# Patient Record
Sex: Female | Born: 1945 | Race: White | Hispanic: No | Smoking: Never smoker
Health system: Southern US, Community
[De-identification: ages and names within clinical notes are randomized; demographics above are authoritative.]

## PROBLEM LIST (undated history)

## (undated) DIAGNOSIS — G309 Alzheimer's disease, unspecified: Secondary | ICD-10-CM

## (undated) DIAGNOSIS — M948X9 Other specified disorders of cartilage, unspecified sites: Secondary | ICD-10-CM

## (undated) DIAGNOSIS — G250 Essential tremor: Secondary | ICD-10-CM

## (undated) DIAGNOSIS — F0280 Dementia in other diseases classified elsewhere without behavioral disturbance: Secondary | ICD-10-CM

## (undated) DIAGNOSIS — T8859XA Other complications of anesthesia, initial encounter: Secondary | ICD-10-CM

## (undated) DIAGNOSIS — M6530 Trigger finger, unspecified finger: Secondary | ICD-10-CM

## (undated) DIAGNOSIS — G243 Spasmodic torticollis: Secondary | ICD-10-CM

## (undated) DIAGNOSIS — N809 Endometriosis, unspecified: Secondary | ICD-10-CM

## (undated) DIAGNOSIS — E876 Hypokalemia: Secondary | ICD-10-CM

## (undated) DIAGNOSIS — F32A Depression, unspecified: Secondary | ICD-10-CM

## (undated) DIAGNOSIS — R251 Tremor, unspecified: Secondary | ICD-10-CM

## (undated) DIAGNOSIS — G252 Other specified forms of tremor: Secondary | ICD-10-CM

## (undated) DIAGNOSIS — M159 Polyosteoarthritis, unspecified: Secondary | ICD-10-CM

## (undated) DIAGNOSIS — E785 Hyperlipidemia, unspecified: Secondary | ICD-10-CM

## (undated) DIAGNOSIS — I10 Essential (primary) hypertension: Secondary | ICD-10-CM

## (undated) DIAGNOSIS — F329 Major depressive disorder, single episode, unspecified: Secondary | ICD-10-CM

## (undated) DIAGNOSIS — K219 Gastro-esophageal reflux disease without esophagitis: Secondary | ICD-10-CM

## (undated) DIAGNOSIS — T4145XA Adverse effect of unspecified anesthetic, initial encounter: Secondary | ICD-10-CM

## (undated) HISTORY — PX: TONSILLECTOMY: SUR1361

## (undated) HISTORY — PX: CATARACT EXTRACTION: SUR2

## (undated) HISTORY — DX: Depression, unspecified: F32.A

## (undated) HISTORY — DX: Dementia in other diseases classified elsewhere, unspecified severity, without behavioral disturbance, psychotic disturbance, mood disturbance, and anxiety: F02.80

## (undated) HISTORY — PX: TOTAL ABDOMINAL HYSTERECTOMY: SHX209

## (undated) HISTORY — DX: Tremor, unspecified: R25.1

## (undated) HISTORY — DX: Hypokalemia: E87.6

## (undated) HISTORY — PX: TOTAL KNEE ARTHROPLASTY: SHX125

## (undated) HISTORY — PX: COLON SURGERY: SHX602

## (undated) HISTORY — DX: Alzheimer's disease, unspecified: G30.9

## (undated) HISTORY — PX: APPENDECTOMY: SHX54

## (undated) HISTORY — PX: WISDOM TOOTH EXTRACTION: SHX21

## (undated) HISTORY — PX: JOINT REPLACEMENT: SHX530

## (undated) HISTORY — DX: Essential tremor: G25.0

## (undated) HISTORY — DX: Major depressive disorder, single episode, unspecified: F32.9

## (undated) HISTORY — DX: Other specified disorders of cartilage, unspecified sites: M94.8X9

## (undated) HISTORY — DX: Polyosteoarthritis, unspecified: M15.9

## (undated) HISTORY — DX: Gastro-esophageal reflux disease without esophagitis: K21.9

## (undated) HISTORY — DX: Other specified forms of tremor: G25.2

## (undated) HISTORY — DX: Endometriosis, unspecified: N80.9

## (undated) HISTORY — PX: CHOLECYSTECTOMY: SHX55

## (undated) HISTORY — PX: FOOT SURGERY: SHX648

---

## 1998-12-02 ENCOUNTER — Encounter: Payer: Self-pay | Admitting: *Deleted

## 1998-12-02 ENCOUNTER — Encounter: Admission: RE | Admit: 1998-12-02 | Discharge: 1998-12-02 | Payer: Self-pay | Admitting: *Deleted

## 1999-03-13 ENCOUNTER — Ambulatory Visit (HOSPITAL_BASED_OUTPATIENT_CLINIC_OR_DEPARTMENT_OTHER): Admission: RE | Admit: 1999-03-13 | Discharge: 1999-03-13 | Payer: Self-pay | Admitting: Orthopedic Surgery

## 2000-01-28 ENCOUNTER — Encounter: Payer: Self-pay | Admitting: Orthopedic Surgery

## 2000-01-29 ENCOUNTER — Inpatient Hospital Stay (HOSPITAL_COMMUNITY): Admission: RE | Admit: 2000-01-29 | Discharge: 2000-02-03 | Payer: Self-pay | Admitting: Orthopedic Surgery

## 2002-02-02 LAB — HM COLONOSCOPY

## 2002-10-10 ENCOUNTER — Encounter: Payer: Self-pay | Admitting: Surgery

## 2002-10-16 ENCOUNTER — Observation Stay (HOSPITAL_COMMUNITY): Admission: RE | Admit: 2002-10-16 | Discharge: 2002-10-17 | Payer: Self-pay | Admitting: Surgery

## 2002-10-16 ENCOUNTER — Encounter (INDEPENDENT_AMBULATORY_CARE_PROVIDER_SITE_OTHER): Payer: Self-pay | Admitting: Specialist

## 2003-04-13 ENCOUNTER — Ambulatory Visit (HOSPITAL_COMMUNITY): Admission: RE | Admit: 2003-04-13 | Discharge: 2003-04-13 | Payer: Self-pay | Admitting: Gastroenterology

## 2008-01-31 ENCOUNTER — Inpatient Hospital Stay (HOSPITAL_COMMUNITY): Admission: RE | Admit: 2008-01-31 | Discharge: 2008-02-03 | Payer: Self-pay | Admitting: Orthopedic Surgery

## 2008-09-11 ENCOUNTER — Encounter
Admission: RE | Admit: 2008-09-11 | Discharge: 2008-09-11 | Payer: Self-pay | Admitting: Physical Medicine and Rehabilitation

## 2009-11-22 LAB — HM DEXA SCAN

## 2010-05-19 LAB — CBC
HCT: 26.1 % — ABNORMAL LOW (ref 36.0–46.0)
Hemoglobin: 9.1 g/dL — ABNORMAL LOW (ref 12.0–15.0)
MCHC: 34.9 g/dL (ref 30.0–36.0)
MCV: 92.4 fL (ref 78.0–100.0)
Platelets: 204 10*3/uL (ref 150–400)
RBC: 2.82 MIL/uL — ABNORMAL LOW (ref 3.87–5.11)
RDW: 14.5 % (ref 11.5–15.5)
WBC: 4.6 10*3/uL (ref 4.0–10.5)

## 2010-06-17 NOTE — Op Note (Signed)
Alyssa Macdonald, Alyssa Macdonald               ACCOUNT NO.:  [id number]   MEDICAL RECORD NO.:  [id number]          PATIENT TYPE:  INP   LOCATION:  0005                         FACILITY:  [facility]   PHYSICIAN:  Deidre Ala, M.D.    DATE OF BIRTH:  06-06-1945   DATE OF PROCEDURE:  01/31/2008  DATE OF DISCHARGE:                               OPERATIVE REPORT   PREOPERATIVE DIAGNOSIS:  End-stage degenerative joint disease, right  knee.   POSTOPERATIVE DIAGNOSIS:  End-stage degenerative joint disease, right  knee.   PROCEDURE:  Right total knee arthroplasty using cemented DePuy  components, LCS type with rotating platform and MVT revision stem.   SURGEON:  1. Charlesetta Shanks, M.D.   ASSISTANT:  Phineas Semen, PA-C.   ANESTHESIA:  Spinal.   CULTURES:  None.   DRAINS:  Two medium Hemovacs to self suction.   ESTIMATED BLOOD LOSS:  Less than 100 mL, replaced without.   TOURNIQUET TIME:  64 minutes.   PATHOLOGIC FINDINGS AND HISTORY:  Alyssa Macdonald is a 64 year old female with  end-stage DJD right knee, painful to the point of incapacitation.  The  left knee is status post about 5 years a total knee and has been  reasonably successful for her.  At surgery the right knee had end-stage  tricompartmental disease.  We did fit her with a standard plus right  femur, a standard plus rotating platform, 10 mm oval dome three peg all-  poly patella, 32 mm and MVT revision cemented tray size 4 and a  universal stem fluted 75 x 14.  We got full extension with flexion to  about 120 degrees with excellent ligament balance.   DESCRIPTION OF PROCEDURE:  With adequate anesthesia obtained using  spinal technique and 2 grams Ancef given IV prophylaxis, the patient was  placed in the supine position.  The right lower extremity was prepped  from the toes to the tourniquet in standard fashion.  After standard  prepping and draping, Esmarch examination was used and the tourniquet  was let up to 350 mmHg.  A right knee  median parapatellar skin incision  was followed by a median parapatellar retinacular incision.  The  incision was deepened sharply with a knife and hemostasis obtained using  the Bovie electrocoagulator.  Dissection to the retinaculum which was  incised.  The patella was then everted.  The fat pad excised, both  menisci and the cruciates.  We then amputated the tibial spines and  proceeded to ream up to a 16 for the intramedullary guide, 0 degree on  the tibia.  The tibial cutting guide was put in place with the  appropriate alignment and then cut made.  I cut 2.5 more mm because I  felt the flexion gap would be tight.  I then sized the femur to a  standard plus and placed the intramedullary guide.  I set it with a 10.5  C clamp and pinned it.  I then made the anterior-posterior cuts and fit  the 10 mm in flexion.  I then placed a 4 degree distal valgus femoral  cutting jig in place  and made that cut on the first mark, allowing 10 mm  in flexion and extension with the flexion/extension gap.  The finishing  guide was then placed in the femur with anterior-posterior chamfer cuts,  as well as a notch cut made.  I then exposed the tibia and reamed down  with a conical reamer.  I felt the 14 stem fit best.  I placed a trial  and impacted it with the keel punch.  I then trialed a 10-mm insert  after articulating the distal femur with the femoral trial.  The knee  had excellent fit and fill with full range of motion.  I then sized the  patella to a 25.  I used the round reamer to cut it down 8-mm to fit for  a 32 button, placed the three peg drill guide, made those holes and then  placed the patellar component on for trialing.  All trial components  were then removed.  I thoroughly jet lavaged the knee and checked the  components for sizing as they came on the field.  The tibial stem was  then assembled on the tibia.  We then mixed cement in a cement gun with  vancomycin.  We then cemented on the  tibial component, impacted it and  removed excess cement.  We then placed a rotating platform 10 mm.  We  then cemented on the femoral component, impacted it and removed excess  cement and then held the knee in full extension and removed excess  cement.  We then cemented on the patellar component, impacted it,  removed excess cement and held it until the cement cured.  When the  cement had cured, again jet lavage was carried out on the remainder of  the irrigant.  We then let the tourniquet down.  Bleeding points were  cauterized.  FloSeal was placed in the wound.  Hemovac drains were  placed in the medial and lateral gutter and brought out the  superolateral portal.  The wound was then closed in layers with #1  Vicryl on the retinaculum, 0 and 2-0 Vicryl on the subcu and skin  staples.  Hemovac was hooked up to self suction.  A bulky sterile  compressive dressing was applied with Ace and knee immobilizer.  The  patient having tolerated the procedure well was awakened and taken to  recovery room in satisfactory condition to be admitted for routine  postoperative care, CPM and analgesia.      Deidre Ala, M.D.  Electronically Signed     VEP/MEDQ  D:  01/31/2008  T:  01/31/2008  Job:  [id number]   cc:   Birdena Jubilee, MD  Fax: [phone]

## 2010-06-17 NOTE — Discharge Summary (Signed)
Alyssa Macdonald, Alyssa Macdonald               ACCOUNT NO.:  [id number]   MEDICAL RECORD NO.:  [id number]          PATIENT TYPE:  INP   LOCATION:  1619                         FACILITY[facility]   PHYSICIAN:  Deidre Ala, M.D.    DATE OF BIRTH:  [birth year]   DATE OF ADMISSION:  01/31/2008  DATE OF DISCHARGE:  02/03/2008                               DISCHARGE SUMMARY   FINAL DIAGNOSES:  1. Degenerative joint disease right knee with chronic pain.  2. Postoperative blood loss anemia, stable.  3. Hypertension.  4. Hyperlipidemia.  5. History of pancreatitis.   PROCEDURE:  On January 31, 2008, right total knee arthroplasty.   SURGEON:  1. Charlesetta Shanks, M.D.   HISTORY:  This is a 65 year old Caucasian female, patient of Dr. Ollen Gross  who is followed by him for degenerative joint disease of the right knee  with chronic pain.  She had failed conservative management and was felt  to be ready and stable for surgery.  On January 31, 2008, the patient  was taken to the operating room where  she underwent a total knee  arthroplasty on the right knee with Dupuy LCS with Smith&Nephew P-  stemand rotating platform.  The patient tolerated the procedure well.  No intraoperative complications occurred.  Postoperatively, the patient  did very well.  No untoward events occurred during her stay.  She did  suffer from postop blood loss anemia but required no blood products.  She was started on medication as she was taking at home.  Her pain was  controlled well with PCA pump with morphine and oxycodone.  She was  working with physical therapy.  She was up and out of bed.  By the third  postoperative day, she was able to get out of bed without assistance and  at this time she will be prepared for discharge.   DISCHARGE MEDICATIONS:  1. Lovenox 30 mg 1 subcu q.24 h.times 10 days.  2. Percocet 5/325 one-to-two p.o. q.4-6 h., p.r.n. for pain, #60 with      no refills.  3. Robaxin 750 one p.o. q.i.d. p.r.n. for  muscle spasm, #40 with no      refills.  4. She was to continue on her other medications as she was taking at      home which was hydrochlorothiazide 25 mg 1/2 tablet p.o. daily.  5. Propranolol 10 mg 1 p.o. daily.  6. Lipitor 10 mg 1 p.o. daily.  7. Potassium chloride 20 mEq 1/2 tablet one time a day.  8. Fosamax 70 mg one time per week.  9. Lovaza 1 gram two times a day.  10.Fish oil.  11.She was to continue to take aspirin 81 mg daily.  12.Omeprazole 20 mg two times a day.  13.Amitriptyline 25 mg p.o. h.s. for leg pain.   ALLERGIES:  NO KNOWN DRUG ALLERGIES.   The patient is subsequently scheduled for discharge on February 03, 2008.  At this time, her lab work looked good.  Her last hemoglobin and  hematocrit on the day before discharge was 10.3 and 30.2.  WBC was 9.9.  Platelets 295,000.  She had mild hyponatremia which was 133.  Potassium  3.7, chloride 100, CO2 of 27, BUN 8, creatinine 0.67, glucose 136,  calcium 8.7.   The patient is doing well and will be discharged home on February 03, 2008, in satisfactory and stable condition.  She is to follow up with  Dr. Renae Fickle in 10 days.      Phineas Semen, Wynonia Hazard, M.D.  Electronically Signed    CL/MEDQ  D:  02/02/2008  T:  02/02/2008  Job:  [id number]

## 2010-06-20 NOTE — Op Note (Signed)
[facility]  Patient:    Alyssa Macdonald, Alyssa Macdonald                      MRN: [id number] Proc. Date: 03/13/99 Adm. Date:  [id number] Attending:  Drema Pry CC:         Jearld Adjutant, M.D.             Jamesetta Geralds, M.D.                           Operative Report  PREOPERATIVE DIAGNOSIS:  Left knee medial meniscus tear.  POSTOPERATIVE DIAGNOSIS: 1. Degenerative posterior horn medial meniscus tear with displaceable flap. 2. Degenerative joint disease grade 3 to 4 medial femoral condyle, medial tibial    plateau, lateral femoral condyle and posterior patella. 3. Large synovitic and fibrous plicas medial and lateral and _________    synovitis. 4. Tight lateral retinaculum. 5. Stretched anterior cruciate ligament with end point. 6. Degenerative fraying, lateral meniscus.  OPERATION PERFORMED: 1. Left knee operative arthroscopy with posterior medial meniscectomy. 2. Shaving, lateral meniscus. 3. Abrasion chondroplasty and ablation medial femoral condyle, medial tibial    plateau, posterior patella, lateral femoral condyle. 4. Synovectomy, medial and lateral plicas and synovium. 5. Shrinking anterior cruciate ligament with ablation probe. 6. Arthroscopic lateral retinacular release.  SURGEON:  Jearld Adjutant, M.D.  ASSISTANT:  Currie Paris. Thedore Mins.  ANESTHESIA:  General with LMA.  CULTURES:  None.  DRAINS:  None.  ESTIMATED BLOOD LOSS:  Minimal.  TOURNIQUET TIME:  Without.  PATHOLOGIC FINDINGS AND HISTORY:  Alyssa Macdonald is a 65 year old female who was seen February 08, 1999 with a consultation at the request of Dr. Tery Sanfilippo at [facility] for evaluation of acute knee pain in her left knee present for ix weeks prior but worsening to the point where she can not ambulate on it.  She had had a knee immobilizer.  The pain is constant, hurt with certain movements. 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  She had tenderness in the  medial and lateral joint lines, Lachmans was negative, not a significant effusion.  X-ray showed degenerative changes media compartment narrowing which is mild but certainly a ong way from bone-on-bone, spurring off the tibial spine.  At that point, we felt she had DJD with a possible medial meniscus tear and Bakers cyst and we elected to inject the knee with cortisone and Marcaine.  We put in Neoprene knee sleeves and Mepergan Fortis for the severe pain.  She came back February 17, 1999.  She was getting a little popping and grinding but no locking, catching or giving way and overall felt much better.  We felt she had improvement of her pain after cortisone injection but did feel she could have a degenerative medial meniscus tear with  Bakers cyst present even though better clinically.  We put her on Celebrex, brought her back March 03, 1999.  The knee was still hurting with any type of  lateral motion or twisting motion medially.  No catching or locking but does have a sharp pain medially when she squats, which she really can not do.  She has been  working as a Diplomatic Services operational officer at [employer] but states that even doing light duty work she was having difficulty.  Exam was consistent with worsening of symptoms of probable degenerative medial meniscus tear and Bakers cyst.  At this point we discussed  options and she desired to proceed with arthroscopy.  At surgery, we found a posterior medial meniscus tear with cleavage between the far posterior horn and the rest of the medial meniscus that was displaceable by probing into the joint. This was her most symptomatic site.  She had grade 3 to 4 changes on the posterior medial femoral condyle, grade 2 to 3 changes on the medial tibial plateau.  Grade 3 changes on a dime-sized area on the lateral femoral condyle. The medial femoral condyle was about half-dollar size.  There was some spurring on he medial femoral condylar  runner.  The trochlea actually looked fairly good with nly grade 1 changes.  She had pouch synovitis and parapatellar synovitis that was fairly extensive in the medial and lateral gutters with underlying plicas.  She had a tight lateral retinaculum and her posterior patella was grade 3 to 4 degenerative joint disease over the most of the lateral 75% of the posterior patella.  The ACL was intact but stretched forward about 5 mm, then had an end point.  She did have degenerative fraying of the lateral meniscus.  Therefore, we did the procedures as above with posterior medial meniscectomy back to a far stable rim and tapering leaving the anterior medial meniscus.  We also shaved off some synovium from the anterior medial meniscus.  The lateral meniscus was shaved.  We then did abrasion chondroplasties and ablation smoothing of the medial femoral condyle, medial tibial plateau, lateral femoral condyle and posterior patella.  We did a lateral retinacular retinacular release, shaved out the synovitis, medial and lateral gutters back to the side wall including the underlying fibrous band and did the  ablator on shrink to tighten up the ACL.  DESCRIPTION OF PROCEDURE:  After general anesthesia was obtained using LMA technique, the patient was placed in supine position.  The left leg was fixed in a legholder.  After standard prepping and draping of the left knee from the malleoli to the leg holder, a superolateral inflow portal was made.  The knee was insufflated with normal saline with the arthroscopic pump.  Medial and lateral scope portals were then made.  The joint was thoroughly inspected and probed. e isolated the posterior medial meniscus and used basket and shaver and ablator to saucerize it completely back to a stable rim with no unstable flaps including the far posterior horn down around the posterior one quarter, then tapering it around from there to the front.  Synovitis  was shaved off the anterior medial meniscus.  I then shaved and lightly used the ablator on low on the medial femoral condyle,  medial tibial plateau to smooth it.  I then shaved out the medial gutter synovitis back to the side wall and lysed the medial band and cauterized bleeding points.  The lateral meniscus was then shaved.  I shaved on the lateral femoral condyle defect and used the ablator there to smooth it.  I reversed portals, shaved out the lateral gutter synovitis and shaved and used the ablator on the posterior patella and did an arthroscopic lateral retinacular release from the vastus lateralis to the joint line nicely decompressive with the patellofemoral joint on the lateral side improving tracking.  I then used the ablator on the ACL, shrinking that down. When I was satisfied with resection and the procedure, the knee was irrigated through the scope and 0.5% Marcaine was injected in and about the portals.  The  portals were left open.  A bulky sterile compressive  dressing was applied with lateral foam pad for tamponade.  An E-Z wrap was placed over Ace compressive dressing. The patient then having tolerated the procedure well was awakened and  taken to the recovery room in satisfactory condition for routine postoperative are to be discharged per outpatient routine and told to call the office for appointment for recheck on Saturday, given Tylox for pain. DD:  03/13/99 TD:  03/13/99 Job: [id number]

## 2010-06-20 NOTE — Op Note (Signed)
NAME:  Alyssa Macdonald, Alyssa Macdonald                         ACCOUNT NO.:  [id number]   MEDICAL RECORD NO.:  [id number]                   PATIENT TYPE:  AMB   LOCATION:  DAY                                  FACILITY:  [facility]   PHYSICIAN:  Sandria Bales. Ezzard Standing, M.D.               DATE OF BIRTH:  [birth year]   DATE OF PROCEDURE:  10/16/2002  DATE OF DISCHARGE:                                 OPERATIVE REPORT   PREOPERATIVE DIAGNOSIS:  Chronic cholecystitis with cholelithiasis.   POSTOPERATIVE DIAGNOSIS:  Chronic cholecystitis with cholelithiasis, with  extensive adhesions to the midline from her prior abdominal surgery.   PROCEDURE:  Laparoscopic cholecystectomy with intraoperative cholangiogram.   SURGEON:  Sandria Bales. Ezzard Standing, M.D.   FIRST ASSISTANT:  Sharlet Salina T. Hoxworth, M.D.   ANESTHESIA:  General endotracheal.   ESTIMATED BLOOD LOSS:  Minimal.   INDICATION FOR PROCEDURE:  Ms. Seki is a 65 year old white female who has  symptomatic cholelithiasis.  She has had a significant prior history of a  prior hysterectomy in 1987, and then she had a small bowel resection in  1988.  I think this has all been blamed on endometriosis.  She now comes for  attempt at a laparoscopic cholecystectomy.  The indications and potential  complications were explained to the patient.  Potential complications  include but are not limited to infection, bleeding, open bowel surgery, and  bowel and common duct injuries.   The patient was placed in a supine position, given 1 g of Ancef at the  initiation of the procedure, had the PAS stockings in place.  Her abdomen  was prepped with Betadine solution and sterilely draped.  I went  infraumbilical because she had a long midline incision.  I was able to get  into the abdominal cavity without a lot of difficulty.  I placed a 0 degree  scope through a 12 mm Hasson trocar.  The Hasson trocar was secured with a 0  Vicryl suture.  I got into the right upper quadrant, which  actually was  surprisingly free of adhesions though she had extensive adhesions to her  midline under her prior incision area.   The gallbladder was grasped, rotated cephalad, and three additional trocars  placed, a 10 mm Ethicon trocar in the subxiphoid location, a 5 mm Ethicon  trocar in the right midsubcostal, and a 5 mm Ethicon trocar in the right  lateral subcostal location.  The gallbladder was grasped, rotated cephalad.  There were some adhesions of the gallbladder to the duodenum.  There was no  other finding in the right upper quadrant that I could see.  Both the right  and left lobe of the liver were unremarkable.   The gallbladder was grasped and rotated cephalad.  The cystic duct and  cystic artery were identified.  The cystic artery was triply endoclipped and  divided.  The cystic duct was identified, a clip placed  on the gallbladder  side of the cystic duct.  An intraoperative cholangiogram was obtained.   The intraoperative cholangiogram was obtained using a cut-off Taut catheter  inserted through a 14-gauge Jelco catheter in line of the cut cystic duct.   The Taut catheter was then secured with a clip and using approximately 6 mL  of half-strength Hypaque solution, I shot an intraoperative cholangiogram  that showed free flow of contrast down the cystic duct, into the common bile  duct, into the duodenum, and up the hepatic radicles.  There was no  obstruction, no mass, no leak, and this was felt to be a normal  intraoperative cholangiogram.   Unfortunately, the x-ray techs did not save films, save the study;  therefore, I had no way of making a hard copy, but I felt comfortable enough  that the films looked entire normal to me.  I saw no reason for repeating  the exam.   The Taut catheter was then removed.  The cystic duct was triply endoclipped  and divided.  The gallbladder was then sharply and bluntly dissected from  the gallbladder bed.  There were a couple of  more clips I had to put in to  take some accessory vessels, but there was no bleeding, no bile leak either  from the triangle of Calot or the gallbladder bed as the gallbladder was  dissected off the gallbladder bed.  Prior to complete vision of the  gallbladder from the gallbladder bed, we re-examined the gallbladder bed and  the triangle of Calot and saw no bleeding or bile leak.  The gallbladder was  then divided, placed in an EndoCatch bag, and delivered through the  umbilicus.   I then irrigated the abdomen out with about 500 mL of saline.  I closed the  umbilical port with a 0 Vicryl suture, the skin at each port was closed with  a 5-0 Monocryl suture, painted with tincture of Benzoin, and steri-stripped.  The gallbladder was sent to pathology.  The patient tolerated the procedure  well, was transported to the recovery room in good condition.  The sponge  and needle count were correct at the end of the case.                                               Sandria Bales. Ezzard Standing, M.D.    DHN/MEDQ  D:  10/16/2002  T:  10/16/2002  Job:  [id number]   cc:   Birdena Jubilee, MD  [address]  [place]  [place] [address]  Fax: [phone]

## 2010-06-20 NOTE — Op Note (Signed)
NAME:  Alyssa Macdonald, GREEN                         ACCOUNT NO.:  [id number]   MEDICAL RECORD NO.:  [id number]                   PATIENT TYPE:  AMB   LOCATION:  ENDO                                 FACILITY:  [facility]   PHYSICIAN:  Anselmo Rod, M.D.               DATE OF BIRTH:  [birth year]   DATE OF PROCEDURE:  04/13/2003  DATE OF DISCHARGE:                                 OPERATIVE REPORT   PROCEDURE:  Screening colonoscopy.   ENDOSCOPIST:  Anselmo Rod, M.D.   INSTRUMENT:  Pediatric adjustable Olympus colonoscope.   INDICATIONS FOR PROCEDURE:  A 65 year old white female with a history of  bowel resection  (no records are available at this time) done for  endometriosis. Presents with rectal bleeding and change in bowel habits. To  rule out any polyps, masses, etc.   PRE-PROCEDURE PREPARATION:  Informed consent was procured from the patient.  Patient fasted for 8 hours prior to the procedure and prepped with a bottle  of magnesium citrate and a gallon of GoLYTELY the night prior to the  procedure.  Pre-procedure physical:  Patient had stable vital signs, neck  supple, chest clear to auscultation, S1/S2 regular, respirations regular,  abdomen soft with normal bowel sounds.   DESCRIPTION OF PROCEDURE:  The patient was placed in the left lateral  decubitus position, sedated with 70 mg of Demerol and 7 mg of Versed  intravenously in slow, incremental doses.  Once the patient was adequately  sedated and maintained on low flow oxygen, continuous cardiac monitoring;  the Olympus video colonoscope was advanced from the rectum to the cecum.  The appendiceal orifice and the ileocecal valve were visualized and  photographed.  A couple of scattered diverticula were seen in the sigmoid  colon.  Small internal hemorrhoids were appreciated on retroflexion in the  rectum.  No masses or polyps were identified.  The patient tolerated the  procedure well without  immediate complications.   IMPRESSION:  1. Small, non-bleeding internal hemorrhoids.  2. Few scattered diverticula in the sigmoid colon.  3. Normal descending, transverse and right colon without diverticulum.   RECOMMENDATIONS:  1. Continue on a high fiber diet.  2. Repeat colorectal cancer screening in the next 10 years unless the     patient develops abnormal symptoms in the interim.  3. Outpatient follow up in the next 2 weeks for further recommendations.                                              Anselmo Rod, M.D.   JNM/MEDQ  D:  04/13/2003  T:  04/14/2003  Job:  [id number]   cc:   Ames Coupe, M.D.

## 2010-06-20 NOTE — Discharge Summary (Signed)
[place]. [facility]  Patient:    Alyssa Macdonald, Alyssa Macdonald                      MRN: [id number] Adm. Date:  [id number] Disc. Date: [id number] Attending:  Drema Pry Dictator:   Karie Schwalbe, P.A.C. CC:         Dr. Janey Greaser, [facility]. Pract.   Discharge Summary  DIAGNOSIS:  Left knee end-stage degenerative joint disease.  PROCEDURE:  Left total knee arthroplasty using cemented DePuy components with rotating platform by Jearld Adjutant, M.D., on January 29, 2000.  MEDICATIONS AT DISCHARGE: 1. Percocet for pain. 2. Coumadin per pharmacy dosing. 3. All medications at home as before.  DISPOSITION:  The patient is discharged to home with home health nursing and physical therapy with instructions for weightbearing as tolerated.  She will be maintained on Coumadin for a total of four weeks.  She was to return to the office for follow-up one and a half weeks after discharge for staple removal.  HOSPITAL COURSE:  This is a 65 year old female with a history of left knee degenerative joint disease with recurrent pain after arthroscopy and Hyalgan injections.  Prior to surgery she had pain at night, pain with every step and was unable to walk a city block.  X-ray showed bone on bone in the medial compartment of her left knee.  She was taken to the operating room on January 29, 2000, with total knee arthroplasty performed as above. Postoperative course was uneventful.  She was anticoagulated with Coumadin. She advanced well with physical therapy and occupational therapy.  She was able to be discharged home on postoperative day #5 with medications and instructions as above. DD:  03/06/00 TD:  03/08/00 Job: [id number]

## 2010-06-20 NOTE — Op Note (Signed)
[place]. [facility]  Patient:    Alyssa Macdonald, Alyssa Macdonald                      MRN: [id number] Adm. Date:  [id number] Attending:  Drema Pry                           Operative Report  NO DICTATION DD:  01/29/00 TD:  01/29/00 Job: [id number]

## 2010-06-20 NOTE — Op Note (Signed)
Alyssa Macdonald. [facility], [facility]  Patient:    Alyssa Macdonald, Alyssa Macdonald                      MRN: [id number] Proc. Date: 01/29/00 Adm. Date:  [id number] Attending:  Drema Pry CC:         Dr. Rosine Abe, [facility]  Alyssa Macdonald, M.D.   Operative Report  PREOPERATIVE DIAGNOSIS:  End-stage degenerative joint disease, left knee.  POSTOPERATIVE DIAGNOSIS:  End-stage degenerative joint disease, left knee.  PROCEDURE:  Left total knee arthroplasty using cemented DePuy components with rotating platform.  SURGEON:  Jearld Adjutant, M.D.  ASSISTANT:  Will _____, P.A.-C.  ANESTHESIA:  General endotracheal.  CULTURES:  None.  DRAINS:  Two medium Hemovacs to Autovac.  ESTIMATED BLOOD LOSS:  100 cc.  REPLACEMENT:  Without.  TOURNIQUET TIME:  1 hour 7 minutes.  PATHOLOGIC FINDINGS AND HISTORY:  Alyssa Macdonald first presented to me at the first of last year with knee pain.  Conservative management failed, and she was taken to the operating room March 13, 1999, where she underwent left knee operative arthroscopy with posterior medial meniscus tear, degenerative, debrided, and debridement of grade 3-4 DJD of the posterior medial femoral condyle.  She also had a somewhat stretched ACL but did have an end point. She was treated conservatively postoperatively and initially did well but continued to have pain later on.  Ultimately went with a series of Hyalgan injections, which ultimately failed with pain with every step, night pain, cannot walk more than a city block, and decided to go ahead with surgery. X-ray showed bone on bone in the medial compartment with an osteophyte, although very smooth and congruous.  At this point we had a discussion, and she decided November 24, 1999, to proceed with total knee arthroplasty over the Christmas holidays.  At surgery, the knee was much worse than x-rays or her arthroscopy showed, with bone on bone,  marked osteophytes in the medial compartment and actually throughout the joint.  Patellofemoral joint was totally degenerated.  We sized her to a standard plus femur, a standard plus patella, a large tibial tray with a 12.5 standard plus to large rotating platform.  Excellent ligament balance was obtained with full extension with flexion to 110 degrees.  We used two batches of cement with 750 mg of Zinacef per batch.  Will _____ assisted.  DESCRIPTION OF PROCEDURE:  With adequate anesthesia obtained using endotracheal technique, 1 g Ancef given for IV prophylaxis and another one at tourniquet let-down, the patient was placed in the supine position.  The left lower extremity was prepped from the toes to the tourniquet in the standard fashion.  After standard prepping and draping, Esmarch exsanguination was used, and the tourniquet was let to 350 mm and later 375 mmHg.  Median parapatellar incision was made and incision deepened sharply with a knife, and median parapatellar retinacular incision was made.  We then everted the patella, removed soft tissues from the proximal tibia, did a medial release, excised the menisci and ACL and the fat pad.  Synovitis of the pouch was also excised as well as the perimeter osteophytes on tibial, patellar, and femoral surface.  The knee was then further flexed, and the anterior tibial spines were sawed flat.  Gold-tip drill was then placed down the canal, and intramedullary guide put in place and the tibial cutting jig put in place just below the medial lip.  Tibial cut  was made.  We then sized to a standard plus femur with the appropriate rotation set with the 17.5 block.  It was pinned, anterior-posterior cuts made, and then it fit a 12.5 block, as sometimes happens with downsizing from the original lollipop flexion cutting block.  In any case, this is what we were aiming for, was a 12.5.  There was a little bit of give on varus-valgus and flexion, which  is what we like.  We then placed a 4 degree distal femoral cutting jig in place, set it back 1 from the standard, 1 less, and made our cut, and the 12.5 fit perfectly with slight, maybe 1 degree of extension.  We then placed the anterior-posterior chamfer cutting jig on the femur, made those cuts, as well as the notch cuts, and the far posterior cut.  We then sized the tibial tray to a large, placed our template for the central peg hole, pinned it, and made our peg hole cut.  We then placed the tibial tray in place, the 12.5 rotating platform, and the femoral component, and impacted it on and articulated the knee through a full range of motion.  We then calipered the patella to a 22.  We were replacing 10.  We set the guide on 14, cut to a 12-13, and then placed the three-peg hole guide in place and made our drill holes.  The patella was then placed on, and the knee tracked well without need for lateral retinacular release.  All trial components were then removed as the knee was thoroughly jet-lavaged.  We checked components for sizing as they came on the table and mixed cement, two batches with 750 mg of Zinacef per batch, in a cement gun.  We then exposed the proximal tibia and cemented in the tibial tray in the appropriate rotation, impacted it, removed excess cement.  The 12.5 rotating platform was then put in place.  We then articulated the rotating platform under the condyles of the femur and then cemented on the femoral component, impacted it, removed excess cement.  The knee was then hyperextended, squeezing out the last cement around the edges.  This was removed, and the knee was held in 30 degrees of flexion while the cement cured.  We then cemented on the patellar component, impacted it, removed excess cement.  The knee was then articulated through a range of motion, and we felt tracking to be good.  The tourniquet was let down, bleeding points were cauterized, and initial gram of  Ancef was given.  Hemovac drains were then placed in the medial and lateral gutters and  brought out the superior lateral portal.  This was hooked to Autovac.  The knee was then closed in layers with #1 Vicryl on the medial retinaculum, 0 and 2-0 Vicryl on the subcutaneous tissue, and skin staples.  Bulky sterile compressive dressing was applied with knee immobilizer, and the patient, having tolerated the procedure well, was taken to the recovery room in satisfactory condition with a normal needle, instrument, and sponge count. The patient was then taken to the recovery room in satisfactory condition for routine postoperative care and CPM, having gotten an epidural fentanyl catheter by anesthesia at discharge from the operating room. DD:  01/29/00 TD:  01/29/00 Job: [id number]

## 2010-11-07 LAB — URINALYSIS, ROUTINE W REFLEX MICROSCOPIC
Glucose, UA: NEGATIVE mg/dL
Hgb urine dipstick: NEGATIVE
Ketones, ur: NEGATIVE mg/dL
Protein, ur: NEGATIVE mg/dL
Urobilinogen, UA: 1 mg/dL (ref 0.0–1.0)

## 2010-11-07 LAB — CBC
HCT: 30.2 % — ABNORMAL LOW (ref 36.0–46.0)
HCT: 30.8 % — ABNORMAL LOW (ref 36.0–46.0)
Hemoglobin: 10.3 g/dL — ABNORMAL LOW (ref 12.0–15.0)
MCHC: 33.9 g/dL (ref 30.0–36.0)
MCHC: 34 g/dL (ref 30.0–36.0)
MCV: 93.2 fL (ref 78.0–100.0)
MCV: 94.1 fL (ref 78.0–100.0)
MCV: 94.4 fL (ref 78.0–100.0)
Platelets: 257 10*3/uL (ref 150–400)
Platelets: 317 10*3/uL (ref 150–400)
RBC: 3.21 MIL/uL — ABNORMAL LOW (ref 3.87–5.11)
RDW: 14.7 % (ref 11.5–15.5)
WBC: 7.2 10*3/uL (ref 4.0–10.5)
WBC: 9.9 10*3/uL (ref 4.0–10.5)

## 2010-11-07 LAB — BASIC METABOLIC PANEL
BUN: 11 mg/dL (ref 6–23)
BUN: 12 mg/dL (ref 6–23)
BUN: 8 mg/dL (ref 6–23)
CO2: 31 mEq/L (ref 19–32)
Calcium: 9.3 mg/dL (ref 8.4–10.5)
Chloride: 100 mEq/L (ref 96–112)
Chloride: 98 mEq/L (ref 96–112)
Creatinine, Ser: 0.75 mg/dL (ref 0.4–1.2)
Creatinine, Ser: 0.77 mg/dL (ref 0.4–1.2)
GFR calc non Af Amer: >60 mL/min (ref >60)
GFR calc non Af Amer: >60 mL/min (ref >60)
Glucose, Bld: 113 mg/dL — ABNORMAL HIGH (ref 70–99)
Potassium: 3.6 mEq/L (ref 3.5–5.1)
Potassium: 3.7 mEq/L (ref 3.5–5.1)
Potassium: 3.7 mEq/L (ref 3.5–5.1)
Sodium: 133 mEq/L — ABNORMAL LOW (ref 135–145)

## 2010-11-07 LAB — COMPREHENSIVE METABOLIC PANEL
AST: 26 U/L (ref 0–37)
Albumin: 3.5 g/dL (ref 3.5–5.2)
BUN: 22 mg/dL (ref 6–23)
Calcium: 9.6 mg/dL (ref 8.4–10.5)
Creatinine, Ser: 0.88 mg/dL (ref 0.4–1.2)
GFR calc Af Amer: >60 mL/min (ref >60)

## 2010-11-07 LAB — APTT: aPTT: 24 seconds (ref 24–37)

## 2010-11-07 LAB — HEMOGLOBIN AND HEMATOCRIT, BLOOD: Hemoglobin: 13.7 g/dL (ref 12.0–15.0)

## 2010-11-07 LAB — URINE CULTURE

## 2010-11-07 LAB — DIFFERENTIAL
Eosinophils Relative: 2 % (ref 0–5)
Lymphocytes Relative: 23 % (ref 12–46)
Lymphs Abs: 1.7 10*3/uL (ref 0.7–4.0)
Monocytes Absolute: 0.7 10*3/uL (ref 0.1–1.0)
Neutro Abs: 4.6 10*3/uL (ref 1.7–7.7)

## 2010-11-07 LAB — TYPE AND SCREEN
ABO/RH(D): A POS
Antibody Screen: NEGATIVE

## 2010-11-07 LAB — PROTIME-INR: Prothrombin Time: 13.1 seconds (ref 11.6–15.2)

## 2012-01-14 ENCOUNTER — Other Ambulatory Visit: Payer: Self-pay | Admitting: Orthopedic Surgery

## 2012-01-21 ENCOUNTER — Encounter (HOSPITAL_BASED_OUTPATIENT_CLINIC_OR_DEPARTMENT_OTHER): Payer: Self-pay | Admitting: *Deleted

## 2012-01-21 NOTE — Progress Notes (Signed)
Pt instructed npo p mn 12/29 x meds w sip of water.  To [facility] 12/30 @ -0735.  Needs istat, ekg on arrival 

## 2012-01-31 NOTE — H&P (Signed)
Alyssa Macdonald is an 65 y.o. female.   CC / Reason for Visit: Right long finger painful triggering HPI: This patient is a 66 year old female who presents for evaluation of a problem involving her right long finger.  She reports that as long as 5 years ago her finger was triggering.  She had an injection and ultimately advice to proceed surgically.  She has not had surgery.  She received another injection 3 weeks ago, and this one did not help much.  She does wish to proceed surgically.  Past Medical History  Diagnosis Date  . Hypertension   . Hyperlipidemia   . GERD (gastroesophageal reflux disease)   . Trigger finger   . Complication of anesthesia     slow to awaken  . Cervical dystonia     Past Surgical History  Procedure Date  . Joint replacement     bilateral  . Cholecystectomy   . Abdominal hysterectomy   . Appendectomy   . Colon surgery     bowel resection  . Tonsillectomy   . Foot surgery     History reviewed. No pertinent family history. Social History:  reports that she has never smoked. She does not have any smokeless tobacco history on file. She reports that she drinks alcohol. Her drug history not on file.  Allergies: No Known Allergies  No prescriptions prior to admission    No results found for this or any previous visit (from the past 48 hour(s)). No results found.  Review of Systems  All other systems reviewed and are negative.    Height 5\' 6"  (1.676 m), weight 88.451 kg (195 lb). Physical Exam  Exam:  Vitals: Refer to EMR. Constitutional:  WD, WN, NAD HEENT:  NCAT, EOMI Neuro/Psych:  Alert & oriented to person, place, and time; appropriate mood & affect Lymphatic: No generalized UE edema or lymphadenopathy Extremities / MSK:  Both UE are normal with respect to appearance, ranges of motion, joint stability, muscle strength/tone, sensation, & perfusion except as otherwise noted:  Right long finger tender over the A1 pulley with overt locking with  each flexion cycle.  PIP flexion contracture of 15  Assessment:  Right long finger typical trigger finger  Plan: I discussed these findings with the patient as well as the details of her trigger finger release.  She would like to proceed ideally after Christmas and before the first of the year.  We will attempt to accommodate her wishes.  The details of the operative procedure were discussed with the patient.  Questions were invited and answered.  In addition to the goal of the procedure, the risks of the procedure to include but not limited to bleeding; infection; damage to the nerves or blood vessels that could result in bleeding, numbness, weakness, chronic pain, and the need for additional procedures; stiffness; the need for revision surgery; and anesthetic risks, the worst of which is death, were reviewed.  No specific outcome was guaranteed or implied.  Informed consent was obtained.  Postop pain medicines were prescribed, but a followup appointment was not made.  Alyssa Macdonald A. 01/31/2012, 5:39 PM

## 2012-02-01 ENCOUNTER — Ambulatory Visit (HOSPITAL_BASED_OUTPATIENT_CLINIC_OR_DEPARTMENT_OTHER): Payer: Medicare Other | Admitting: Anesthesiology

## 2012-02-01 ENCOUNTER — Encounter (HOSPITAL_BASED_OUTPATIENT_CLINIC_OR_DEPARTMENT_OTHER): Payer: Self-pay | Admitting: Anesthesiology

## 2012-02-01 ENCOUNTER — Encounter (HOSPITAL_BASED_OUTPATIENT_CLINIC_OR_DEPARTMENT_OTHER): Payer: Self-pay | Admitting: *Deleted

## 2012-02-01 ENCOUNTER — Ambulatory Visit (HOSPITAL_BASED_OUTPATIENT_CLINIC_OR_DEPARTMENT_OTHER)
Admission: RE | Admit: 2012-02-01 | Discharge: 2012-02-01 | Disposition: A | Discharge status: Home / Self Care | Payer: Medicare Other | Source: Ambulatory Visit | Attending: Orthopedic Surgery | Admitting: Orthopedic Surgery

## 2012-02-01 ENCOUNTER — Other Ambulatory Visit: Payer: Self-pay

## 2012-02-01 ENCOUNTER — Encounter (HOSPITAL_BASED_OUTPATIENT_CLINIC_OR_DEPARTMENT_OTHER)
Admission: RE | Disposition: A | Discharge status: Home / Self Care | Payer: Self-pay | Source: Ambulatory Visit | Attending: Orthopedic Surgery

## 2012-02-01 DIAGNOSIS — M6530 Trigger finger, unspecified finger: Secondary | ICD-10-CM | POA: Insufficient documentation

## 2012-02-01 DIAGNOSIS — I10 Essential (primary) hypertension: Secondary | ICD-10-CM | POA: Insufficient documentation

## 2012-02-01 DIAGNOSIS — Z01810 Encounter for preprocedural cardiovascular examination: Secondary | ICD-10-CM | POA: Insufficient documentation

## 2012-02-01 HISTORY — DX: Trigger finger, unspecified finger: M65.30

## 2012-02-01 HISTORY — DX: Other complications of anesthesia, initial encounter: T88.59XA

## 2012-02-01 HISTORY — DX: Spasmodic torticollis: G24.3

## 2012-02-01 HISTORY — DX: Hyperlipidemia, unspecified: E78.5

## 2012-02-01 HISTORY — DX: Essential (primary) hypertension: I10

## 2012-02-01 HISTORY — DX: Adverse effect of unspecified anesthetic, initial encounter: T41.45XA

## 2012-02-01 HISTORY — PX: TRIGGER FINGER RELEASE: SHX641

## 2012-02-01 HISTORY — DX: Gastro-esophageal reflux disease without esophagitis: K21.9

## 2012-02-01 LAB — POCT I-STAT, CHEM 8
BUN: 17 mg/dL (ref 6–23)
Calcium, Ion: 1.25 mmol/L (ref 1.13–1.30)
Hemoglobin: 14.6 g/dL (ref 12.0–15.0)
Sodium: 142 mEq/L (ref 135–145)
TCO2: 24 mmol/L (ref 0–100)

## 2012-02-01 SURGERY — RELEASE, A1 PULLEY, FOR TRIGGER FINGER
Anesthesia: Monitor Anesthesia Care | Site: Finger | Laterality: Right | Wound class: Clean

## 2012-02-01 MED ORDER — HYDROMORPHONE HCL PF 1 MG/ML IJ SOLN
0.5000 mg | INTRAMUSCULAR | Status: DC | PRN
  Filled 2012-02-01: qty 1

## 2012-02-01 MED ORDER — PROPOFOL 10 MG/ML IV BOLUS
INTRAVENOUS | Status: DC | PRN
  Administered 2012-02-01: 20 mg via INTRAVENOUS

## 2012-02-01 MED ORDER — CEFAZOLIN SODIUM-DEXTROSE 2-3 GM-% IV SOLR
2.0000 g | INTRAVENOUS | Status: AC
  Administered 2012-02-01: 2 g via INTRAVENOUS
  Filled 2012-02-01: qty 50

## 2012-02-01 MED ORDER — ONDANSETRON HCL 4 MG/2ML IJ SOLN
INTRAMUSCULAR | Status: DC | PRN
  Administered 2012-02-01: 4 mg via INTRAVENOUS

## 2012-02-01 MED ORDER — LACTATED RINGERS IV SOLN
INTRAVENOUS | Status: DC
  Filled 2012-02-01: qty 1000

## 2012-02-01 MED ORDER — MIDAZOLAM HCL 5 MG/5ML IJ SOLN
INTRAMUSCULAR | Status: DC | PRN
  Administered 2012-02-01: 1 mg via INTRAVENOUS

## 2012-02-01 MED ORDER — PROMETHAZINE HCL 25 MG/ML IJ SOLN
6.2500 mg | INTRAMUSCULAR | Status: DC | PRN
  Filled 2012-02-01: qty 1

## 2012-02-01 MED ORDER — LIDOCAINE HCL 1 % IJ SOLN
INTRAMUSCULAR | Status: DC | PRN
  Administered 2012-02-01: 09:00:00 via INTRAMUSCULAR

## 2012-02-01 MED ORDER — OXYCODONE-ACETAMINOPHEN 5-325 MG PO TABS
1.0000 | ORAL_TABLET | ORAL | Status: DC | PRN
  Filled 2012-02-01: qty 2

## 2012-02-01 MED ORDER — LACTATED RINGERS IV SOLN
INTRAVENOUS | Status: DC
  Administered 2012-02-01 (×2): via INTRAVENOUS
  Filled 2012-02-01: qty 1000

## 2012-02-01 MED ORDER — HYDROCODONE-ACETAMINOPHEN 5-325 MG PO TABS
1.0000 | ORAL_TABLET | ORAL | Status: DC | PRN
  Filled 2012-02-01: qty 2

## 2012-02-01 MED ORDER — MEPERIDINE HCL 25 MG/ML IJ SOLN
6.2500 mg | INTRAMUSCULAR | Status: DC | PRN
  Filled 2012-02-01: qty 1

## 2012-02-01 MED ORDER — FENTANYL CITRATE 0.05 MG/ML IJ SOLN
INTRAMUSCULAR | Status: DC | PRN
  Administered 2012-02-01: 50 ug via INTRAVENOUS

## 2012-02-01 MED ORDER — FENTANYL CITRATE 0.05 MG/ML IJ SOLN
25.0000 ug | INTRAMUSCULAR | Status: DC | PRN
  Filled 2012-02-01: qty 1

## 2012-02-01 SURGICAL SUPPLY — 39 items
BANDAGE CO FLEX L/F 1IN X 5YD (GAUZE/BANDAGES/DRESSINGS) ×2 IMPLANT
BANDAGE COBAN STERILE 2 (GAUZE/BANDAGES/DRESSINGS) ×2 IMPLANT
BANDAGE CONFORM 2  STR LF (GAUZE/BANDAGES/DRESSINGS) ×2 IMPLANT
BLADE MINI RND TIP GREEN BEAV (BLADE) ×2 IMPLANT
BLADE SURG 15 STRL LF DISP TIS (BLADE) IMPLANT
BLADE SURG 15 STRL SS (BLADE)
BNDG ESMARK 4X9 LF (GAUZE/BANDAGES/DRESSINGS) IMPLANT
CHLORAPREP W/TINT 26ML (MISCELLANEOUS) ×2 IMPLANT
CORDS BIPOLAR (ELECTRODE) IMPLANT
COVER MAYO STAND STRL (DRAPES) ×2 IMPLANT
COVER TABLE BACK 60X90 (DRAPES) ×2 IMPLANT
DRAPE EXTREMITY T 121X128X90 (DRAPE) ×2 IMPLANT
DRAPE LG THREE QUARTER DISP (DRAPES) ×4 IMPLANT
DRAPE SURG 17X23 STRL (DRAPES) ×2 IMPLANT
DRSG EMULSION OIL 3X3 NADH (GAUZE/BANDAGES/DRESSINGS) ×2 IMPLANT
ELECT NEEDLE TIP 2.8 STRL (NEEDLE) IMPLANT
ELECT REM PT RETURN 9FT ADLT (ELECTROSURGICAL)
ELECTRODE REM PT RTRN 9FT ADLT (ELECTROSURGICAL) IMPLANT
GLOVE BIO SURGEON STRL SZ7 (GLOVE) ×4 IMPLANT
GLOVE BIO SURGEON STRL SZ7.5 (GLOVE) ×2 IMPLANT
GLOVE BIOGEL PI IND STRL 6.5 (GLOVE) ×1 IMPLANT
GLOVE BIOGEL PI IND STRL 7.0 (GLOVE) ×1 IMPLANT
GLOVE BIOGEL PI INDICATOR 6.5 (GLOVE) ×1
GLOVE BIOGEL PI INDICATOR 7.0 (GLOVE) ×1
GLOVE INDICATOR 7.5 STRL GRN (GLOVE) ×2 IMPLANT
GOWN PREVENTION PLUS LG XLONG (DISPOSABLE) ×4 IMPLANT
GOWN STRL REIN XL XLG (GOWN DISPOSABLE) ×2 IMPLANT
NEEDLE HYPO 25X1 1.5 SAFETY (NEEDLE) ×2 IMPLANT
NS IRRIG 500ML POUR BTL (IV SOLUTION) ×2 IMPLANT
PACK BASIN DAY SURGERY FS (CUSTOM PROCEDURE TRAY) ×2 IMPLANT
PENCIL BUTTON HOLSTER BLD 10FT (ELECTRODE) IMPLANT
SPONGE GAUZE 4X4 12PLY (GAUZE/BANDAGES/DRESSINGS) ×2 IMPLANT
STOCKINETTE 4X48 STRL (DRAPES) IMPLANT
SUT VICRYL RAPIDE 4/0 PS 2 (SUTURE) ×2 IMPLANT
SYR BULB 3OZ (MISCELLANEOUS) ×2 IMPLANT
SYR CONTROL 10ML LL (SYRINGE) ×2 IMPLANT
TOWEL OR 17X24 6PK STRL BLUE (TOWEL DISPOSABLE) ×2 IMPLANT
UNDERPAD 30X30 INCONTINENT (UNDERPADS AND DIAPERS) ×2 IMPLANT
WATER STERILE IRR 500ML POUR (IV SOLUTION) ×2 IMPLANT

## 2012-02-01 NOTE — Interval H&P Note (Signed)
History and Physical Interval Note:  02/01/2012 7:27 AM  Alyssa Macdonald  has presented today for surgery, with the diagnosis of RIGHT LONG FINGER TRIGGER FINGER  The various methods of treatment have been discussed with the patient and family. After consideration of risks, benefits and other options for treatment, the patient has consented to  Procedure(s) (LRB) with comments: RELEASE TRIGGER FINGER/A-1 PULLEY (Right) - RIGHT LONG FINGER  TRIGGER FINGER RELEASE as a surgical intervention .  The patient's history has been reviewed, patient examined, no change in status, stable for surgery.  I have reviewed the patient's chart and labs.  Questions were answered to the patient's satisfaction.     [name]

## 2012-02-01 NOTE — Op Note (Signed)
02/01/2012  9:10 AM  PATIENT:  Francee Nodal  66 y.o. female  PRE-OPERATIVE DIAGNOSIS:  Right long finger stenosing tenosynovitis  POST-OPERATIVE DIAGNOSIS:  Same  PROCEDURE:  Right long finger trigger release and tendon debridement  SURGEON: Cliffton Asters. Janee Morn, MD  PHYSICIAN ASSISTANT: None  ANESTHESIA:  local and MAC  SPECIMENS:  None  DRAINS:   None  PREOPERATIVE INDICATIONS:  MALISSIA RABBANI is a  66 y.o. female with a diagnosis of right long finger stenosing tenosynovitis who failed conservative measures and elected for surgical management.    The risks benefits and alternatives were discussed with the patient preoperatively including but not limited to the risks of infection, bleeding, nerve injury, cardiopulmonary complications, the need for revision surgery, among others, and the patient verbalized understanding and consented to proceed.  OPERATIVE IMPLANTS: None  OPERATIVE FINDINGS: Moderately frayed superficial flexor tendon  OPERATIVE PROCEDURE:  After receiving prophylactic antibiotics, the patient was escorted to the operative theatre and placed in a supine position.  A surgical "time-out" was performed during which the planned procedure, proposed operative site, and the correct patient identity were compared to the operative consent and agreement confirmed by the circulating nurse according to current facility policy. A mixture of lidocaine and Marcaine bearing epinephrine was instilled into the planned operative site.  Following application of a tourniquet to the operative extremity, the exposed skin was prepped with Chloraprep and draped in the usual sterile fashion.  The limb was exsanguinated with an Esmarch bandage and the tourniquet inflated to approximately higher than systolic BP.  An oblique incision was made over the A1 pulley of the right long finger. The skin was incised sharply with a scalpel. Spreading dissection was carried more deeply with a  hemostat. The distal flap was elevated and the A1 pulley incised with scissors. There was some crossing pins across the tendon more proximally is a were also incised. The tendons were then pulled into the field where there was moderate fraying of the superficial tendon particularly on the radial slip. This was debrided with scissors. The tendons were then returned to the bed. The patient was asked to flex and extend the digit no triggering was observed. This was confirmed by the patient. The wound is copiously irrigated, the tourniquet deflated, no additional hemostasis wasn't necessary. The skin was closed with 4-0 Vicryl Rapide interrupted sutures. She was taken to recovery room.  DISPOSITION: She'll be discharged home today with typical postop instructions returning in 10-15 days for reassessment.

## 2012-02-01 NOTE — Anesthesia Postprocedure Evaluation (Signed)
  Anesthesia Post-op Note  Patient: Alyssa Macdonald  Procedure(s) Performed: Procedure(s) (LRB): RELEASE TRIGGER FINGER/A-1 PULLEY (Right)  Patient Location: PACU  Anesthesia Type: MAC  Level of Consciousness: awake and alert   Airway and Oxygen Therapy: Patient Spontanous Breathing  Post-op Pain: mild  Post-op Assessment: Post-op Vital signs reviewed, Patient's Cardiovascular Status Stable, Respiratory Function Stable, Patent Airway and No signs of Nausea or vomiting  Last Vitals:  Filed Vitals:   02/01/12 0945  BP: 134/60  Pulse: 62  Temp:   Resp: 16    Post-op Vital Signs: stable   Complications: No apparent anesthesia complications

## 2012-02-01 NOTE — Anesthesia Preprocedure Evaluation (Addendum)
Anesthesia Evaluation    History of Anesthesia Complications (+) PROLONGED EMERGENCE  Airway Mallampati: II TM Distance: >3 FB Neck ROM: Full    Dental No notable dental hx.    Pulmonary  breath sounds clear to auscultation  Pulmonary exam normal       Cardiovascular hypertension, Pt. on medications Rhythm:Regular Rate:Normal     Neuro/Psych    GI/Hepatic GERD-  Medicated and Controlled,  Endo/Other    Renal/GU      Musculoskeletal   Abdominal   Peds  Hematology   Anesthesia Other Findings   Reproductive/Obstetrics                         Anesthesia Physical Anesthesia Plan  ASA: II  Anesthesia Plan: MAC   Post-op Pain Management:    Induction:   Airway Management Planned: Simple Face Mask  Additional Equipment:   Intra-op Plan:   Post-operative Plan:   Informed Consent: I have reviewed the patients History and Physical, chart, labs and discussed the procedure including the risks, benefits and alternatives for the proposed anesthesia with the patient or authorized representative who has indicated his/her understanding and acceptance.   Dental advisory given  Plan Discussed with: CRNA  Anesthesia Plan Comments:         Anesthesia Quick Evaluation

## 2012-02-01 NOTE — Transfer of Care (Signed)
Immediate Anesthesia Transfer of Care Note  Patient: Alyssa Macdonald  Procedure(s) Performed: Procedure(s) (LRB): RELEASE TRIGGER FINGER/A-1 PULLEY (Right)  Patient Location: PACU  Anesthesia Type: MAC  Level of Consciousness: awake, alert  and oriented  Airway & Oxygen Therapy: Patient Spontanous Breathing and Patient connected to face mask oxygen  Post-op Assessment: Report given to PACU RN and Post -op Vital signs reviewed and stable  Post vital signs: Reviewed and stable  Complications: No apparent anesthesia complications

## 2012-02-01 NOTE — H&P (View-Only) (Signed)
Pt instructed npo p mn 12/29 x meds w sip of water.  To [facility] 12/30 @ -0735.  Needs istat, ekg on arrival

## 2012-02-02 ENCOUNTER — Encounter (HOSPITAL_BASED_OUTPATIENT_CLINIC_OR_DEPARTMENT_OTHER): Payer: Self-pay | Admitting: Orthopedic Surgery

## 2012-07-26 ENCOUNTER — Telehealth: Payer: Self-pay

## 2012-07-26 DIAGNOSIS — F411 Generalized anxiety disorder: Secondary | ICD-10-CM

## 2012-07-26 DIAGNOSIS — R251 Tremor, unspecified: Secondary | ICD-10-CM

## 2012-07-26 MED ORDER — CLONAZEPAM 0.5 MG PO TABS: 0.5000 mg | ORAL_TABLET | Freq: Two times a day (BID) | ORAL | Status: DC | PRN

## 2012-07-27 MED ORDER — CLONAZEPAM 0.5 MG PO TABS: 0.5000 mg | ORAL_TABLET | Freq: Two times a day (BID) | ORAL | Status: DC | PRN

## 2012-07-27 NOTE — Telephone Encounter (Signed)
Per Dr. Alberteen Sam it is ok to refill Klonopin for 30 days. LB

## 2012-08-04 ENCOUNTER — Encounter: Payer: Self-pay | Admitting: Family Medicine

## 2012-08-04 ENCOUNTER — Ambulatory Visit (INDEPENDENT_AMBULATORY_CARE_PROVIDER_SITE_OTHER): Payer: Medicare Other | Admitting: Family Medicine

## 2012-08-04 VITALS — BP 109/73 | HR 72 | Wt 205.0 lb

## 2012-08-04 DIAGNOSIS — M949 Disorder of cartilage, unspecified: Secondary | ICD-10-CM

## 2012-08-04 DIAGNOSIS — M8580 Other specified disorders of bone density and structure, unspecified site: Secondary | ICD-10-CM

## 2012-08-04 DIAGNOSIS — R251 Tremor, unspecified: Secondary | ICD-10-CM

## 2012-08-04 DIAGNOSIS — M899 Disorder of bone, unspecified: Secondary | ICD-10-CM

## 2012-08-04 DIAGNOSIS — R252 Cramp and spasm: Secondary | ICD-10-CM

## 2012-08-04 DIAGNOSIS — R5383 Other fatigue: Secondary | ICD-10-CM

## 2012-08-04 DIAGNOSIS — R259 Unspecified abnormal involuntary movements: Secondary | ICD-10-CM

## 2012-08-04 DIAGNOSIS — R5381 Other malaise: Secondary | ICD-10-CM

## 2012-08-04 MED ORDER — BUPROPION HCL ER (XL) 300 MG PO TB24: 300.0000 mg | ORAL_TABLET | Freq: Every day | ORAL | Status: DC

## 2012-08-04 MED ORDER — CLONAZEPAM 0.5 MG PO TABS: 0.5000 mg | ORAL_TABLET | Freq: Two times a day (BID) | ORAL | Status: DC | PRN

## 2012-08-04 MED ORDER — POTASSIUM CHLORIDE CRYS ER 10 MEQ PO TBCR: 10.0000 meq | EXTENDED_RELEASE_TABLET | Freq: Two times a day (BID) | ORAL | Status: DC

## 2012-08-04 NOTE — Patient Instructions (Addendum)
Leg Cramps Leg cramps that occur during exercise can be caused by poor circulation or dehydration. However, muscle cramps that occur at rest or during the night are usually not due to any serious medical problem. Heat cramps may cause muscle spasms during hot weather.  CAUSES There is no clear cause for muscle cramps. However, dehydration may be a factor for those who do not drink enough fluids and those who exercise in the heat. Imbalances in the level of sodium, potassium, calcium or magnesium in the muscle tissue may also be a factor. Some medications, such as water pills (diuretics), may cause loss of chemicals that the body needs (like sodium and potassium) and cause muscle cramps. TREATMENT   Make sure your diet has enough fluids and essential minerals for the muscle to work normally.  Avoid strenuous exercise for several days if you have been having frequent leg cramps.  Stretch and massage the cramped muscle for several minutes.  Some medicines may be helpful in some patients with night cramps. Only take over-the-counter or prescription medicines as directed by your caregiver. SEEK IMMEDIATE MEDICAL CARE IF:   Your leg cramps become worse.  Your foot becomes cold, numb, or blue. Document Released: 02/27/2004 Document Revised: 04/13/2011 Document Reviewed: 02/14/2008 [facility] Patient Information 2014 [place], [place]. Triglycerides, TG, TRIG  This is a test to check your risk of developing heart disease. It is often done as part of a lipid profile during a regular medical exam or if you are being treated for high triglycerides. This test measures the amount of triglycerides in your blood. Triglycerides are the body's storage form for fat. Most triglycerides are found in fat tissue. Some triglycerides circulate in the blood to provide fuel for muscles to work. Extra triglycerides are found in the blood after eating a meal when fat is being sent from the gut to fat tissue for storage. The  test for triglycerides should be done when you are fasting and no extra triglycerides from a recent meal are present.  SAMPLE COLLECTION The test for triglycerides uses a blood sample. Most often, the blood sample is collected using a needle to collect blood from a vein. Sometimes triglycerides are measured using a drop of blood collected by puncturing the skin on a finger. Testing should be done when you are fasting. For 12 to 14 hours before the test, only water is permitted. In addition, alcohol should not be consumed for the 24 hours just before the test. If you are diabetic and your blood sugar is out of control, triglycerides will be very high. NORMAL FINDINGS  Adult/elderly  Female: 40-160 mg/dL or 8.29-5.62 mmol/L (SI units)  Female: 35-135 mg/dL or 1.30-8.65 mmol/L (SI units)  0-69 years  Female: 30-86 mg/dL  Female: 78-46 mg/dL  9-69 years  Female: 95-284 mg/dL  Female: 13-244 mg/dL  01-69 years  Female: 72-536 mg/dL  Female: 64-403 mg/dL  47-69 years  Female: 59-563 mg/dL  Female: 87-564 mg/dL Ranges for normal findings may vary among different laboratories and hospitals. You should always check with your doctor after having lab work or other tests done to discuss the meaning of your test results and whether your values are considered within normal limits. MEANING OF TEST  Your caregiver will go over the test results with you and discuss the importance and meaning of your results, as well as treatment options and the need for additional tests if necessary. OBTAINING THE TEST RESULTS It is your responsibility to obtain your test results. Ask  the lab or department performing the test when and how you will get your results. Document Released: 02/22/2004 Document Revised: 04/13/2011 Document Reviewed: 01/01/2008 [employer] Patient Information 2014 [place], [place].  1)  Watch your diet over the summer and keep exercising and we'll check fasting labs in September.

## 2012-08-04 NOTE — Progress Notes (Signed)
Subjective:    Patient ID: Alyssa Macdonald, female    DOB: 09-Mar-1945, 67 y.o.   MRN: [id number]  HPI  Alyssa Macdonald is here to discuss the conditions listed below and to get several of her medications refilled.   1)  Essential Tremors:  She is doing well on the combination of Klonopin and Propanolol. She would like to be referred to a neurologist that a friend of hers recommended.    2)  Hyperlipidemia:  She is doing well on the combination of Lipitor and Lovaza. She thinks that she needs refills.    3)  Knee Pain:  Her knee pain is well controlled with her Tramadol/ASA.  4)  Bone Health:  Her orthopedic suggested that she start on Prolia injections.  She never received a phone call from the infusion center.   5)  Rash:  She developed a rash in her arms and legs after doing some yard work about two weeks ago.  She has been using Cortizone and Caladryl creams which have not helped her.   Review of Systems  Constitutional: Positive for fatigue. Negative for activity change and unexpected weight change.  HENT: Negative.   Respiratory: Negative for shortness of breath.   Cardiovascular: Negative for chest pain, palpitations and leg swelling.  Gastrointestinal: Negative.   Genitourinary: Negative.   Musculoskeletal: Positive for arthralgias.  Skin: Positive for rash.  Neurological: Positive for tremors.  Psychiatric/Behavioral: Negative.     Past Medical History  Diagnosis Date  . Hypertension   . Hyperlipidemia   . GERD (gastroesophageal reflux disease)   . Trigger finger   . Complication of anesthesia     slow to awaken  . Cervical dystonia   . Hypopotassemia   . 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 Osteoarthrosis involving, or with mention of more than one site, but not specified as generalized, site unspecified   . Osteoarthrosis involving, or with mention of more than one site, but not specified as generalized, site unspecified   . Essential and other specified  forms of tremor   . Esophageal reflux     Past Surgical History  Procedure Laterality Date  . Joint replacement      bilateral  . Cholecystectomy    . Abdominal hysterectomy    . Appendectomy    . Colon surgery      bowel resection  . Tonsillectomy    . Foot surgery    . Trigger finger release  02/01/2012    Procedure: RELEASE TRIGGER FINGER/A-1 PULLEY;  Surgeon: Jodi Marble, MD;  Location: [facility];  Service: Orthopedics;  Laterality: Right;  RIGHT LONG FINGER  TRIGGER FINGER RELEASE    Family History  Problem Relation Age of Onset  . CVA Mother   . Diabetes Father   . Parkinson's disease Paternal Uncle    History   Social History Narrative   Marital Status: Married Air traffic controller)    Children:  Son Vilinda Blanks) Daughter Tobi Bastos)    Pets: Dog Berline Lopes)   Living Situation: Lives with spouse   Occupation: Retired     Education: Scientist, research (physical sciences) in Business    Tobacco Use/Exposure:  None    Alcohol Use:  Occasional 1-2 per week   Drug Use:  None   Diet:  Regular   Exercise:  None   Hobbies: Reading, Quilting                      Objective:  Physical Exam  Constitutional: She appears well-nourished. No distress.  HENT:  Head: Normocephalic.  Eyes: No scleral icterus.  Neck: No thyromegaly present.  Cardiovascular: Normal rate, regular rhythm and normal heart sounds.   Pulmonary/Chest: Effort normal and breath sounds normal.  Abdominal: There is no tenderness.  Musculoskeletal: She exhibits no edema and no tenderness.  Neurological: She is alert.  Skin: Skin is warm and dry.  Psychiatric: She has a normal mood and affect. Her behavior is normal. Judgment and thought content normal.          Assessment & Plan:

## 2012-09-10 ENCOUNTER — Encounter: Payer: Self-pay | Admitting: Family Medicine

## 2012-09-10 DIAGNOSIS — R252 Cramp and spasm: Secondary | ICD-10-CM | POA: Insufficient documentation

## 2012-09-10 DIAGNOSIS — M8580 Other specified disorders of bone density and structure, unspecified site: Secondary | ICD-10-CM | POA: Insufficient documentation

## 2012-09-10 DIAGNOSIS — R5383 Other fatigue: Secondary | ICD-10-CM | POA: Insufficient documentation

## 2012-09-10 DIAGNOSIS — R251 Tremor, unspecified: Secondary | ICD-10-CM | POA: Insufficient documentation

## 2012-09-10 DIAGNOSIS — R5381 Other malaise: Secondary | ICD-10-CM | POA: Insufficient documentation

## 2012-09-10 NOTE — Assessment & Plan Note (Signed)
Refilled her Klonopin and will refer her to the neurologist she requested.

## 2012-09-10 NOTE — Assessment & Plan Note (Signed)
Refilled her Wellbutrin XL.

## 2012-09-10 NOTE — Assessment & Plan Note (Signed)
She was given a prescription for K-Dur.

## 2012-09-10 NOTE — Assessment & Plan Note (Signed)
We tried to send Alyssa Macdonald for Reclast but really she no longer needs a bisphosphonate since she has taken them for over 5 years (Fosamax, Actonel and Reclast).  Her orthopedist has recommended that she do Prolia.  I am not sure if her insurance will cover it unless she has a diagnosis of osteoporosis.  Her last bone density was done in 2011 and it showed osteopenia.

## 2012-09-15 LAB — HM MAMMOGRAPHY: HM Mammogram: NORMAL

## 2012-09-30 ENCOUNTER — Other Ambulatory Visit: Payer: Self-pay | Admitting: *Deleted

## 2012-09-30 DIAGNOSIS — E785 Hyperlipidemia, unspecified: Secondary | ICD-10-CM

## 2012-09-30 DIAGNOSIS — E559 Vitamin D deficiency, unspecified: Secondary | ICD-10-CM

## 2012-09-30 DIAGNOSIS — R5381 Other malaise: Secondary | ICD-10-CM

## 2012-10-04 ENCOUNTER — Ambulatory Visit: Payer: Medicare Other

## 2012-10-04 LAB — COMPLETE METABOLIC PANEL WITH GFR
ALT: 12 U/L (ref 0–35)
AST: 14 U/L (ref 0–37)
Albumin: 4.6 g/dL (ref 3.5–5.2)
Alkaline Phosphatase: 62 U/L (ref 39–117)
BUN: 19 mg/dL (ref 6–23)
CO2: 27 mEq/L (ref 19–32)
Calcium: 9.6 mg/dL (ref 8.4–10.5)
Chloride: 103 mEq/L (ref 96–112)
Creat: 0.98 mg/dL (ref 0.50–1.10)
GFR, Est African American: 68 mL/min
GFR, Est Non African American: 59 mL/min — ABNORMAL LOW
Glucose, Bld: 104 mg/dL — ABNORMAL HIGH (ref 70–99)
Potassium: 3.7 mEq/L (ref 3.5–5.3)
Sodium: 141 mEq/L (ref 135–145)
Total Bilirubin: 0.5 mg/dL (ref 0.3–1.2)
Total Protein: 6.1 g/dL (ref 6.0–8.3)

## 2012-10-04 LAB — CBC WITH DIFFERENTIAL/PLATELET
Basophils Absolute: 0.1 10*3/uL (ref 0.0–0.1)
Basophils Relative: 1 % (ref 0–1)
Eosinophils Absolute: 0.1 10*3/uL (ref 0.0–0.7)
Eosinophils Relative: 2 % (ref 0–5)
HCT: 41.6 % (ref 36.0–46.0)
Hemoglobin: 14.9 g/dL (ref 12.0–15.0)
Lymphocytes Relative: 29 % (ref 12–46)
Lymphs Abs: 1.5 10*3/uL (ref 0.7–4.0)
MCH: 30.9 pg (ref 26.0–34.0)
MCHC: 35.8 g/dL (ref 30.0–36.0)
MCV: 86.3 fL (ref 78.0–100.0)
Monocytes Absolute: 0.5 10*3/uL (ref 0.1–1.0)
Monocytes Relative: 9 % (ref 3–12)
Neutro Abs: 3.1 10*3/uL (ref 1.7–7.7)
Neutrophils Relative %: 59 % (ref 43–77)
Platelets: 330 10*3/uL (ref 150–400)
RBC: 4.82 MIL/uL (ref 3.87–5.11)
RDW: 14.7 % (ref 11.5–15.5)
WBC: 5.2 10*3/uL (ref 4.0–10.5)

## 2012-10-04 LAB — LIPID PANEL
Cholesterol: 177 mg/dL (ref 0–200)
HDL: 49 mg/dL (ref >39)
LDL Cholesterol: 99 mg/dL (ref 0–99)
Total CHOL/HDL Ratio: 3.6 Ratio
Triglycerides: 144 mg/dL (ref <150)
VLDL: 29 mg/dL (ref 0–40)

## 2012-10-04 LAB — TSH: TSH: 2.942 u[IU]/mL (ref 0.350–4.500)

## 2012-10-05 LAB — VITAMIN D 25 HYDROXY (VIT D DEFICIENCY, FRACTURES): Vit D, 25-Hydroxy: 52 ng/mL (ref 30–89)

## 2012-10-11 ENCOUNTER — Ambulatory Visit (INDEPENDENT_AMBULATORY_CARE_PROVIDER_SITE_OTHER): Payer: Medicare Other | Admitting: Family Medicine

## 2012-10-11 ENCOUNTER — Telehealth: Payer: Self-pay | Admitting: *Deleted

## 2012-10-11 VITALS — BP 138/80 | HR 80 | Resp 16 | Ht 65.5 in | Wt 203.0 lb

## 2012-10-11 DIAGNOSIS — M8580 Other specified disorders of bone density and structure, unspecified site: Secondary | ICD-10-CM

## 2012-10-11 DIAGNOSIS — R5381 Other malaise: Secondary | ICD-10-CM

## 2012-10-11 DIAGNOSIS — K219 Gastro-esophageal reflux disease without esophagitis: Secondary | ICD-10-CM

## 2012-10-11 DIAGNOSIS — E785 Hyperlipidemia, unspecified: Secondary | ICD-10-CM

## 2012-10-11 DIAGNOSIS — E669 Obesity, unspecified: Secondary | ICD-10-CM | POA: Insufficient documentation

## 2012-10-11 DIAGNOSIS — G8929 Other chronic pain: Secondary | ICD-10-CM

## 2012-10-11 DIAGNOSIS — M25569 Pain in unspecified knee: Secondary | ICD-10-CM

## 2012-10-11 DIAGNOSIS — M899 Disorder of bone, unspecified: Secondary | ICD-10-CM

## 2012-10-11 DIAGNOSIS — Z23 Encounter for immunization: Secondary | ICD-10-CM

## 2012-10-11 DIAGNOSIS — R252 Cramp and spasm: Secondary | ICD-10-CM

## 2012-10-11 MED ORDER — TRAMADOL-ACETAMINOPHEN 37.5-325 MG PO TABS: 1.0000 | ORAL_TABLET | Freq: Three times a day (TID) | ORAL | Status: DC | PRN

## 2012-10-11 MED ORDER — OMEPRAZOLE 20 MG PO CPDR: 20.0000 mg | DELAYED_RELEASE_CAPSULE | Freq: Every day | ORAL | Status: DC

## 2012-10-11 MED ORDER — ATORVASTATIN CALCIUM 10 MG PO TABS: 10.0000 mg | ORAL_TABLET | Freq: Every day | ORAL | Status: DC

## 2012-10-11 MED ORDER — OMEGA-3-ACID ETHYL ESTERS 1 G PO CAPS: 2.0000 g | ORAL_CAPSULE | Freq: Two times a day (BID) | ORAL | Status: DC

## 2012-10-11 MED ORDER — BUPROPION HCL ER (XL) 300 MG PO TB24: 300.0000 mg | ORAL_TABLET | Freq: Every day | ORAL | Status: DC

## 2012-10-11 MED ORDER — POTASSIUM CHLORIDE CRYS ER 10 MEQ PO TBCR: 10.0000 meq | EXTENDED_RELEASE_TABLET | Freq: Two times a day (BID) | ORAL | Status: DC

## 2012-10-11 MED ORDER — DENOSUMAB 60 MG/ML ~~LOC~~ SOLN: 60.0000 mg | SUBCUTANEOUS | Status: DC

## 2012-10-11 MED ORDER — DICLOFENAC SODIUM 1 % TD GEL: 4.0000 g | Freq: Four times a day (QID) | TRANSDERMAL | Status: DC

## 2012-10-11 NOTE — Patient Instructions (Addendum)
1)  Tremor - F/U with the neurologist and let her decide what medications you need to be on.     Fat and Cholesterol Control Diet Cholesterol levels in your body are determined significantly by your diet. Cholesterol levels may also be related to heart disease. The following material helps to explain this relationship and discusses what you can do to help keep your heart healthy. Not all cholesterol is bad. Low-density lipoprotein (LDL) cholesterol is the "bad" cholesterol. It may cause fatty deposits to build up inside your arteries. High-density lipoprotein (HDL) cholesterol is "good." It helps to remove the "bad" LDL cholesterol from your blood. Cholesterol is a very important risk factor for heart disease. Other risk factors are high blood pressure, smoking, stress, heredity, and weight. The heart muscle gets its supply of blood through the coronary arteries. If your LDL cholesterol is high and your HDL cholesterol is low, you are at risk for having fatty deposits build up in your coronary arteries. This leaves less room through which blood can flow. Without sufficient blood and oxygen, the heart muscle cannot function properly and you may feel chest pains (angina pectoris). When a coronary artery closes up entirely, a part of the heart muscle may die causing a heart attack (myocardial infarction). CHECKING CHOLESTEROL When your caregiver sends your blood to a lab to be examined for cholesterol, a complete lipid (fat) profile may be done. With this test, the total amount of cholesterol and levels of LDL and HDL are determined. Triglycerides are a type of fat that circulates in the blood. They can also be used to determine heart disease risk. The list below describes what the numbers should be: Test: Total Cholesterol.  Less than 200 mg/dl. Test: LDL "bad cholesterol."  Less than 100 mg/dl.  Less than 70 mg/dl if you are at very high risk of a heart attack or sudden cardiac death. Test: HDL "good  cholesterol."  Greater than 50 mg/dl for women.  Greater than 40 mg/dl for men. Test: Triglycerides.  Less than 150 mg/dl. CONTROLLING CHOLESTEROL WITH DIET Although exercise and lifestyle factors are important, your diet is key. That is because certain foods are known to raise cholesterol and others to lower it. The goal is to balance foods for their effect on cholesterol and more importantly, to replace saturated and trans fat with other types of fat, such as monounsaturated fat, polyunsaturated fat, and omega-3 fatty acids. On average, a person should consume no more than 15 to 17 g of saturated fat daily. Saturated and trans fats are considered "bad" fats, and they will raise LDL cholesterol. Saturated fats are primarily found in animal products such as meats, butter, and cream. However, that does not mean you need to give up all your favorite foods. Today, there are good tasting, low-fat, low-cholesterol substitutes for most of the things you like to eat. Choose low-fat or nonfat alternatives. Choose round or loin cuts of red meat. These types of cuts are lowest in fat and cholesterol. Chicken (without the skin), fish, veal, and ground [place] breast are great choices. Eliminate fatty meats, such as hot dogs and salami. Even shellfish have little or no saturated fat. Have a 3 oz (85 g) portion when you eat lean meat, poultry, or fish. Trans fats are also called "partially hydrogenated oils." They are oils that have been scientifically manipulated so that they are solid at room temperature resulting in a longer shelf life and improved taste and texture of foods in which they  are added. Trans fats are found in stick margarine, some tub margarines, cookies, crackers, and baked goods.  When baking and cooking, oils are a great substitute for butter. The monounsaturated oils are especially beneficial since it is believed they lower LDL and raise HDL. The oils you should avoid entirely are saturated  tropical oils, such as coconut and palm.  Remember to eat a lot from food groups that are naturally free of saturated and trans fat, including fish, fruit, vegetables, beans, grains (barley, rice, couscous, bulgur wheat), and pasta (without cream sauces).  IDENTIFYING FOODS THAT LOWER CHOLESTEROL  Soluble fiber may lower your cholesterol. This type of fiber is found in fruits such as apples, vegetables such as broccoli, potatoes, and carrots, legumes such as beans, peas, and lentils, and grains such as barley. Foods fortified with plant sterols (phytosterol) may also lower cholesterol. You should eat at least 2 g per day of these foods for a cholesterol lowering effect.  Read package labels to identify low-saturated fats, trans fat free, and low-fat foods at the supermarket. Select cheeses that have only 2 to 3 g saturated fat per ounce. Use a heart-healthy tub margarine that is free of trans fats or partially hydrogenated oil. When buying baked goods (cookies, crackers), avoid partially hydrogenated oils. Breads and muffins should be made from whole grains (whole-wheat or whole oat flour, instead of "flour" or "enriched flour"). Buy non-creamy canned soups with reduced salt and no added fats.  FOOD PREPARATION TECHNIQUES  Never deep-fry. If you must fry, either stir-fry, which uses very little fat, or use non-stick cooking sprays. When possible, broil, bake, or roast meats, and steam vegetables. Instead of putting butter or margarine on vegetables, use lemon and herbs, applesauce, and cinnamon (for squash and sweet potatoes), nonfat yogurt, salsa, and low-fat dressings for salads.  LOW-SATURATED FAT / LOW-FAT FOOD SUBSTITUTES Meats / Saturated Fat (g)  Avoid: Steak, marbled (3 oz/85 g) / 11 g  Choose: Steak, lean (3 oz/85 g) / 4 g  Avoid: Hamburger (3 oz/85 g) / 7 g  Choose: Hamburger, lean (3 oz/85 g) / 5 g  Avoid: Ham (3 oz/85 g) / 6 g  Choose: Ham, lean cut (3 oz/85 g) / 2.4 g  Avoid:  Chicken, with skin, dark meat (3 oz/85 g) / 4 g  Choose: Chicken, skin removed, dark meat (3 oz/85 g) / 2 g  Avoid: Chicken, with skin, light meat (3 oz/85 g) / 2.5 g  Choose: Chicken, skin removed, light meat (3 oz/85 g) / 1 g Dairy / Saturated Fat (g)  Avoid: Whole milk (1 cup) / 5 g  Choose: Low-fat milk, 2% (1 cup) / 3 g  Choose: Low-fat milk, 1% (1 cup) / 1.5 g  Choose: Skim milk (1 cup) / 0.3 g  Avoid: Hard cheese (1 oz/28 g) / 6 g  Choose: Skim milk cheese (1 oz/28 g) / 2 to 3 g  Avoid: Cottage cheese, 4% fat (1 cup) / 6.5 g  Choose: Low-fat cottage cheese, 1% fat (1 cup) / 1.5 g  Avoid: Ice cream (1 cup) / 9 g  Choose: Sherbet (1 cup) / 2.5 g  Choose: Nonfat frozen yogurt (1 cup) / 0.3 g  Choose: Frozen fruit bar / trace  Avoid: Whipped cream (1 tbs) / 3.5 g  Choose: Nondairy whipped topping (1 tbs) / 1 g Condiments / Saturated Fat (g)  Avoid: Mayonnaise (1 tbs) / 2 g  Choose: Low-fat mayonnaise (1 tbs) / 1 g  Avoid: Butter (1 tbs) / 7 g  Choose: Extra light margarine (1 tbs) / 1 g  Avoid: Coconut oil (1 tbs) / 11.8 g  Choose: Olive oil (1 tbs) / 1.8 g  Choose: Corn oil (1 tbs) / 1.7 g  Choose: Safflower oil (1 tbs) / 1.2 g  Choose: Sunflower oil (1 tbs) / 1.4 g  Choose: Soybean oil (1 tbs) / 2.4 g  Choose: Canola oil (1 tbs) / 1 g Document Released: 01/19/2005 Document Revised: 04/13/2011 Document Reviewed: 07/10/2010 [employer] Patient Information 2014 [place], [place].

## 2012-10-11 NOTE — Telephone Encounter (Signed)
Patient's insurance does not require a prior authorization for her Prolia.  She is responsible of a copay of $50 to receive her injection.  PG

## 2012-10-11 NOTE — Progress Notes (Signed)
  Subjective:    Patient ID: Alyssa Macdonald, female    DOB: August 16, 1945, 67 y.o.   MRN: [id number]  HPI  [name] is here today to go over her most recent lab results, discuss the conditions listed below and to get her medications refilled.  1)  Osteopenia:  She has been on Actonel for many years.  She wanted to try the yearly IV infusion but never heard from the infusion center.      2)  Hypertension: She takes the propranolol and HCTZ without any problem.  3)  Hyperlipidemia:  She is doing fine on the combination of Lipitor and Lovaza.    4)  Essential Tremors:  She continues to do well with her propanolol and klonopin. She has an upcoming appointment with a neurologist.    5)  Chronic Pain:  She needs a refill on her Ibuprofen and Ultracet.    Review of Systems  Constitutional: Negative.   HENT: Negative.   Eyes: Negative.   Respiratory: Negative.   Cardiovascular: Negative.   Gastrointestinal: Negative.   Endocrine: Negative.   Genitourinary: Negative.   Musculoskeletal: Negative.   Skin: Negative.   Allergic/Immunologic: Negative.   Neurological: Negative.   Hematological: Negative.   Psychiatric/Behavioral: Negative.      Past Medical History  Diagnosis Date  . Hypertension   . Hyperlipidemia   . GERD (gastroesophageal reflux disease)   . Trigger finger   . Complication of anesthesia     slow to awaken  . Cervical dystonia   . Hypopotassemia   . 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 Osteoarthrosis involving, or with mention of more than one site, but not specified as generalized, site unspecified   . Osteoarthrosis involving, or with mention of more than one site, but not specified as generalized, site unspecified   . Essential and other specified forms of tremor   . Esophageal reflux      Family History  Problem Relation Age of Onset  . CVA Mother   . Diabetes Father   . Parkinson's disease Paternal Uncle      History   Social History  Narrative   Marital Status: Married Air traffic controller)    Children:  Son Vilinda Blanks) Daughter Tobi Bastos)    Pets: Dog Berline Lopes)   Living Situation: Lives with spouse   Occupation: Retired     Education: Scientist, research (physical sciences) in Business    Tobacco Use/Exposure:  None    Alcohol Use:  Occasional 1-2 per week   Drug Use:  None   Diet:  Regular   Exercise:  None   Hobbies: Reading, Quilting                     Objective:   Physical Exam  Vitals reviewed. Constitutional: She is oriented to person, place, and time. She appears well-developed and well-nourished.  Eyes: Conjunctivae are normal. No scleral icterus.  Neck: Neck supple. No thyromegaly present.  Cardiovascular: Normal rate, regular rhythm and normal heart sounds.   Pulmonary/Chest: Effort normal and breath sounds normal.  Musculoskeletal: She exhibits no edema and no tenderness.  Lymphadenopathy:    She has no cervical adenopathy.  Neurological: She is alert and oriented to person, place, and time.  Head Tremor  Skin: Skin is warm and dry.  Psychiatric: She has a normal mood and affect. Her behavior is normal. Judgment and thought content normal.          Assessment & Plan:

## 2012-10-12 ENCOUNTER — Ambulatory Visit (INDEPENDENT_AMBULATORY_CARE_PROVIDER_SITE_OTHER): Payer: Medicare Other | Admitting: Neurology

## 2012-10-12 ENCOUNTER — Encounter: Payer: Self-pay | Admitting: Neurology

## 2012-10-12 VITALS — BP 176/99 | HR 90 | Ht 66.0 in | Wt 206.5 lb

## 2012-10-12 DIAGNOSIS — R259 Unspecified abnormal involuntary movements: Secondary | ICD-10-CM

## 2012-10-12 DIAGNOSIS — G243 Spasmodic torticollis: Secondary | ICD-10-CM

## 2012-10-12 DIAGNOSIS — G252 Other specified forms of tremor: Secondary | ICD-10-CM

## 2012-10-12 DIAGNOSIS — R251 Tremor, unspecified: Secondary | ICD-10-CM

## 2012-10-12 DIAGNOSIS — G250 Essential tremor: Secondary | ICD-10-CM

## 2012-10-12 DIAGNOSIS — G248 Other dystonia: Secondary | ICD-10-CM

## 2012-10-12 DIAGNOSIS — G2589 Other specified extrapyramidal and movement disorders: Secondary | ICD-10-CM

## 2012-10-12 DIAGNOSIS — R490 Dysphonia: Secondary | ICD-10-CM

## 2012-10-12 MED ORDER — PROPRANOLOL HCL ER 60 MG PO CP24: 60.0000 mg | ORAL_CAPSULE | ORAL | Status: DC

## 2012-10-12 NOTE — Patient Instructions (Signed)
Dystonias The dystonias are movement disorders in which sustained muscle contractions cause twisting and repetitive movements or abnormal postures. The movements, which are involuntary and sometimes painful, may affect a single muscle; a group of muscles such as those in the arms, legs, or neck; or the entire body. Early symptoms (problems) may include a deterioration in handwriting after writing several lines, foot cramps, and a tendency of one foot to pull up or drag after running or walking some distance. Other possible symptoms are tremor and voice or speech difficulties. Birth injury (particularly due to lack of oxygen), certain infections, reactions to certain drugs, heavy-metal or carbon monoxide poisoning, trauma (damage caused by an accident), or stroke can cause dystonic symptoms. About half the cases of dystonia have no connection to disease or injury and are called primary or idiopathic dystonia. Of the primary dystonias, many cases appear to be inherited in a dominant manner. Dystonias can also be symptoms of other diseases, some of which may be hereditary (passed down from parents). In some individuals, symptoms of a dystonia appear spontaneously in childhood between the ages of 5 and 23, usually in the foot or in the hand. For other individuals, the symptoms emerge in late adolescence or early adulthood. TREATMENT  No one treatment has been found universally effective for dystonia. Instead, physicians use a variety of therapies (medications, surgery and other treatments such as physical therapy, splinting, stress management, and biofeedback), aimed at reducing or eliminating muscle spasms and pain. Since response to drugs varies among patients and even in the same person over time, the therapy must be individualized. PROGNOSIS The initial symptoms can be very mild and may be noticeable only after prolonged exertion, stress, or fatigue. Over a period of time, the symptoms may become more  noticeable and widespread and be unrelenting; sometimes, however, there is little or no progression. RESEARCH BEING DONE Investigators believe that the dystonias result from an abnormality in an area of the brain called the basal ganglia, where some of the messages that initiate muscle contractions are processed. Scientists suspect a defect in the body's ability to process a group of chemicals called neurotransmitters that help cells in the brain communicate with each other. Scientists at the [facility] laboratories have conducted detailed investigations of the pattern of muscle activity in persons with dystonias. Studies using EEG analysis and neuroimaging are probing brain activity. The search for the gene or genes responsible for some forms of dominantly inherited dystonias continues. 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 In 1997, the team sequenced the DYT1 gene and found that it codes for a previously unknown protein now called "torsin A." Document Released: 01/09/2002 Document Revised: 04/13/2011 Document Reviewed: 01/19/2005 [facility] Patient Information 2014 [place], [place]. Tremor Tremor is a rhythmic, involuntary muscular contraction characterized by oscillations (to-and-fro movements) of a part of the body. The most common of all involuntary movements, tremor can affect various body parts such as the hands, head, facial structures, vocal cords, trunk, and legs; most tremors, however, occur in the hands. Tremor often accompanies neurological disorders associated with aging. Although the disorder is not life-threatening, it can be responsible for functional disability and social embarrassment. TREATMENT  There are many types of tremor and several ways in which tremor is classified. The most common classification is by behavioral context or position. There are five categories of tremor within this classification:  resting, postural, kinetic, task-specific, and psychogenic. Resting or  static tremor occurs when the muscle is at rest, for example when the hands are lying on the lap. This type of tremor is often seen in patients with Parkinson's disease. Postural tremor occurs when a patient attempts to maintain posture, such as holding the hands outstretched. Postural tremors include physiological tremor, essential tremor, tremor with basal ganglia disease (also seen in patients with Parkinson's disease), cerebellar postural tremor, tremor with peripheral neuropathy, post-traumatic tremor, and alcoholic tremor. Kinetic or intention (action) tremor occurs during purposeful movement, for example during finger-to-nose testing. Task-specific tremor appears when performing goal-oriented tasks such as handwriting, speaking, or standing. This group consists of primary writing tremor, vocal tremor, and orthostatic tremor. Psychogenic tremor occurs in both older and younger patients. The key feature of this tremor is that it dramatically lessens or disappears when the patient is distracted. PROGNOSIS There are some treatment options available for tremor; the appropriate treatment depends on accurate diagnosis of the cause. Some tremors respond to treatment of the underlying condition, for example in some cases of psychogenic tremor treating the patient's underlying mental problem may cause the tremor to disappear. Also, patients with tremor due to Parkinson's disease may be treated with Levodopa drug therapy. Symptomatic drug therapy is available for several other tremors as well. For those cases of tremor in which there is no effective drug treatment, physical measures such as teaching the patient to brace the affected limb during the tremor are sometimes useful. Surgical intervention such as thalamotomy or deep brain stimulation may be useful in certain cases. Document Released: 01/09/2002 Document Revised: 04/13/2011 Document  Reviewed: 01/19/2005 [facility] Patient Information 2014 [place], [place].

## 2012-10-12 NOTE — Progress Notes (Signed)
[facility]  Provider:  Melvyn Macdonald, M D  Referring Provider: Gillian Scarce, MD Primary Care Physician:  Alyssa Jubilee, MD  Chief Complaint  Patient presents with  . New Evaluation    Alyssa Macdonald,tremor,rm 11    HPI:  Alyssa Macdonald is a 67 y.o. female  Is seen here as a referral/ revisit  from Dr. Alberteen Macdonald for tremor.  Alyssa Macdonald, a Caucasian, 67 year old right-handed female presents today for evaluation of tremor. The patient has associated this her laudable tremorous, intubation vocal cords trembling. She also reports that she has difficulties this giving vocal cord months on a fold for example, she cannot use a touch screen easily because of her tremor and essentially back to her tremor started in elementary school he has been there all her life. The patient states that nobody in her family her parents nor siblings have tremors, but  she remembers one paternal  great- aunt that may have had tremors in her 58s with titubation. Alyssa Macdonald reports that the tremor has progressed as has the dysphonia, titubation.  The patient indicates that her tremors may increase in amplitude when she fatigues. When she is self-conscious of her tremors they also get worse. Anxiety, no pauses he will increase his tremor is. She is not quite sure if uncle reduces her tremor she drinks about one glass of wine " once  a week at the most" and it seems to possibly decrease the tremor.  She has not been treated with medication when she was a child and there were no medications that could have been the cause for the tremor.  Her tremors were first treated with propanolol by Dr Alyssa Macdonald at [facility], starting in 2010- until than no therapy. 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  He did not indicate that the dysphonia was spasmodic or tremor related.   She does not have a history of a traumatic brain  injury, he has no unexplained loss of consciousness in her past medical history, but she recalls being called to that she was a difficult to deliver a be, in that her mother had possible placenta previa and almost died. 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  She was a caesarian delivery after forceps were used without success.  She was never told that she was premature or had low Apgars.   She has no fainting spells, no axial tremors, no masked face and no urinary incontinence. No falls, no fainting.    Review of Systems: Out of a complete 14 system review, the patient complains of only the following symptoms, and all other reviewed systems are negative. : is positive for shaking and stiffness in AM. The patient also endorsed ringing in the ears, non-pulsatile tinnitus. In both ears. She has a cervical rib and wonders if an impingement may have let to increased tremors over the last 6 weeks , when she had exercised vigorously.     History   Social History  . Marital Status: Married    Spouse Name: Alyssa Macdonald    Number of Children: 2  . Years of Education: 14   Occupational History  . RETIRED     Social History Main Topics  .  Smoking status: Never Smoker   . Smokeless tobacco: Never Used  . Alcohol Use: Yes     Comment: Occasional   . Drug Use: No  . Sexual Activity: Yes    Partners: Male   Other Topics Concern  . Not on file   Social History Narrative   Marital Status: Married Air traffic controller)    Children:  Son Alyssa Macdonald) Daughter Alyssa Macdonald)    Pets: Dog Alyssa Macdonald)   Living Situation: Lives with spouse   Occupation: Retired     Education: Scientist, research (physical sciences) in Business    Tobacco Use/Exposure:  None    Alcohol Use:  Occasional 1-2 per week   Drug Use:  None   Diet:  Regular   Exercise:  None   Hobbies: Reading, Quilting                    Family History  Problem Relation Age of Onset  . CVA Mother   . Diabetes Father   . Parkinson's disease Paternal Uncle   . Cataracts Sister     Past Medical History  Diagnosis Date  . Hypertension   . Hyperlipidemia   . GERD (gastroesophageal reflux disease)   . Trigger finger   . Complication of anesthesia     slow to awaken  . Cervical dystonia   . Hypopotassemia   . 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 Osteoarthrosis involving, or with mention of more than one site, but not specified as generalized, site unspecified   . Osteoarthrosis involving, or with mention of more than one site, but not specified as generalized, site unspecified   . Essential and other specified forms of tremor   . Esophageal reflux   . Endometriosis     Past Surgical History  Procedure Laterality Date  . Joint replacement      bilateral  . Cholecystectomy    . Abdominal hysterectomy    . Appendectomy    . Colon surgery      bowel resection  . Tonsillectomy    . Foot surgery    . Trigger finger release  02/01/2012    Procedure: RELEASE TRIGGER FINGER/A-1 PULLEY;  Surgeon: Alyssa Marble, MD;  Location: [facility];  Service: Orthopedics;  Laterality: Right;  RIGHT LONG FINGER  TRIGGER FINGER RELEASE    Current Outpatient Prescriptions  Medication Sig Dispense Refill  . atorvastatin (LIPITOR) 10 MG tablet Take 1 tablet (10 mg total) by mouth daily.  30 tablet  11  . buPROPion (WELLBUTRIN XL) 300 MG 24 hr tablet Take 1 tablet (300 mg total) by mouth daily.  30 tablet  5  . clonazePAM (KLONOPIN) 0.5 MG tablet Take 1 tablet (0.5 mg total) by mouth 2 (two) times daily as needed.  60 tablet  5  . denosumab (PROLIA) 60 MG/ML SOLN injection Inject 60 mg into the skin every 6 (six) months. Administer in upper arm, thigh, or abdomen  1 mL  1  . diclofenac sodium (VOLTAREN) 1 % GEL Apply 4 g topically 4 (four) times daily.  5 Tube  2  . hydrochlorothiazide (HYDRODIURIL)  25 MG tablet Take 25 mg by mouth daily.      Marland Kitchen omega-3 acid ethyl esters (LOVAZA) 1 G capsule Take 2 capsules (2 g total) by mouth 2 (two) times daily.  120 capsule  5  . omeprazole (PRILOSEC) 20 MG capsule Take 1 capsule (20 mg total) by mouth daily.  90 capsule  1  .  potassium chloride (K-DUR,KLOR-CON) 10 MEQ tablet Take 1 tablet (10 mEq total) by mouth 2 (two) times daily.  60 tablet  11  . propranolol (INDERAL) 10 MG tablet Take 10 mg by mouth daily.      . risedronate (ACTONEL) 150 MG tablet Take 150 mg by mouth every 30 (thirty) days. with water on empty stomach, nothing by mouth or lie down for next 30 minutes.      Marland Kitchen aspirin 81 MG tablet Take 81 mg by mouth daily.      Marland Kitchen ibuprofen (ADVIL,MOTRIN) 800 MG tablet Take 800 mg by mouth every 8 (eight) hours as needed.      . multivitamin-iron-minerals-folic acid (CENTRUM) chewable tablet Chew 1 tablet by mouth daily.      . traMADol-acetaminophen (ULTRACET) 37.5-325 MG per tablet Take 1 tablet by mouth every 8 (eight) hours as needed for pain.  90 tablet  1   No current facility-administered medications for this visit.    Allergies as of 10/12/2012 - Review Complete 10/12/2012  Allergen Reaction Noted  . Zocor [simvastatin]  08/01/2012    Vitals: BP 176/99  Pulse 90  Ht 5\' 6"  (1.676 m)  Wt 206 lb 8 oz (93.668 kg)  BMI 33.35 kg/m2 Last Weight:  Wt Readings from Last 1 Encounters:  10/12/12 206 lb 8 oz (93.668 kg)   Last Height:   Ht Readings from Last 1 Encounters:  10/12/12 5\' 6"  (1.676 m)    Physical exam:  General: The patient is awake, alert and appears not in acute distress. The patient is well groomed. Head: Normocephalic, atraumatic. Neck is supple. Mallampati 3 , neck circumference:14 ,75,  nasal deviation, but congestion.  Cardiovascular:  Regular rate and rhythm , without  murmurs or carotid bruit, and without distended neck veins. Respiratory: Lungs are clear to auscultation. Skin:  Without evidence of edema, or  rash Trunk: BMI is  elevated and patient  has normal posture. She is shaking when standing , not severely .   Neurologic exam : The patient is awake and alert, oriented to place and time.  Memory subjective  described as intact. There is a normal attention span & concentration ability.  Speech is fluent without dysarthria, but with  dysphonia . Mood and affect are appropriate.  Cranial nerves: Pupils are equal and briskly reactive to light. Funduscopic exam without  evidence of pallor or edema. Extraocular movements  in vertical and horizontal planes intact and without nystagmus.  Visual fields by finger perimetry are intact. Hearing to finger rub intact.  Facial sensation intact to fine touch. Facial motor strength is symmetric and tongue and uvula move midline.  Motor exam:  Normal tone and normal muscle bulk and symmetric normal strength in all extremities. She has some tenderness over the neck muscles , paraspinal -  no rotational abnormality in neck movements, no restriction of bending or retroflexion, shoulder shrug,   The tremor is unassociated with rigor or cog wheeling.   Sensory:  Fine touch, pinprick and vibration were tested in all extremities. Proprioception is normal.  Coordination: Rapid alternating movements in the fingers/hands is tested and show  Tremor, but not dysmetria  or ataxia. .  Gait and station: Patient walks without assistive device and is able and assisted stool climb up to the exam table.  Strength within normal limits. Stance is stable, she trembles but is not swaying and normal. Tandem gait is  unfragmented. Romberg testing is normal.  Deep tendon reflexes: in the  upper and  lower extremities are symmetric and intact. Babinski maneuver response is  downgoing.   Assessment:  After physical and neurologic examination,  assessment is that of a coarse ,essential tremor- with an unusual age of onset and recent progression. I consider another  Tremor origin and  will order MRI brain . Beta blocker to be increased to 60 mg Xr form once in PM.   Refer for treatment of cervical dystonia , with Dr. Terrace Arabia or Hosie Poisson.   Please consider the cervical rib .     Plan:  Treatment plan and additional workup : records from Dr. Georgina Pillion , [facility]. BOTOX evaluation.

## 2012-10-20 ENCOUNTER — Ambulatory Visit
Admission: RE | Admit: 2012-10-20 | Discharge: 2012-10-20 | Disposition: A | Discharge status: Home / Self Care | Payer: Medicare Other | Source: Ambulatory Visit | Attending: Neurology | Admitting: Neurology

## 2012-10-20 DIAGNOSIS — G243 Spasmodic torticollis: Secondary | ICD-10-CM

## 2012-10-20 DIAGNOSIS — R251 Tremor, unspecified: Secondary | ICD-10-CM

## 2012-10-20 DIAGNOSIS — G252 Other specified forms of tremor: Secondary | ICD-10-CM

## 2012-10-20 DIAGNOSIS — R279 Unspecified lack of coordination: Secondary | ICD-10-CM

## 2012-10-20 MED ORDER — GADOBENATE DIMEGLUMINE 529 MG/ML IV SOLN
20.0000 mL | Freq: Once | INTRAVENOUS | Status: AC | PRN
  Administered 2012-10-20: 20 mL via INTRAVENOUS

## 2012-11-01 ENCOUNTER — Encounter: Payer: Self-pay | Admitting: Neurology

## 2012-11-01 ENCOUNTER — Ambulatory Visit (INDEPENDENT_AMBULATORY_CARE_PROVIDER_SITE_OTHER): Payer: Medicare Other | Admitting: Neurology

## 2012-11-01 VITALS — BP 128/79 | HR 79 | Ht 66.0 in | Wt 201.0 lb

## 2012-11-01 DIAGNOSIS — G243 Spasmodic torticollis: Secondary | ICD-10-CM

## 2012-11-01 DIAGNOSIS — G250 Essential tremor: Secondary | ICD-10-CM

## 2012-11-01 NOTE — Progress Notes (Signed)
Provider:  Dr Hosie Poisson Referring Provider: Gillian Scarce, MD Primary Care Physician:  Birdena Jubilee, MD  CC:  Tremor and neck pain  HPI:  Alyssa Macdonald is a 67 y.o. female here as a follow up of her tremor and neck pain. Last seen with Dr Vickey Huger on 10/12/2012 at which time she was started on propranolol 60mg  LA daily. She also had a brain MRI done which was unremarkable, showing mild chronic microvascular changes.  Reports improvement in hand tremor with increase in propranolol to 60mg  LA. Able to eat, hold utensils better. Tolerating propranolol well, no dizziness or lightheaded sensation.  Continues to have difficulty, pain with her neck. Reports having chronic neck pain for >5 years. Feels sensation of head rotating to the right, tilting to the right. Gets painful muscle spasms in R and L trapezius region. Also has a chronic "no-no" head tremor. This has not improved with the increase in propranolol. Pain and tremor are causing difficulty with ability to function throughout the day. Having severe neck pain and fatigue, worse as the day goes on. Requires her to lay down and rest. This tremor/pain has not responded to oral medications.   Concerns/Questions:Review of Systems: Out of a complete 14 system review, the patient complains of only the following symptoms, and all other reviewed systems are negative. Other for tremor and neck pain  History   Social History  . Marital Status: Married    Spouse Name: Alyssa Macdonald    Number of Children: 2  . Years of Education: 14   Occupational History  . RETIRED     Social History Main Topics  . Smoking status: Never Smoker   . Smokeless tobacco: Never Used  . Alcohol Use: Yes     Comment: Occasional,one glass of wine weekly  . Drug Use: No  . Sexual Activity: Yes    Partners: Male   Other Topics Concern  . Not on file   Social History Narrative   Marital Status: Married Air traffic controller)    Children:  Son Alyssa Macdonald) Daughter Alyssa Macdonald)    Pets: Dog  Berline Lopes)   Living Situation: Lives with spouse   Occupation: Retired     Education: Scientist, research (physical sciences) in Business    Tobacco Use/Exposure:  None    Alcohol Use:  Occasional 1-2 per week   Drug Use:  None   Diet:  Regular   Exercise:  None   Hobbies: Reading, Quilting                   Family History  Problem Relation Age of Onset  . CVA Mother   . Diabetes Father   . Parkinson's disease Paternal Uncle   . Cataracts Sister     Past Medical History  Diagnosis Date  . Hypertension   . Hyperlipidemia   . GERD (gastroesophageal reflux disease)   . Trigger finger   . Complication of anesthesia     slow to awaken  . Cervical dystonia   . Hypopotassemia   . 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 Osteoarthrosis involving, or with mention of more than one site, but not specified as generalized, site unspecified   . Osteoarthrosis involving, or with mention of more than one site, but not specified as generalized, site unspecified   . Essential and other specified forms of tremor   . Esophageal reflux   . Endometriosis     Past Surgical History  Procedure Laterality Date  . Joint replacement  bilateral  . Cholecystectomy    . Abdominal hysterectomy    . Appendectomy    . Colon surgery      bowel resection  . Tonsillectomy    . Foot surgery    . Trigger finger release  02/01/2012    Procedure: RELEASE TRIGGER FINGER/A-1 PULLEY;  Surgeon: Jodi Marble, MD;  Location: [facility];  Service: Orthopedics;  Laterality: Right;  RIGHT LONG FINGER  TRIGGER FINGER RELEASE    Current Outpatient Prescriptions  Medication Sig Dispense Refill  . aspirin 81 MG tablet Take 81 mg by mouth daily.      Marland Kitchen atorvastatin (LIPITOR) 10 MG tablet Take 1 tablet (10 mg total) by mouth daily.  30 tablet  11  . buPROPion (WELLBUTRIN XL) 300 MG 24 hr tablet Take 1 tablet (300 mg total) by mouth daily.  30 tablet  5  . clonazePAM (KLONOPIN) 0.5 MG tablet Take 1  tablet (0.5 mg total) by mouth 2 (two) times daily as needed.  60 tablet  5  . denosumab (PROLIA) 60 MG/ML SOLN injection Inject 60 mg into the skin every 6 (six) months. Administer in upper arm, thigh, or abdomen  1 mL  1  . diclofenac sodium (VOLTAREN) 1 % GEL Apply 4 g topically 4 (four) times daily.  5 Tube  2  . hydrochlorothiazide (HYDRODIURIL) 25 MG tablet Take 25 mg by mouth daily.      Marland Kitchen ibuprofen (ADVIL,MOTRIN) 800 MG tablet Take 800 mg by mouth every 8 (eight) hours as needed.      . multivitamin-iron-minerals-folic acid (CENTRUM) chewable tablet Chew 1 tablet by mouth daily.      Marland Kitchen omega-3 acid ethyl esters (LOVAZA) 1 G capsule Take 2 capsules (2 g total) by mouth 2 (two) times daily.  120 capsule  5  . omeprazole (PRILOSEC) 20 MG capsule Take 1 capsule (20 mg total) by mouth daily.  90 capsule  1  . potassium chloride (K-DUR,KLOR-CON) 10 MEQ tablet Take 1 tablet (10 mEq total) by mouth 2 (two) times daily.  60 tablet  11  . propranolol ER (INDERAL LA) 60 MG 24 hr capsule Take 1 capsule (60 mg total) by mouth as directed. One in PM po.  90 capsule  3  . risedronate (ACTONEL) 150 MG tablet Take 150 mg by mouth every 30 (thirty) days. with water on empty stomach, nothing by mouth or lie down for next 30 minutes.      . traMADol-acetaminophen (ULTRACET) 37.5-325 MG per tablet Take 1 tablet by mouth every 8 (eight) hours as needed for pain.  90 tablet  1   No current facility-administered medications for this visit.    Allergies as of 11/01/2012 - Review Complete 10/12/2012  Allergen Reaction Noted  . Zocor [simvastatin]  08/01/2012    Vitals: BP 128/79  Pulse 79  Ht 5\' 6"  (1.676 m)  Wt 201 lb (91.173 kg)  BMI 32.46 kg/m2 Last Weight:  Wt Readings from Last 1 Encounters:  11/01/12 201 lb (91.173 kg)   Last Height:   Ht Readings from Last 1 Encounters:  11/01/12 5\' 6"  (1.676 m)     Physical exam: Exam: Gen: NAD, conversant Eyes: anicteric sclerae, moist  conjunctivae HENT: Atraumatic, oropharynx clear Lungs: CTA, no wheezing, rales, rhonic                          CV: RRR, no MRG Abdomen: Soft, non-tender;  Extremities: No peripheral  edema  Skin: Normal temperature, no rash,  Psych: Appropriate affect, pleasant  Neurologic exam :  The patient is awake and alert, oriented to place and time. Memory subjective described as intact. There is a normal attention span & concentration ability.  Speech is fluent without dysarthria, but with dysphonia . Mood and affect are appropriate.   Cranial nerves:  Pupils are equal and briskly reactive to light. Funduscopic exam without evidence of pallor or edema. Extraocular movements in vertical and horizontal planes intact and without nystagmus.  Visual fields by finger perimetry are intact.  Hearing to finger rub intact. Facial sensation intact to fine touch. Facial motor strength is symmetric and tongue and uvula move midline.   Motor exam: "no-no" tremor of head noted, R torticolis, R laterocolis, hypertrophy L SCM, mild elevation R shoulder. Tenderness to palpation R trapezius and levator Scap Moves all extremities symmetrically and against light resistance  Sensory: Fine touch, pinprick and vibration were tested in all extremities. Proprioception is normal.   Coordination: no UE rest tremor, bilat hand postural and intention tremor  Gait and station: Patient walks without assistive device   Deep tendon reflexes: in the upper and lower extremities are symmetric and intact. Babinski maneuver response is downgoing.  Assessment:  After physical and neurologic examination, review of laboratory studies, imaging, neurophysiology testing and pre-existing records, assessment will be reviewed on the problem list.  Plan:  Treatment plan and additional workup will be reviewed under Problem List.  1)Essential tremor 2)Cervical dystonia with dystonic tremor  Ms Wierman is a pleasant 67y/o woman presented for  followup appointment. She has a history of chronic essential tremor and cervical neck pain. On exam she is noted to have a mild intention and postural tremor of both hands, is also noted to have a right laterocollis, right torticollis and elevation of the right shoulder. There is noted hypertrophy of the left sternocleidomastoid muscle. Her history is consistent with both an essential tremor and cervical dystonia. He cervical dystonia is causing her discomfort and causing excessive neck fatigue which is affecting her daily life. Discussed different therapeutic options, including botulinum toxin therapy. Patient will proceed at this time with botulinum toxin for symptomatic relief of her neck pain and dystonia. Will continue patient on current dose of propranolol.

## 2012-11-01 NOTE — Patient Instructions (Signed)
Overall you are doing fairly well but I do want to suggest a few things today:   As far as your medications are concerned, I would like to suggest you continue with the propranolol LA 60mg .   I think you will benefit from Botox injections for your cervical dystonia. I will place a referral with your insurance and we will notify you when it is improved.   My clinical assistant and will answer any of your questions and relay your messages to me and also relay most of my messages to you.   Our phone number is [phone]. We also have an after hours call service for urgent matters and there is a physician on-call for urgent questions. For any emergencies you know to call 911 or go to the nearest emergency room

## 2012-11-02 ENCOUNTER — Ambulatory Visit (INDEPENDENT_AMBULATORY_CARE_PROVIDER_SITE_OTHER): Payer: Medicare Other | Admitting: *Deleted

## 2012-11-02 DIAGNOSIS — M810 Age-related osteoporosis without current pathological fracture: Secondary | ICD-10-CM

## 2012-11-02 MED ORDER — DENOSUMAB 60 MG/ML ~~LOC~~ SOLN
60.0000 mg | Freq: Once | SUBCUTANEOUS | Status: AC
  Administered 2012-11-02: 60 mg via SUBCUTANEOUS

## 2012-11-04 ENCOUNTER — Ambulatory Visit (INDEPENDENT_AMBULATORY_CARE_PROVIDER_SITE_OTHER): Payer: Medicare Other | Admitting: Family Medicine

## 2012-11-04 ENCOUNTER — Encounter: Payer: Self-pay | Admitting: Family Medicine

## 2012-11-04 VITALS — BP 136/83 | HR 66 | Resp 16 | Wt 201.0 lb

## 2012-11-04 DIAGNOSIS — Z6379 Other stressful life events affecting family and household: Secondary | ICD-10-CM

## 2012-11-04 NOTE — Progress Notes (Signed)
Subjective:    Patient ID: Alyssa Macdonald, female    DOB: 08-08-1945, 67 y.o.   MRN: [id number]  HPI  Alyssa Macdonald is here today to discuss her anxiety and the stress she has been under for the past couple of weeks.  Her husband became ill and required admission to [facility] on 10/19/2012.  She discovered when he was admitted that he apparently has been consuming a large amount of alcohol and he had to be closely observed for DTs.  She discovered bottles of alcohol in his closet which has been very upsetting for her.  She also says that she has learned that he appears to be suffering from PTSD.  He recently shared a story with her that had haunted him for the past 50 years.  He was transferred from [facility] to [facility] which is a skilled nursing facility.  He is receiving PT and speech therapy.  She has been spending long hours with him and feels very overwhelmed.     Review of Systems  Constitutional: Negative.   HENT: Negative.   Eyes: Negative.   Respiratory: Negative.   Cardiovascular: Negative.   Gastrointestinal: Negative.   Endocrine: Negative.   Genitourinary: Negative.   Musculoskeletal: Negative.   Skin: Negative.   Allergic/Immunologic: Negative.   Neurological: Negative.   Hematological: Negative.   Psychiatric/Behavioral: The patient is nervous/anxious.     Past Medical History  Diagnosis Date  . Hypertension   . Hyperlipidemia   . GERD (gastroesophageal reflux disease)   . Trigger finger   . Complication of anesthesia     slow to awaken  . Cervical dystonia   . Hypopotassemia   . 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 Osteoarthrosis involving, or with mention of more than one site, but not specified as generalized, site unspecified   . Osteoarthrosis involving, or with mention of more than one site, but not specified as generalized, site unspecified   . Essential and other specified forms of tremor   . Esophageal reflux    . Endometriosis      Family History  Problem Relation Age of Onset  . CVA Mother   . Diabetes Father   . Parkinson's disease Paternal Uncle   . Cataracts Sister      History   Social History Narrative   Marital Status: Married Air traffic controller)    Children:  Son Vilinda Blanks) Daughter Tobi Bastos)    Pets: Dog Berline Lopes)   Living Situation: Lives with spouse   Occupation: Retired     Education: Scientist, research (physical sciences) in Business    Tobacco Use/Exposure:  None    Alcohol Use:  Occasional 1-2 per week   Drug Use:  None   Diet:  Regular   Exercise:  None   Hobbies: Reading, Quilting   Patient is right handed                       Objective:   Physical Exam  Vitals reviewed. Constitutional: She is oriented to person, place, and time. She appears well-developed and well-nourished. No distress.  Cardiovascular: Normal rate and regular rhythm.   Pulmonary/Chest: Effort normal and breath sounds normal.  Neurological: She is alert and oriented to person, place, and time.  Skin: Skin is warm and dry.  Psychiatric: She has a normal mood and affect. Her behavior is normal. Judgment and thought content normal.  She is stressed and worried about her husband.  Assessment & Plan:

## 2012-11-05 DIAGNOSIS — Z6379 Other stressful life events affecting family and household: Secondary | ICD-10-CM | POA: Insufficient documentation

## 2012-11-05 NOTE — Assessment & Plan Note (Signed)
Alyssa Macdonald is coping with many issues right now. She needs to get [name] into the [facility] to be evaluated for PTSD which may explain some of what is going on.  A letter was done for [name] which she will give to a contact she has at the [facility] in [place].  She feels that once he gets the help that he needs that she will be doing better.

## 2012-11-05 NOTE — Patient Instructions (Addendum)
Post-Traumatic Stress Disorder If you have been diagnosed with post-traumatic stress disorder (PTSD), you have probably experienced a traumatic event in your life. These events are usually outside of the range of normal human experience and would negatively impact any normal person.  CAUSES  A person can get PTSD after living through or seeing a dangerous event such as:  An automobile accident.  War.  Natural disaster.  Rape.  Domestic violence.  Any event where there has been a threat to life. PTSD is a real illness. PTSD Can happen to anyone at any age. Children get PTSD too. A doctor, or mental health professional with experience in treating PTSD can help you. SYMPTOMS  Not all symptoms may be present in any one person.  Distressing dreams.  Flashback: feeling the frightening event is happening again.  Avoiding activities, places, and people that remind you of the event.  Avoiding thoughts and feelings associated with the event.  Having frightening thoughts you cannot control.  Feeling on the edge with increased alertness and vigilance.  Trouble sleeping.  Feeling alone, detached from others.  Angry outbursts.  Feeling worried, guilty, or sad.  Having thoughts of hurting yourself or others. PTSD may start soon after a frightening event or months or years later. Many war veterans have PTSD. Drinking alcohol or using drugs will not help PTSD and may even make it worse.  TREATMENT  PTSD can be treated. Treatment may include "talk" therapy, medicine, or both. Either a doctor or a mental health professional who is experienced in treating PTSD can help you. Early diagnosis and treatment is best and can show more rapid improvement. Get help if you or a loved one are thinking of hurting yourself. Call your local emergency medical services if you need help immediately. Document Released: 10/14/2000 Document Revised: 04/13/2011 Document Reviewed: 09/28/2007 [facility] Patient  Information 2014 [place], [place].

## 2012-11-10 ENCOUNTER — Telehealth: Payer: Self-pay

## 2012-11-10 NOTE — Telephone Encounter (Signed)
I called and left VM that recent radiology testing was normal. Please call [phone] with any questions.

## 2012-11-10 NOTE — Telephone Encounter (Signed)
Message copied by [facility] on Thu Nov 10, 2012  3:31 PM ------      Message from: [facility], [place]      Created: Wed Nov 09, 2012  7:15 PM       Normal MRI for age. ------

## 2012-11-11 ENCOUNTER — Other Ambulatory Visit: Payer: Self-pay | Admitting: Family Medicine

## 2012-11-11 DIAGNOSIS — F4321 Adjustment disorder with depressed mood: Secondary | ICD-10-CM

## 2012-11-11 MED ORDER — DIAZEPAM 5 MG PO TABS: 5.0000 mg | ORAL_TABLET | Freq: Two times a day (BID) | ORAL | Status: DC | PRN

## 2012-11-14 ENCOUNTER — Encounter: Payer: Self-pay | Admitting: Family Medicine

## 2012-11-14 ENCOUNTER — Ambulatory Visit (INDEPENDENT_AMBULATORY_CARE_PROVIDER_SITE_OTHER): Payer: Medicare Other | Admitting: Family Medicine

## 2012-11-14 VITALS — BP 112/76 | HR 55 | Wt 198.0 lb

## 2012-11-14 DIAGNOSIS — R300 Dysuria: Secondary | ICD-10-CM

## 2012-11-14 DIAGNOSIS — R350 Frequency of micturition: Secondary | ICD-10-CM

## 2012-11-14 MED ORDER — SULFAMETHOXAZOLE-TRIMETHOPRIM 800-160 MG PO TABS: 1.0000 | ORAL_TABLET | Freq: Two times a day (BID) | ORAL | Status: DC

## 2012-11-14 NOTE — Progress Notes (Signed)
  Subjective:    Patient ID: Alyssa Macdonald, female    DOB: [birth year], 67 y.o.   MRN: [id number]  Alyssa Macdonald is here today complaining of UTI symptoms.    Urinary Tract Infection  This is a new problem. The current episode started in the past 7 days. The problem occurs every urination. The problem has been gradually worsening. The patient is experiencing no pain. There has been no fever. She is not sexually active. There is no history of pyelonephritis. Associated symptoms include chills and frequency. Treatments tried: AZO  The treatment provided no relief.      Review of Systems  Constitutional: Positive for chills.  HENT: Negative.   Eyes: Negative.   Respiratory: Negative.   Cardiovascular: Negative.   Gastrointestinal: Negative.   Endocrine: Negative.   Genitourinary: Positive for frequency.  Musculoskeletal: Negative.   Skin: Negative.   Allergic/Immunologic: Negative.   Neurological: Negative.   Hematological: Negative.   Psychiatric/Behavioral: Negative.      Past Medical History  Diagnosis Date  . Hypertension   . Hyperlipidemia   . GERD (gastroesophageal reflux disease)   . Trigger finger   . Complication of anesthesia     slow to awaken  . Cervical dystonia   . Hypopotassemia   . 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 Osteoarthrosis involving, or with mention of more than one site, but not specified as generalized, site unspecified   . Osteoarthrosis involving, or with mention of more than one site, but not specified as generalized, site unspecified   . Essential and other specified forms of tremor   . Esophageal reflux   . Endometriosis     Family History  Problem Relation Age of Onset  . CVA Mother   . Diabetes Father   . Parkinson's disease Paternal Uncle   . Cataracts Sister      History   Social History Narrative   Marital Status: Married Air traffic controller)    Children:  Son Vilinda Blanks) Daughter Tobi Bastos)    Pets: Dog Berline Lopes)   Living Situation:  Lives with spouse   Occupation: Retired     Education: Scientist, research (physical sciences) in Business    Tobacco Use/Exposure:  None    Alcohol Use:  Occasional 1-2 per week   Drug Use:  None   Diet:  Regular   Exercise:  None   Hobbies: Reading, Quilting   Patient is right handed                     Objective:   Physical Exam  Vitals reviewed. Constitutional: She is oriented to person, place, and time. She appears well-developed and well-nourished. No distress.  Cardiovascular: Normal rate, regular rhythm and normal heart sounds.   Pulmonary/Chest: Effort normal and breath sounds normal.  Abdominal: She exhibits no mass. There is tenderness. There is no rebound, no guarding and no CVA tenderness.  Neurological: She is alert and oriented to person, place, and time.  Skin: Skin is warm and dry. No rash noted.  Psychiatric: She has a normal mood and affect.      Assessment & Plan:    Alyssa Macdonald was seen today for urinary tract infection.  Diagnoses and associated orders for this visit:  Increased urinary frequency - Urine Culture  Dysuria -  sulfamethoxazole-trimethoprim (BACTRIM DS,SEPTRA DS) 800-160 MG per tablet; Take 1 tablet by mouth 2 (two) times daily.

## 2012-11-16 ENCOUNTER — Telehealth: Payer: Self-pay | Admitting: *Deleted

## 2012-11-16 LAB — URINE CULTURE
Colony Count: NO GROWTH
Organism ID, Bacteria: NO GROWTH

## 2012-11-16 NOTE — Telephone Encounter (Signed)
Alyssa Macdonald is aware of her urine culture did not grow any bacteria.  She was instructed to stop her antibiotic treatment and to contact us if her symptoms return.  PG

## 2012-12-11 ENCOUNTER — Encounter: Payer: Self-pay | Admitting: Family Medicine

## 2012-12-11 DIAGNOSIS — K219 Gastro-esophageal reflux disease without esophagitis: Secondary | ICD-10-CM | POA: Insufficient documentation

## 2012-12-11 DIAGNOSIS — E785 Hyperlipidemia, unspecified: Secondary | ICD-10-CM | POA: Insufficient documentation

## 2012-12-11 DIAGNOSIS — G8929 Other chronic pain: Secondary | ICD-10-CM | POA: Insufficient documentation

## 2012-12-11 DIAGNOSIS — Z23 Encounter for immunization: Secondary | ICD-10-CM | POA: Insufficient documentation

## 2012-12-11 NOTE — Assessment & Plan Note (Signed)
She was given a prescription for Wellbutrin XL.

## 2012-12-11 NOTE — Assessment & Plan Note (Signed)
Refilled her Lovaza and Lipitor.

## 2012-12-11 NOTE — Assessment & Plan Note (Signed)
Refilled her omeprazole.

## 2012-12-11 NOTE — Assessment & Plan Note (Signed)
The patient confirmed that they are not allergic to eggs and have never had a bad reaction with the flu shot in the past.  The vaccination was given without difficulty.   

## 2012-12-11 NOTE — Assessment & Plan Note (Signed)
Refilled her K-Dur.   

## 2012-12-11 NOTE — Assessment & Plan Note (Signed)
Nariah has been on a bisphosphonate for > 5 years so Reclast is not her best option.  She'll consider Prolia.

## 2012-12-11 NOTE — Assessment & Plan Note (Signed)
Refilled her Ultracet and Voltaren Gel.

## 2012-12-19 ENCOUNTER — Encounter: Payer: Self-pay | Admitting: Family Medicine

## 2012-12-22 ENCOUNTER — Ambulatory Visit (INDEPENDENT_AMBULATORY_CARE_PROVIDER_SITE_OTHER): Payer: Medicare Other | Admitting: Neurology

## 2012-12-22 ENCOUNTER — Encounter: Payer: Self-pay | Admitting: Neurology

## 2012-12-22 VITALS — Ht 66.0 in | Wt 204.0 lb

## 2012-12-22 DIAGNOSIS — G243 Spasmodic torticollis: Secondary | ICD-10-CM

## 2012-12-22 NOTE — Progress Notes (Signed)
[facility]   Provider:  Dr Hosie Poisson Referring Provider: Gillian Scarce, MD Primary Care Physician:  Birdena Jubilee, MD  CC: cervical dystonia  HPI:  Alyssa Macdonald is a 67 y.o. female here for initial Xeomin injections for spasmodic torticollis.   Since last visit continues to have difficulty with neck tremor and pain. Reports hand tremor well controlled with propranolol.   Patients husband unfortunately passed away since last visit.   Contraindications and precautions discussed with patient. EMG guidance was used to inject muscles detailed below. Aseptic procedure was observed and patient tolerated procedure. Procedure performed by Dr. Elspeth Cho.  The condition has existed for more than 6 months, and pt does not have a diagnosis of ALS, Myasthenia Gravis or Lambert-Eaton Syndrome.  Risks and benefits of injections discussed and pt agrees to proceed with the procedure.  Written consent obtained These injections are medically necessary. He receives good benefits from these injections. These injections do not cause sedations or hallucinations which the oral therapies may cause.  Physical Exam: R torticolis, elevation L shoulder, "no-no" dystonic head tremor, elevation L shoulder  Indication/Diagnosis: cervical dystonia  Type of toxin:Xeomin  Lot #[id number]  Expiration date:02/2015  Injection sites: L SCM 20 R SCM 10 L Semispinalis 10 R Semispinalis 10 L levator scap 20 Bilat "crows feet" 2.5 x 2 (R and L)      History   Social History  . Marital Status: Married    Spouse Name: [name]    Number of Children: 2  . Years of Education: 14   Occupational History  . RETIRED     Social History Main Topics  . Smoking status: Never Smoker   . Smokeless tobacco: Never Used  . Alcohol Use: Yes     Comment: Occasional,one glass of wine weekly  . Drug Use: No  . Sexual Activity: Not Currently    Partners: Male   Other Topics Concern  . Not on file    Social History Narrative   Marital Status:Widowed [name])    Children:  Son Vilinda Blanks) Daughter Tobi Bastos)    Pets: Dog Berline Lopes)   Living Situation: Lives alone   Occupation: Retired     Education: Scientist, research (physical sciences) in Business    Tobacco Use/Exposure:  None    Alcohol Use:  Occasional 1-2 per week   Drug Use:  None   Diet:  Regular   Exercise:  None   Hobbies: Reading, Quilting   Patient is right handed                      Family History  Problem Relation Age of Onset  . CVA Mother   . Diabetes Father   . Parkinson's disease Paternal Uncle   . Cataracts Sister     Past Medical History  Diagnosis Date  . Hypertension   . Hyperlipidemia   . GERD (gastroesophageal reflux disease)   . Trigger finger   . Complication of anesthesia     slow to awaken  . Cervical dystonia   . Hypopotassemia   . 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 Osteoarthrosis involving, or with mention of more than one site, but not specified as generalized, site unspecified   . Osteoarthrosis involving, or with mention of more than one site, but not specified as generalized, site unspecified   . Essential and other specified forms of tremor   . Esophageal reflux   . Endometriosis     Past Surgical  History  Procedure Laterality Date  . Joint replacement      bilateral  . Cholecystectomy    . Abdominal hysterectomy    . Appendectomy    . Colon surgery      bowel resection  . Tonsillectomy    . Foot surgery    . Trigger finger release  02/01/2012    Procedure: RELEASE TRIGGER FINGER/A-1 PULLEY;  Surgeon: Jodi Marble, MD;  Location: [facility];  Service: Orthopedics;  Laterality: Right;  RIGHT LONG FINGER  TRIGGER FINGER RELEASE    Current Outpatient Prescriptions  Medication Sig Dispense Refill  . aspirin 81 MG tablet Take 81 mg by mouth daily.      Marland Kitchen atorvastatin (LIPITOR) 10 MG tablet Take 1 tablet (10 mg total) by mouth daily.  30 tablet  11  .  buPROPion (WELLBUTRIN XL) 300 MG 24 hr tablet Take 1 tablet (300 mg total) by mouth daily.  30 tablet  5  . clonazePAM (KLONOPIN) 0.5 MG tablet Take 1 tablet (0.5 mg total) by mouth 2 (two) times daily as needed.  60 tablet  5  . hydrochlorothiazide (HYDRODIURIL) 25 MG tablet Take 25 mg by mouth daily. 1/2 tablet daily      . multivitamin-iron-minerals-folic acid (CENTRUM) chewable tablet Chew 1 tablet by mouth daily.      Marland Kitchen omega-3 acid ethyl esters (LOVAZA) 1 G capsule Take 2 capsules (2 g total) by mouth 2 (two) times daily.  120 capsule  5  . omeprazole (PRILOSEC) 20 MG capsule Take 1 capsule (20 mg total) by mouth daily.  90 capsule  1  . potassium chloride (K-DUR) 10 MEQ tablet       . potassium chloride (K-DUR,KLOR-CON) 10 MEQ tablet Take 1 tablet (10 mEq total) by mouth 2 (two) times daily.  60 tablet  11  . propranolol (INDERAL) 10 MG tablet Take 10 mg by mouth daily.      . risedronate (ACTONEL) 150 MG tablet Take 150 mg by mouth every 30 (thirty) days. with water on empty stomach, nothing by mouth or lie down for next 30 minutes.      Juan Quam 50 UNITS SOLR injection       . diazepam (VALIUM) 5 MG tablet Take 1 tablet (5 mg total) by mouth every 12 (twelve) hours as needed for anxiety.  6 tablet  0  . diclofenac sodium (VOLTAREN) 1 % GEL Apply 4 g topically 4 (four) times daily.  5 Tube  2  . ibuprofen (ADVIL,MOTRIN) 800 MG tablet Take 800 mg by mouth every 8 (eight) hours as needed.      . predniSONE (DELTASONE) 5 MG tablet Take 5 mg by mouth daily. One tablet every three days      . traMADol-acetaminophen (ULTRACET) 37.5-325 MG per tablet Take 1 tablet by mouth every 8 (eight) hours as needed for pain.  90 tablet  1   No current facility-administered medications for this visit.    Allergies as of 12/22/2012 - Review Complete 12/22/2012  Allergen Reaction Noted  . Zocor [simvastatin]  08/01/2012    Vitals: Ht 5\' 6"  (1.676 m)  Wt 204 lb (92.534 kg)  BMI 32.94 kg/m2 Last  Weight:  Wt Readings from Last 1 Encounters:  12/22/12 204 lb (92.534 kg)   Last Height:   Ht Readings from Last 1 Encounters:  12/22/12 5\' 6"  (1.676 m)      Assessment:  After physical and neurologic examination, review of laboratory studies, imaging, neurophysiology  testing and pre-existing records, assessment will be reviewed on the problem list.  Plan:  Treatment plan and additional workup will be reviewed under Problem List.  Alyssa Macdonald is a 67 y.o. female here for botulinum toxin injections. They tolerated procedure well with no complications.   1) Botox injections as detailed above. A total of 75 units was used. Will plan to order 150 units for next visit 2) Tylenol or Motrin for injection site pain. 3) Medication guide dispensed. 4) Follow up for repeat injections in 3 months. Will follow up December 18 for re-check

## 2012-12-22 NOTE — Patient Instructions (Signed)
Follow up on December 18

## 2013-01-05 ENCOUNTER — Encounter: Payer: Self-pay | Admitting: *Deleted

## 2013-01-19 ENCOUNTER — Encounter: Payer: Self-pay | Admitting: Neurology

## 2013-01-19 ENCOUNTER — Ambulatory Visit (INDEPENDENT_AMBULATORY_CARE_PROVIDER_SITE_OTHER): Payer: Medicare Other | Admitting: Neurology

## 2013-01-19 VITALS — BP 111/73 | HR 70 | Ht 66.5 in | Wt 200.0 lb

## 2013-01-19 DIAGNOSIS — G250 Essential tremor: Secondary | ICD-10-CM

## 2013-01-19 DIAGNOSIS — G243 Spasmodic torticollis: Secondary | ICD-10-CM

## 2013-01-19 NOTE — Progress Notes (Signed)
Provider:  Dr Hosie Poisson Referring Provider: Gillian Scarce, MD Primary Care Physician:  Birdena Jubilee, MD  CC:  Tremor and neck pain  HPI:  Alyssa Macdonald is a 67 y.o. female here as a follow up of her tremor and neck pain. She is s/p her first Xeomin injections on 12/2012. Since then she notes some improvement in her symptoms, tremor is slightly less severe. Continues to have difficulty with pain/discomfort in bilateral trapezius L>R. Tolerated injections well with no adverse effects.    Concerns/Questions:Review of Systems: Out of a complete 14 system review, the patient complains of only the following symptoms, and all other reviewed systems are negative. Other for tremor and neck pain  History   Social History  . Marital Status: Widowed    Spouse Name: [name]    Number of Children: 2  . Years of Education: 14   Occupational History  . RETIRED     Social History Main Topics  . Smoking status: Never Smoker   . Smokeless tobacco: Never Used  . Alcohol Use: Yes     Comment: Occasional,one glass of wine weekly  . Drug Use: No  . Sexual Activity: Not Currently    Partners: Male   Other Topics Concern  . Not on file   Social History Narrative   Marital Status:Widowed [name])    Children:  Son Vilinda Blanks) Daughter Tobi Bastos)    Pets: Dog Berline Lopes)   Living Situation: Lives alone   Occupation: Retired     Education: Scientist, research (physical sciences) in Business    Tobacco Use/Exposure:  None    Alcohol Use:  Occasional 1-2 per week   Drug Use:  None   Diet:  Regular   Exercise:  None   Hobbies: Reading, Quilting   Patient is right handed                      Family History  Problem Relation Age of Onset  . CVA Mother   . Diabetes Father   . Parkinson's disease Paternal Uncle   . Cataracts Sister     Past Medical History  Diagnosis Date  . Hypertension   . Hyperlipidemia   . GERD (gastroesophageal reflux disease)   . Trigger finger   . Complication of anesthesia     slow to  awaken  . Cervical dystonia   . Hypopotassemia   . 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 Osteoarthrosis involving, or with mention of more than one site, but not specified as generalized, site unspecified   . Osteoarthrosis involving, or with mention of more than one site, but not specified as generalized, site unspecified   . Essential and other specified forms of tremor   . Esophageal reflux   . Endometriosis     Past Surgical History  Procedure Laterality Date  . Joint replacement      bilateral  . Cholecystectomy    . Abdominal hysterectomy    . Appendectomy    . Colon surgery      bowel resection  . Tonsillectomy    . Foot surgery    . Trigger finger release  02/01/2012    Procedure: RELEASE TRIGGER FINGER/A-1 PULLEY;  Surgeon: Jodi Marble, MD;  [facility];  Service: Orthopedics;  Laterality: Right;  RIGHT LONG FINGER  TRIGGER FINGER RELEASE    Current Outpatient Prescriptions  Medication Sig Dispense Refill  . aspirin 81 MG tablet Take 81 mg by mouth daily.      Marland Kitchen  atorvastatin (LIPITOR) 10 MG tablet Take 1 tablet (10 mg total) by mouth daily.  30 tablet  11  . buPROPion (WELLBUTRIN XL) 300 MG 24 hr tablet Take 1 tablet (300 mg total) by mouth daily.  30 tablet  5  . clonazePAM (KLONOPIN) 0.5 MG tablet Take 1 tablet (0.5 mg total) by mouth 2 (two) times daily as needed.  60 tablet  5  . diazepam (VALIUM) 5 MG tablet Take 1 tablet (5 mg total) by mouth every 12 (twelve) hours as needed for anxiety.  6 tablet  0  . diclofenac sodium (VOLTAREN) 1 % GEL Apply 4 g topically 4 (four) times daily.  5 Tube  2  . hydrochlorothiazide (HYDRODIURIL) 25 MG tablet Take 25 mg by mouth daily. 1/2 tablet daily      . ibuprofen (ADVIL,MOTRIN) 800 MG tablet Take 800 mg by mouth every 8 (eight) hours as needed.      . multivitamin-iron-minerals-folic acid (CENTRUM) chewable tablet Chew 1 tablet by mouth daily.      Marland Kitchen omega-3 acid ethyl esters (LOVAZA)  1 G capsule Take 2 capsules (2 g total) by mouth 2 (two) times daily.  120 capsule  5  . omeprazole (PRILOSEC) 20 MG capsule Take 1 capsule (20 mg total) by mouth daily.  90 capsule  1  . potassium chloride (K-DUR) 10 MEQ tablet       . potassium chloride (K-DUR,KLOR-CON) 10 MEQ tablet Take 1 tablet (10 mEq total) by mouth 2 (two) times daily.  60 tablet  11  . predniSONE (DELTASONE) 5 MG tablet Take 5 mg by mouth daily. One tablet every three days      . propranolol (INDERAL) 10 MG tablet Take 10 mg by mouth daily.      . risedronate (ACTONEL) 150 MG tablet Take 150 mg by mouth every 30 (thirty) days. with water on empty stomach, nothing by mouth or lie down for next 30 minutes.      . traMADol-acetaminophen (ULTRACET) 37.5-325 MG per tablet Take 1 tablet by mouth every 8 (eight) hours as needed for pain.  90 tablet  1  . XEOMIN 50 UNITS SOLR injection        No current facility-administered medications for this visit.    Allergies as of 01/19/2013 - Review Complete 01/19/2013  Allergen Reaction Noted  . Zocor [simvastatin]  08/01/2012    Vitals: BP 111/73  Pulse 70  Ht 5' 6.5" (1.689 m)  Wt 200 lb (90.719 kg)  BMI 31.80 kg/m2 Last Weight:  Wt Readings from Last 1 Encounters:  01/19/13 200 lb (90.719 kg)   Last Height:   Ht Readings from Last 1 Encounters:  01/19/13 5' 6.5" (1.689 m)     Physical exam: Exam: Gen: NAD, conversant Eyes: anicteric sclerae, moist conjunctivae HENT: Atraumatic, oropharynx clear Extremities: No peripheral edema  Skin: Normal temperature, no rash,  Psych: Appropriate affect, pleasant  Neurologic exam :  The patient is awake and alert, oriented to place and time.   Cranial nerves:  Extraocular movements in vertical and horizontal planes intact and without nystagmus.  Facial sensation intact to fine touch. Facial motor strength is symmetric and tongue and uvula move midline.   Motor exam: Physical exam today notes a continued "no-no" tremor  with a L laterolcolis, L torticolis, elevation L shoulder, tenderness to palpation over bilateral trapezius region  Coordination: no UE rest tremor, bilat hand postural and intention tremor  Gait and station: Patient walks without assistive device  Assessment:  After physical and neurologic examination, review of laboratory studies, imaging, neurophysiology testing and pre-existing records, assessment will be reviewed on the problem list.  Plan:  Treatment plan and additional workup will be reviewed under Problem List.  1)Essential tremor 2)Cervical dystonia with dystonic tremor  Patient received good benefit from initial Xeomin injections. Is currently 1 month out, benefit is starting to resolve. Will continue with current muscles and plan to add L splenius capitis and L trapezius to next injection appointment. Referral placed for PT for stretching/symptomatic relief. Follow up in 2 months for repeat injections.

## 2013-01-19 NOTE — Patient Instructions (Signed)
Overall you are doing fairly well but I do want to suggest a few things today:   We placed a referral for physical therapy.   We will plan to see you back in 2 months for repeat injections  Please also call us for any test results so we can go over those with you on the phone.  My clinical assistant and will answer any of your questions and relay your messages to me and also relay most of my messages to you.   Our phone number is [phone]. We also have an after hours call service for urgent matters and there is a physician on-call for urgent questions. For any emergencies you know to call 911 or go to the nearest emergency room

## 2013-01-29 ENCOUNTER — Other Ambulatory Visit: Payer: Self-pay | Admitting: Family Medicine

## 2013-02-06 ENCOUNTER — Encounter: Payer: Self-pay | Admitting: *Deleted

## 2013-02-07 ENCOUNTER — Encounter: Payer: Self-pay | Admitting: Family Medicine

## 2013-02-07 ENCOUNTER — Other Ambulatory Visit: Payer: Self-pay | Admitting: Family Medicine

## 2013-02-07 ENCOUNTER — Ambulatory Visit (INDEPENDENT_AMBULATORY_CARE_PROVIDER_SITE_OTHER): Payer: Medicare Other | Admitting: Family Medicine

## 2013-02-07 VITALS — BP 134/71 | HR 58 | Resp 16 | Wt 201.0 lb

## 2013-02-07 DIAGNOSIS — F329 Major depressive disorder, single episode, unspecified: Secondary | ICD-10-CM

## 2013-02-07 DIAGNOSIS — F3289 Other specified depressive episodes: Secondary | ICD-10-CM

## 2013-02-07 DIAGNOSIS — F411 Generalized anxiety disorder: Secondary | ICD-10-CM

## 2013-02-07 DIAGNOSIS — R251 Tremor, unspecified: Secondary | ICD-10-CM

## 2013-02-07 DIAGNOSIS — F32A Depression, unspecified: Secondary | ICD-10-CM

## 2013-02-07 DIAGNOSIS — R259 Unspecified abnormal involuntary movements: Secondary | ICD-10-CM

## 2013-02-07 MED ORDER — CLONAZEPAM 0.5 MG PO TABS: 0.5000 mg | ORAL_TABLET | Freq: Two times a day (BID) | ORAL | Status: DC | PRN

## 2013-02-07 MED ORDER — BUPROPION HCL ER (XL) 150 MG PO TB24: 150.0000 mg | ORAL_TABLET | ORAL | Status: DC

## 2013-02-07 MED ORDER — ESCITALOPRAM OXALATE 5 MG PO TABS: 5.0000 mg | ORAL_TABLET | Freq: Every day | ORAL | Status: DC

## 2013-02-07 NOTE — Patient Instructions (Signed)
1)  Mood - Start on Lexapro 2.5 mg daily for 4 days (1/2 of the 5) then increase to 1 tab.  In a week, start on the Wellbutrin 150 mg.  Take both pills first thing in the morning.  F/U in one month and we'll decide what to do with each medication.    Possible Options  A)  Lexapro 10 mg        Wellbutrin XL 300 mg   B   We might consider splitting the Wellbutrin to the SR version and give it to you twice a day to give you more energy in the afternoon.     Grief Reaction Grief is a normal response to the death of someone close to you. Feelings of fear, anger, and guilt can affect almost everyone who loses someone they love. Symptoms of depression are also common. These include problems with sleep, loss of appetite, and lack of energy. These grief reaction symptoms often last for weeks to months after a loss. They may also return during special times that remind you of the person you lost, such as an anniversary or birthday. Anxiety, insomnia, irritability, and deep depression may last beyond the period of normal grief. If you experience these feelings for 6 months or longer, you may have clinical depression. Clinical depression requires further medical attention. If you think that you have clinical depression, you should contact your caregiver. If you have a history of depression and or a family history of depression, you are at greater risk of clinical depression. You are also at greater risk of developing clinical depression if the loss was traumatic or the loss was of someone with whom you had unresolved issues.  A grief reaction can become complicated by being blocked. This means being unable to cry or express extreme emotions. This may prolong the grieving period and worsen the emotional effects of the loss. Mourning is a natural event in human life. A healthy grief reaction is one that is not blocked . It requires a time of sadness and readjustment.It is very important to share your sorrow and fear  with others, especially close friends and family. Professional counselors and clergy can also help you process your grief. Document Released: 01/19/2005 Document Revised: 04/13/2011 Document Reviewed: 09/29/2005 [facility] Patient Information 2014 [place], [place].

## 2013-02-07 NOTE — Progress Notes (Signed)
   Subjective:    Patient ID: Alyssa Macdonald, female    DOB: 1945-02-18, 68 y.o.   MRN: [id number]  HPI  Alyssa Macdonald is here today to get her Klonopin refilled for her head tremor and to discuss her mood.  She lost her husband Alyssa Macdonald) back in October.  The holidays have been very hard for her and she feels that it may be time to take something for her mood.  She was given a prescription for Wellbutrin several months ago but never got it filled.     Review of Systems  Constitutional: Negative for fatigue.  Respiratory: Negative for shortness of breath.   Cardiovascular: Negative for chest pain.  Gastrointestinal: Negative for diarrhea and constipation.  Psychiatric/Behavioral: Positive for sleep disturbance, dysphoric mood and decreased concentration. The patient is nervous/anxious.        Objective:   Physical Exam  Constitutional: She is oriented to person, place, and time. She appears well-nourished. No distress.  Eyes: Conjunctivae are normal. No scleral icterus.  Neck: Neck supple. No thyromegaly present.  Cardiovascular: Normal rate, regular rhythm and normal heart sounds.   Pulmonary/Chest: Breath sounds normal. No respiratory distress.  Neurological: She is alert and oriented to person, place, and time.  Skin: Skin is warm and dry.  Psychiatric: Her speech is normal and behavior is normal. Judgment and thought content normal. Her affect is not inappropriate. Cognition and memory are normal. She exhibits a depressed mood (Tearful ).      Assessment & Plan:    Alyssa Macdonald was seen today for medication refill.  Diagnoses and associated orders for this visit:  Anxiety state, unspecified - escitalopram (LEXAPRO) 5 MG tablet; Take 1 tablet (5 mg total) by mouth daily.  Depression (emotion) - buPROPion (WELLBUTRIN XL) 150 MG 24 hr tablet; Take 1 tablet (150 mg total) by mouth every morning.  Tremors of nervous system - clonazePAM (KLONOPIN) 0.5 MG tablet; Take 1 tablet (0.5 mg total)  by mouth 2 (two) times daily as needed.   TIME SPENT "FACE TO FACE" WITH PATIENT -  30 MINS

## 2013-02-13 ENCOUNTER — Ambulatory Visit: Payer: Medicare Other | Attending: Neurology | Admitting: Rehabilitative and Restorative Service Providers"

## 2013-02-13 DIAGNOSIS — R293 Abnormal posture: Secondary | ICD-10-CM | POA: Insufficient documentation

## 2013-02-13 DIAGNOSIS — IMO0001 Reserved for inherently not codable concepts without codable children: Secondary | ICD-10-CM | POA: Insufficient documentation

## 2013-03-01 ENCOUNTER — Ambulatory Visit: Payer: Medicare Other | Admitting: Rehabilitative and Restorative Service Providers"

## 2013-03-07 ENCOUNTER — Ambulatory Visit: Payer: Medicare Other | Attending: Neurology | Admitting: Rehabilitative and Restorative Service Providers"

## 2013-03-07 DIAGNOSIS — R293 Abnormal posture: Secondary | ICD-10-CM | POA: Insufficient documentation

## 2013-03-07 DIAGNOSIS — IMO0001 Reserved for inherently not codable concepts without codable children: Secondary | ICD-10-CM | POA: Insufficient documentation

## 2013-03-13 ENCOUNTER — Ambulatory Visit (INDEPENDENT_AMBULATORY_CARE_PROVIDER_SITE_OTHER): Payer: Medicare Other | Admitting: Family Medicine

## 2013-03-13 ENCOUNTER — Encounter: Payer: Self-pay | Admitting: Family Medicine

## 2013-03-13 VITALS — BP 111/67 | HR 58 | Resp 16 | Wt 202.0 lb

## 2013-03-13 DIAGNOSIS — K219 Gastro-esophageal reflux disease without esophagitis: Secondary | ICD-10-CM

## 2013-03-13 DIAGNOSIS — M25562 Pain in left knee: Secondary | ICD-10-CM

## 2013-03-13 DIAGNOSIS — M25561 Pain in right knee: Secondary | ICD-10-CM

## 2013-03-13 DIAGNOSIS — E785 Hyperlipidemia, unspecified: Secondary | ICD-10-CM

## 2013-03-13 DIAGNOSIS — M25569 Pain in unspecified knee: Secondary | ICD-10-CM

## 2013-03-13 DIAGNOSIS — R252 Cramp and spasm: Secondary | ICD-10-CM

## 2013-03-13 MED ORDER — TRAMADOL-ACETAMINOPHEN 37.5-325 MG PO TABS: ORAL_TABLET | ORAL | Status: DC

## 2013-03-13 MED ORDER — OMEGA-3-ACID ETHYL ESTERS 1 G PO CAPS: 2.0000 g | ORAL_CAPSULE | Freq: Two times a day (BID) | ORAL | Status: DC

## 2013-03-13 MED ORDER — OMEPRAZOLE 20 MG PO CPDR: 20.0000 mg | DELAYED_RELEASE_CAPSULE | Freq: Every day | ORAL | Status: DC

## 2013-03-13 MED ORDER — IBUPROFEN 800 MG PO TABS
800.0000 mg | ORAL_TABLET | Freq: Three times a day (TID) | ORAL | Status: AC
Start: 2013-03-13 — End: 2014-03-13

## 2013-03-13 MED ORDER — ATORVASTATIN CALCIUM 10 MG PO TABS: 10.0000 mg | ORAL_TABLET | Freq: Every day | ORAL | Status: DC

## 2013-03-13 MED ORDER — POTASSIUM CHLORIDE CRYS ER 20 MEQ PO TBCR: 20.0000 meq | EXTENDED_RELEASE_TABLET | Freq: Every day | ORAL | Status: DC

## 2013-03-13 NOTE — Patient Instructions (Signed)
1)  Sleep - Decrease caffeine, Sleepy Time vs Sweet Dreams Tea, Hot Bath with Epsom Salt, Exercise, Zyrtec 10 mg at night   2)  Leg Cramps - Tiger Balm to legs    Insomnia Insomnia is frequent trouble falling and/or staying asleep. Insomnia can be a long term problem or a short term problem. Both are common. Insomnia can be a short term problem when the wakefulness is related to a certain stress or worry. Long term insomnia is often related to ongoing stress during waking hours and/or poor sleeping habits. Overtime, sleep deprivation itself can make the problem worse. Every little thing feels more severe because you are overtired and your ability to cope is decreased. CAUSES   Stress, anxiety, and depression.  Poor sleeping habits.  Distractions such as TV in the bedroom.  Naps close to bedtime.  Engaging in emotionally charged conversations before bed.  Technical reading before sleep.  Alcohol and other sedatives. They may make the problem worse. They can hurt normal sleep patterns and normal dream activity.  Stimulants such as caffeine for several hours prior to bedtime.  Pain syndromes and shortness of breath can cause insomnia.  Exercise late at night.  Changing time zones may cause sleeping problems (jet lag). It is sometimes helpful to have someone observe your sleeping patterns. They should look for periods of not breathing during the night (sleep apnea). They should also look to see how long those periods last. If you live alone or observers are uncertain, you can also be observed at a sleep clinic where your sleep patterns will be professionally monitored. Sleep apnea requires a checkup and treatment. Give your caregivers your medical history. Give your caregivers observations your family has made about your sleep.  SYMPTOMS   Not feeling rested in the morning.  Anxiety and restlessness at bedtime.  Difficulty falling and staying asleep. TREATMENT   Your caregiver may  prescribe treatment for an underlying medical disorders. Your caregiver can give advice or help if you are using alcohol or other drugs for self-medication. Treatment of underlying problems will usually eliminate insomnia problems.  Medications can be prescribed for short time use. They are generally not recommended for lengthy use.  Over-the-counter sleep medicines are not recommended for lengthy use. They can be habit forming.  You can promote easier sleeping by making lifestyle changes such as:  Using relaxation techniques that help with breathing and reduce muscle tension.  Exercising earlier in the day.  Changing your diet and the time of your last meal. No night time snacks.  Establish a regular time to go to bed.  Counseling can help with stressful problems and worry.  Soothing music and white noise may be helpful if there are background noises you cannot remove.  Stop tedious detailed work at least one hour before bedtime. HOME CARE INSTRUCTIONS   Keep a diary. Inform your caregiver about your progress. This includes any medication side effects. See your caregiver regularly. Take note of:  Times when you are asleep.  Times when you are awake during the night.  The quality of your sleep.  How you feel the next day. This information will help your caregiver care for you.  Get out of bed if you are still awake after 15 minutes. Read or do some quiet activity. Keep the lights down. Wait until you feel sleepy and go back to bed.  Keep regular sleeping and waking hours. Avoid naps.  Exercise regularly.  Avoid distractions at bedtime. Distractions include  watching television or engaging in any intense or detailed activity like attempting to balance the household checkbook.  Develop a bedtime ritual. Keep a familiar routine of bathing, brushing your teeth, climbing into bed at the same time each night, listening to soothing music. Routines increase the success of falling to  sleep faster.  Use relaxation techniques. This can be using breathing and muscle tension release routines. It can also include visualizing peaceful scenes. You can also help control troubling or intruding thoughts by keeping your mind occupied with boring or repetitive thoughts like the old concept of counting sheep. You can make it more creative like imagining planting one beautiful flower after another in your backyard garden.  During your day, work to eliminate stress. When this is not possible use some of the previous suggestions to help reduce the anxiety that accompanies stressful situations. MAKE SURE YOU:   Understand these instructions.  Will watch your condition.  Will get help right away if you are not doing well or get worse. Document Released: 01/17/2000 Document Revised: 04/13/2011 Document Reviewed: 02/16/2007 [facility] Patient Information 2014 [place].

## 2013-03-13 NOTE — Progress Notes (Signed)
Subjective:    Patient ID: Alyssa Macdonald, female    DOB: 11/29/1945, 68 y.o.   MRN: [id number]  HPI  [place] is here today to discuss the conditions listed below:   1)  Mood - She tried the combination of Lexapro and Wellbutrin and says that she did not like how it made her feel.  She says that both of these medications made her feel like "a zombie."  She feels that grieving her husband is a normal feeling and she does not want to feel so numb.  She has tried the Wellbutrin 1/2 tab on occasion which she feels makes her feel a little more calm.   2)  Knee Pain - She occasionally takes either Tramadol and/or Ibuprofen.  She needs a refill on both medications.  Her last refill expired.    3)  GERD - She takes omeprazole when she gets acid reflux but her medicine expired.    4)  Tremors - She needs a refill on her clonazepam.     Review of Systems  Gastrointestinal:       Negative for acid reflux  Musculoskeletal: Negative for arthralgias.       Controlled with Tramadol and Ibuprofen  Psychiatric/Behavioral: Negative for sleep disturbance. The patient is not nervous/anxious.    Past Medical History  Diagnosis Date  . Hypertension   . Hyperlipidemia   . GERD (gastroesophageal reflux disease)   . Trigger finger   . Complication of anesthesia     slow to awaken  . Cervical dystonia   . Hypopotassemia   . 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 Osteoarthrosis involving, or with mention of more than one site, but not specified as generalized, site unspecified   . Osteoarthrosis involving, or with mention of more than one site, but not specified as generalized, site unspecified   . Essential and other specified forms of tremor   . Esophageal reflux   . Endometriosis      Past Surgical History  Procedure Laterality Date  . Joint replacement      bilateral  . Cholecystectomy    . Abdominal hysterectomy    . Appendectomy    . Colon surgery      bowel resection  .  Tonsillectomy    . Foot surgery    . 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 CVA Mother   . Diabetes Father   . Parkinson's disease Paternal Uncle   . Cataracts Sister      Current Outpatient Prescriptions on File Prior to Visit  Medication  Sig Dispense Refill  . aspirin 81 MG tablet Take 81 mg by mouth daily.      . clonazePAM (KLONOPIN) 0.5 MG tablet Take 1 tablet (0.5 mg total) by mouth 2 (two) times daily as needed.  60 tablet  5  . multivitamin-iron-minerals-folic acid (CENTRUM) chewable tablet Chew 1 tablet by mouth daily.      . propranolol ER (INDERAL LA) 60 MG 24 hr capsule Take 60 mg by mouth daily.      . risedronate (ACTONEL) 150 MG tablet Take 150 mg by mouth every 30 (thirty) days. with water on empty stomach, nothing by mouth or lie down for next 30 minutes.      Su Hoff 50 UNITS SOLR injection        No current facility-administered medications on file prior to visit.     Allergies  Allergen Reactions  . Zocor [Simvastatin]     Myalgia     Immunization History  Administered Date(s) Administered  . Influenza,inj,Quad PF,36+ Mos 10/11/2012  . Pneumococcal-Unspecified 11/27/2003  . Td 08/29/2001, 05/16/2007  . Zoster 04/29/2006       Objective:   Physical  Exam  Vitals reviewed. Constitutional: She is oriented to person, place, and time.  Eyes: Conjunctivae are normal. No scleral icterus.  Neck: Neck supple. No thyromegaly present.  Cardiovascular: Normal rate, regular rhythm and normal heart sounds.   Pulmonary/Chest: Effort normal and breath sounds normal.  Musculoskeletal: She exhibits no edema and no tenderness.  Lymphadenopathy:    She has no cervical adenopathy.  Neurological: She is alert and oriented to person, place, and time.  Skin: Skin is warm and dry.  Psychiatric: She has a normal mood and affect. Her behavior is normal. Judgment and thought content normal.      Assessment & Plan:    Shonta was seen today for medication management.  Diagnoses and associated orders for this visit:  GERD (gastroesophageal reflux disease) - omeprazole (PRILOSEC) 20 MG capsule; Take 1 capsule (20 mg total) by mouth daily.  Other and unspecified hyperlipidemia - atorvastatin (LIPITOR) 10 MG tablet; Take 1 tablet (10 mg total) by mouth daily. - omega-3 acid ethyl esters (LOVAZA) 1 G capsule; Take 2 capsules (2 g total) by mouth 2 (two) times daily.  Leg cramps - potassium chloride (K-DUR,KLOR-CON) 20 MEQ tablet; Take 1 tablet (20 mEq total) by mouth daily.  Knee pain, bilateral - ibuprofen (ADVIL,MOTRIN) 800 MG tablet; Take 1 tablet (800 mg total) by mouth 3 (three) times daily. - traMADol-acetaminophen (ULTRACET) 37.5-325 MG per tablet; Take 1-2 tab daily prn increased pain

## 2013-03-24 ENCOUNTER — Encounter: Payer: Self-pay | Admitting: Neurology

## 2013-03-24 ENCOUNTER — Ambulatory Visit (INDEPENDENT_AMBULATORY_CARE_PROVIDER_SITE_OTHER): Payer: Medicare Other | Admitting: Neurology

## 2013-03-24 VITALS — BP 134/82 | HR 80 | Ht 66.0 in | Wt 204.0 lb

## 2013-03-24 DIAGNOSIS — G243 Spasmodic torticollis: Secondary | ICD-10-CM

## 2013-03-24 MED ORDER — INCOBOTULINUMTOXINA 100 UNITS IM SOLR: 100.0000 [IU] | Freq: Once | INTRAMUSCULAR | Status: DC

## 2013-03-24 MED ORDER — INCOBOTULINUMTOXINA 50 UNITS IM SOLR: 50.0000 [IU] | Freq: Once | INTRAMUSCULAR | Status: DC

## 2013-03-24 NOTE — Progress Notes (Signed)
[facility]   Provider:  Dr Janann Colonel Referring Provider: Jonathon Resides, MD Primary Care Physician:  Ival Bible, MD  CC: cervical dystonia  HPI:  Alyssa Macdonald is a 68 y.o. female here for follow up Xeomin injections for spasmodic torticollis. Initial injections were done on 12/22/2012 at which time she noted good benefit of her symptoms. She continues to have tremor and pain discomfort, predominantly in bilateral trapezius L>R.   Reports hand tremor well controlled with propranolol.   Contraindications and precautions discussed with patient. EMG guidance was used to inject muscles detailed below. Aseptic procedure was observed and patient tolerated procedure. Procedure performed by Dr. Jim Like.  The condition has existed for more than 6 months, and pt does not have a diagnosis of ALS, Myasthenia Gravis or Lambert-Eaton Syndrome.  Risks and benefits of injections discussed and pt agrees to proceed with the procedure.  Written consent obtained These injections are medically necessary. He receives good benefits from these injections. These injections do not cause sedations or hallucinations which the oral therapies may cause.  Physical Exam: R torticolis, elevation L shoulder, "no-no" dystonic head tremor, elevation L shoulder  Indication/Diagnosis: cervical dystonia  Type of toxin:Xeomin  Lot [id number]  Expiration date:02/2015  Injection sites: L SCM 25 R SCM 15 L Splenius cap 25 L Semispinalis 10 R Semispinalis 10 L levator scap 20 L trapezius 20 Bilat "crows feet" 2.5 x 2 (R and L)  Total 130   History   Social History  . Marital Status: Widowed    Spouse Name: Ronalee Belts    Number of Children: 2  . Years of Education: 14   Occupational History  . RETIRED     Social History Main Topics  . Smoking status: Never Smoker   . Smokeless tobacco: Never Used  . Alcohol Use: Yes     Comment: Occasional,one glass of wine weekly  . Drug Use: No  .  Sexual Activity: Not Currently    Partners: Male   Other Topics Concern  . 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 CVA Mother   . Diabetes Father   . Parkinson's disease Paternal Uncle   . Cataracts Sister     Past Medical History  Diagnosis Date  . Hypertension   . Hyperlipidemia   . GERD (gastroesophageal reflux disease)   . Trigger finger   . Complication of anesthesia     slow to awaken  . Cervical dystonia   . Hypopotassemia   . 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 Osteoarthrosis involving, or with mention of more than one site, but not specified as generalized, site unspecified   . Osteoarthrosis involving, or with mention of more than one site, but not specified as generalized, site unspecified   . Essential and other specified forms of tremor   .  Esophageal reflux   . Endometriosis     Past Surgical History  Procedure Laterality Date  . Joint replacement      bilateral  . Cholecystectomy    . Abdominal hysterectomy    . Appendectomy    . Colon surgery      bowel resection  . Tonsillectomy    . Foot surgery    . Trigger finger release  02/01/2012    Procedure: RELEASE TRIGGER FINGER/A-1 PULLEY;  Surgeon: Jolyn Nap, MD;  Location: [facility];  Service: Orthopedics;  Laterality: Right;  RIGHT LONG FINGER  TRIGGER FINGER RELEASE    Current Outpatient Prescriptions  Medication Sig Dispense Refill  . aspirin 81 MG tablet Take 81 mg by mouth daily.      Marland Kitchen atorvastatin  (LIPITOR) 10 MG tablet Take 1 tablet (10 mg total) by mouth daily.  90 tablet  3  . clonazePAM (KLONOPIN) 0.5 MG tablet Take 1 tablet (0.5 mg total) by mouth 2 (two) times daily as needed.  60 tablet  5  . ibuprofen (ADVIL,MOTRIN) 800 MG tablet Take 1 tablet (800 mg total) by mouth 3 (three) times daily.  180 tablet  1  . multivitamin-iron-minerals-folic acid (CENTRUM) chewable tablet Chew 1 tablet by mouth daily.      Marland Kitchen omega-3 acid ethyl esters (LOVAZA) 1 G capsule Take 2 capsules (2 g total) by mouth 2 (two) times daily.  120 capsule  5  . omeprazole (PRILOSEC) 20 MG capsule Take 1 capsule (20 mg total) by mouth daily.  90 capsule  3  . potassium chloride (K-DUR,KLOR-CON) 20 MEQ tablet Take 1 tablet (20 mEq total) by mouth daily.  90 tablet  11  . predniSONE (DELTASONE) 5 MG tablet Take 5 mg by mouth daily. One tablet every three days      . propranolol ER (INDERAL LA) 60 MG 24 hr capsule Take 60 mg by mouth daily.      . risedronate (ACTONEL) 150 MG tablet Take 150 mg by mouth every 30 (thirty) days. with water on empty stomach, nothing by mouth or lie down for next 30 minutes.      . traMADol-acetaminophen (ULTRACET) 37.5-325 MG per tablet Take 1-2 tab daily prn increased pain  90 tablet  1  . XEOMIN 50 UNITS SOLR injection        No current facility-administered medications for this visit.    Allergies as of 03/24/2013 - Review Complete 03/24/2013  Allergen Reaction Noted  . Zocor [simvastatin]  08/01/2012    Vitals: BP 134/82  Pulse 80  Ht 5\' 6"  (1.676 m)  Wt 204 lb (92.534 kg)  BMI 32.94 kg/m2 Last Weight:  Wt Readings from Last 1 Encounters:  03/24/13 204 lb (92.534 kg)   Last Height:   Ht Readings from Last 1 Encounters:  03/24/13 5\' 6"  (1.676 m)      Assessment:  After physical and neurologic examination, review of laboratory studies, imaging, neurophysiology testing and pre-existing records, assessment will be reviewed on the problem list.  Plan:  Treatment plan  and additional workup will be reviewed under Problem List.  Alyssa Macdonald is a 68 y.o. female here for botulinum toxin injections. They tolerated procedure well with no complications.  1) Botox injections as detailed above. A total of 130 units was used. Will plan to order 20 units for next visit 2) Tylenol or Motrin for injection site pain. 3) Medication guide dispensed. 4) Follow up for repeat injections in 3 months.  5) Instructed to drink through a straw for 72 hours

## 2013-04-18 ENCOUNTER — Encounter: Payer: Self-pay | Admitting: Family Medicine

## 2013-04-18 ENCOUNTER — Ambulatory Visit (INDEPENDENT_AMBULATORY_CARE_PROVIDER_SITE_OTHER): Payer: Medicare Other | Admitting: Family Medicine

## 2013-04-18 VITALS — BP 118/79 | HR 71 | Resp 16 | Wt 202.0 lb

## 2013-04-18 DIAGNOSIS — R8290 Unspecified abnormal findings in urine: Secondary | ICD-10-CM

## 2013-04-18 DIAGNOSIS — N760 Acute vaginitis: Secondary | ICD-10-CM

## 2013-04-18 DIAGNOSIS — R82998 Other abnormal findings in urine: Secondary | ICD-10-CM

## 2013-04-18 LAB — POCT URINALYSIS DIPSTICK
Bilirubin, UA: NEGATIVE
Blood, UA: NEGATIVE
Glucose, UA: NEGATIVE
Ketones, UA: NEGATIVE
Nitrite, UA: NEGATIVE
Protein, UA: NEGATIVE
Spec Grav, UA: 1.02
Urobilinogen, UA: NEGATIVE
pH, UA: 5

## 2013-04-18 MED ORDER — TERCONAZOLE 0.8 % VA CREA: 1.0000 | TOPICAL_CREAM | Freq: Every day | VAGINAL | Status: DC

## 2013-04-18 MED ORDER — FLUCONAZOLE 150 MG PO TABS: 150.0000 mg | ORAL_TABLET | Freq: Once | ORAL | Status: DC

## 2013-04-18 MED ORDER — METRONIDAZOLE 0.75 % VA GEL: 1.0000 | Freq: Two times a day (BID) | VAGINAL | Status: DC

## 2013-04-18 MED ORDER — SULFAMETHOXAZOLE-TRIMETHOPRIM 800-160 MG PO TABS: 1.0000 | ORAL_TABLET | Freq: Two times a day (BID) | ORAL | Status: AC

## 2013-04-18 NOTE — Progress Notes (Signed)
Subjective:    Patient ID: Alyssa Macdonald, female    DOB: [birth year], 68 y.o.   MRN: [id number]  Alyssa Macdonald is here today because she feels that she may have another bladder infection.   Urinary Tract Infection  This is a new problem. The current episode started 1 to 4 weeks ago. The problem occurs every urination. The problem has been waxing and waning. There has been no fever. She is not sexually active. Associated symptoms include frequency. She has tried nothing for the symptoms.     Review of Systems  Gastrointestinal: Positive for abdominal pain.       Left side   Genitourinary: Positive for frequency.       Strong urine odor    Past Medical History  Diagnosis Date  . Hypertension   . Hyperlipidemia   . GERD (gastroesophageal reflux disease)   . Trigger finger   . Complication of anesthesia     slow to awaken  . Cervical dystonia   . Hypopotassemia   . 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 Osteoarthrosis involving, or with mention of more than one site, but not specified as generalized, site unspecified   . Osteoarthrosis involving, or with mention of more than one site, but not specified as generalized, site unspecified   . Essential and other specified forms of tremor   . Esophageal reflux   . Endometriosis      Past Surgical History  Procedure Laterality Date  . Joint replacement      bilateral  . Cholecystectomy    . Abdominal hysterectomy    . Appendectomy    . Colon surgery      bowel resection  . Tonsillectomy    . Foot surgery    . 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 CVA Mother   . Diabetes Father   . Parkinson's disease Paternal Uncle   . Cataracts Sister      Current Outpatient Prescriptions on File Prior to Visit  Medication Sig Dispense Refill  . aspirin 81 MG tablet Take 81 mg by mouth daily.      Marland Kitchen atorvastatin (LIPITOR) 10 MG tablet Take 1 tablet (10 mg total) by mouth daily.  90 tablet  3  . clonazePAM (KLONOPIN) 0.5 MG tablet Take 1 tablet (0.5 mg total) by mouth 2 (two) times daily as needed.  60 tablet  5  . ibuprofen (ADVIL,MOTRIN) 800 MG tablet Take 1 tablet (800 mg total) by mouth 3 (three) times daily.  180 tablet  1  . multivitamin-iron-minerals-folic acid (CENTRUM)  chewable tablet Chew 1 tablet by mouth daily.      Marland Kitchen omega-3 acid ethyl esters (LOVAZA) 1 G capsule Take 2 capsules (2 g total) by mouth 2 (two) times daily.  120 capsule  5  . omeprazole (PRILOSEC) 20 MG capsule Take 1 capsule (20 mg total) by mouth daily.  90 capsule  3  . potassium chloride (K-DUR,KLOR-CON) 20 MEQ tablet Take 1 tablet (20 mEq total) by mouth daily.  90 tablet  11  . propranolol ER (INDERAL LA) 60 MG 24 hr capsule Take 60 mg by mouth daily.      . traMADol-acetaminophen (ULTRACET) 37.5-325 MG per tablet Take 1-2 tab daily prn increased pain  90 tablet  1  . XEOMIN 50 UNITS SOLR injection        No current facility-administered medications on file prior to visit.     Allergies  Allergen Reactions  . Zocor [Simvastatin]     Myalgia     Immunization History  Administered Date(s) Administered  . Influenza,inj,Quad PF,36+ Mos 10/11/2012  . Pneumococcal-Unspecified 11/27/2003  . Td 08/29/2001, 05/16/2007  . Zoster  04/29/2006       Objective:   Physical Exam  Constitutional: She appears well-nourished. No distress.  Cardiovascular: Normal rate, regular rhythm and normal heart sounds.   Pulmonary/Chest: Effort normal and breath sounds normal.  Abdominal: She exhibits no mass. There is tenderness. There is no rebound, no guarding and no CVA tenderness.  Neurological: She is alert.  Skin: Skin is warm and dry. No rash noted.  Psychiatric: She has a normal mood and affect.      Assessment & Plan:   Cherril was seen today for back/side pain and foul smelling urine.  Diagnoses and associated orders for this visit:  Abnormal urine odor - POCT urinalysis dipstick - Urine Culture - sulfamethoxazole-trimethoprim (SEPTRA DS) 800-160 MG per tablet; Take 1 tablet by mouth 2 (two) times daily.  Vaginitis and vulvovaginitis, unspecified - Discontinue: metroNIDAZOLE (METROGEL VAGINAL) 0.75 % vaginal gel; Place 1 Applicatorful vaginally 2 (two) times daily. - Discontinue: fluconazole (DIFLUCAN) 150 MG tablet; Take 1 tablet (150 mg total) by mouth once. - Discontinue: terconazole (TERAZOL 3) 0.8 % vaginal cream; Place 1 applicator vaginally at bedtime.

## 2013-04-19 LAB — URINE CULTURE
Colony Count: NO GROWTH
Organism ID, Bacteria: NO GROWTH

## 2013-04-20 ENCOUNTER — Telehealth: Payer: Self-pay | Admitting: *Deleted

## 2013-04-20 NOTE — Telephone Encounter (Signed)
Alyssa Macdonald is aware that her urine culture was negative-eh

## 2013-04-28 ENCOUNTER — Encounter: Payer: Self-pay | Admitting: Family Medicine

## 2013-04-28 ENCOUNTER — Ambulatory Visit (INDEPENDENT_AMBULATORY_CARE_PROVIDER_SITE_OTHER): Payer: Medicare Other | Admitting: Family Medicine

## 2013-04-28 VITALS — BP 117/73 | HR 87 | Resp 16 | Wt 201.0 lb

## 2013-04-28 DIAGNOSIS — B029 Zoster without complications: Secondary | ICD-10-CM

## 2013-04-28 MED ORDER — PREGABALIN 50 MG PO CAPS: 50.0000 mg | ORAL_CAPSULE | Freq: Three times a day (TID) | ORAL | Status: DC

## 2013-04-28 MED ORDER — VALACYCLOVIR HCL 1 G PO TABS: 1000.0000 mg | ORAL_TABLET | Freq: Two times a day (BID) | ORAL | Status: DC

## 2013-04-28 NOTE — Progress Notes (Signed)
Subjective:    Patient ID: Alyssa Macdonald, female    DOB: 1945-04-26, 68 y.o.   MRN: [id number]  HPI  Alyssa Macdonald is here today to discuss a facial pain that she has had for the past three days.  She says that she has pain and tingling on her forehead above her eyebrow the pain has started to move to the left side of her face.  She also has a headache and she is wondering if she is developing shingles.     Review of Systems  Skin: Negative for rash.       Tingling sensation and pain in the left side of her face.  Neurological: Positive for headaches.     Past Medical History  Diagnosis Date  . Hypertension   . Hyperlipidemia   . GERD (gastroesophageal reflux disease)   . Trigger finger   . Complication of anesthesia     slow to awaken  . Cervical dystonia   . Hypopotassemia   . 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 Osteoarthrosis involving, or with mention of more than one site, but not specified as generalized, site unspecified   . Osteoarthrosis involving, or with mention of more than one site, but not specified as generalized, site unspecified   . Essential and other specified forms of tremor   . Esophageal reflux   . Endometriosis      Past Surgical History  Procedure Laterality Date  . Joint replacement      bilateral  . Cholecystectomy    . Abdominal hysterectomy    . Appendectomy    . Colon surgery      bowel resection  . Tonsillectomy    . Foot surgery    . Trigger finger release  02/01/2012    Procedure: RELEASE TRIGGER FINGER/A-1 PULLEY;  Surgeon: Jolyn Nap, MD;  Location: [facility];  Service: Orthopedics;  Laterality: Right;  RIGHT LONG FINGER  TRIGGER FINGER RELEASE     History   Social History Narrative   Marital Status:Widowed Alyssa Macdonald)    Children:  Son Alyssa Macdonald) Daughter Alyssa Macdonald)    Pets: Dog Alyssa Macdonald)   Living Situation: Lives alone   Occupation: Retired     Education: Geophysicist/field seismologist in [place]  Use/Exposure:  None    Alcohol Use:  Occasional 1-2 per week   Drug Use:  None   Diet:  Regular   Exercise:  None   Hobbies: Reading, Quilting   Patient is right handed                       Family History  Problem Relation Age of Onset  . CVA Mother   . Diabetes Father   . Parkinson's disease Paternal Uncle   . Cataracts Sister      Current Outpatient Prescriptions on File Prior to Visit  Medication Sig Dispense Refill  . aspirin 81 MG tablet Take 81 mg by mouth daily.      Marland Kitchen atorvastatin (LIPITOR) 10 MG tablet Take 1 tablet (10 mg total) by mouth daily.  90 tablet  3  . clonazePAM (KLONOPIN) 0.5 MG tablet Take 1 tablet (0.5 mg total) by mouth 2 (two) times daily as needed.  60 tablet  5  . ibuprofen (ADVIL,MOTRIN) 800 MG tablet Take 1 tablet (800 mg total) by mouth 3 (three) times daily.  180 tablet  1  . multivitamin-iron-minerals-folic acid (CENTRUM) chewable tablet Chew 1 tablet  by mouth daily.      Marland Kitchen omega-3 acid ethyl esters (LOVAZA) 1 G capsule Take 2 capsules (2 g total) by mouth 2 (two) times daily.  120 capsule  5  . omeprazole (PRILOSEC) 20 MG capsule Take 1 capsule (20 mg total) by mouth daily.  90 capsule  3  . potassium chloride (K-DUR,KLOR-CON) 20 MEQ tablet Take 1 tablet (20 mEq total) by mouth daily.  90 tablet  11  . propranolol ER (INDERAL LA) 60 MG 24 hr capsule Take 60 mg by mouth daily.      . traMADol-acetaminophen (ULTRACET) 37.5-325 MG per tablet Take 1-2 tab daily prn increased pain  90 tablet  1  . XEOMIN 50 UNITS SOLR injection        No current facility-administered medications on file prior to visit.     Allergies  Allergen Reactions  . Zocor [Simvastatin]     Myalgia     Immunization History  Administered Date(s) Administered  . Influenza,inj,Quad PF,36+ Mos 10/11/2012  . Pneumococcal-Unspecified 11/27/2003  . Td 08/29/2001, 05/16/2007  . Zoster 04/29/2006       Objective:   Physical Exam  Musculoskeletal: She exhibits  tenderness (Forehead/Left Side of Face).  Skin: No rash noted.      Assessment & Plan:    Chaunda was seen today for facial pain.  Diagnoses and associated orders for this visit:  Shingles - valACYclovir (VALTREX) 1000 MG tablet; Take 1 tablet (1,000 mg total) by mouth 2 (two) times daily. - pregabalin (LYRICA) 50 MG capsule; Take 1 capsule (50 mg total) by mouth 3 (three) times daily.

## 2013-05-24 ENCOUNTER — Other Ambulatory Visit: Payer: Self-pay | Admitting: *Deleted

## 2013-05-24 MED ORDER — RISEDRONATE SODIUM 150 MG PO TABS: 150.0000 mg | ORAL_TABLET | ORAL | Status: DC

## 2013-06-16 ENCOUNTER — Telehealth: Payer: Self-pay | Admitting: *Deleted

## 2013-06-16 NOTE — Telephone Encounter (Signed)
Called patient to r/s appointment on 05/19 at 2:30, Dr. Janann Colonel is not available due to stroke rounds.

## 2013-06-16 NOTE — Telephone Encounter (Signed)
Left message on cell phone to call back and r/s her appointment from 06/20/13.

## 2013-06-18 DIAGNOSIS — R8290 Unspecified abnormal findings in urine: Secondary | ICD-10-CM | POA: Insufficient documentation

## 2013-06-18 DIAGNOSIS — N760 Acute vaginitis: Secondary | ICD-10-CM | POA: Insufficient documentation

## 2013-06-20 ENCOUNTER — Ambulatory Visit: Payer: Medicare Other | Admitting: Neurology

## 2013-06-29 ENCOUNTER — Ambulatory Visit: Payer: Medicare Other | Admitting: Neurology

## 2013-06-29 ENCOUNTER — Telehealth: Payer: Self-pay | Admitting: Neurology

## 2013-06-29 NOTE — Telephone Encounter (Signed)
I have called [facility] and straightened it out and am waiting on the patient to call me back to reschedule

## 2013-06-29 NOTE — Telephone Encounter (Signed)
Hi Janisha,  Can you please look into what happened with this. Thanks.

## 2013-06-29 NOTE — Telephone Encounter (Signed)
I have spoken to the patient and she will be in on 6/2 at 10:30.

## 2013-06-29 NOTE — Telephone Encounter (Signed)
Patient called to state that she went to [employer] to get her Botox and they did not have it. She called to state that there was no sense on coming to her appointment if she did not have the medications, so she cancelled.

## 2013-07-04 ENCOUNTER — Encounter: Payer: Self-pay | Admitting: Neurology

## 2013-07-04 ENCOUNTER — Ambulatory Visit (INDEPENDENT_AMBULATORY_CARE_PROVIDER_SITE_OTHER): Payer: Medicare Other | Admitting: Neurology

## 2013-07-04 VITALS — BP 101/60 | HR 58 | Ht 66.0 in | Wt 206.0 lb

## 2013-07-04 DIAGNOSIS — G243 Spasmodic torticollis: Secondary | ICD-10-CM

## 2013-07-04 MED ORDER — INCOBOTULINUMTOXINA 100 UNITS IM SOLR: 150.0000 [IU] | Freq: Once | INTRAMUSCULAR | Status: DC

## 2013-07-04 NOTE — Progress Notes (Signed)
[facility]   Provider:  Dr Janann Colonel Referring Provider: Jonathon Resides, MD Primary Care Physician:  Ival Bible, MD  CC: cervical dystonia  HPI:  Alyssa Macdonald is a 68 y.o. female here for follow up Xeomin injections for spasmodic torticollis. Last injections were done on 03/24/2013 at which time she noted good benefit of her symptoms. No adverse effects. Benefit lasted up until around 2-3 weeks ago. She continues to have tremor and pain discomfort, predominantly in bilateral trapezius L>R.   Reports hand tremor well controlled with propranolol.   Contraindications and precautions discussed with patient. EMG guidance was used to inject muscles detailed below. Aseptic procedure was observed and patient tolerated procedure. Procedure performed by Dr. Jim Like.  The condition has existed for more than 6 months, and pt does not have a diagnosis of ALS, Myasthenia Gravis or Lambert-Eaton Syndrome.  Risks and benefits of injections discussed and pt agrees to proceed with the procedure.  Written consent obtained These injections are medically necessary. He receives good benefits from these injections. These injections do not cause sedations or hallucinations which the oral therapies may cause.  Physical Exam: R torticolis, elevation L shoulder, "no-no" dystonic head tremor, elevation L shoulder  Indication/Diagnosis: cervical dystonia  Type of toxin:Xeomin  Lot #[id number]  Expiration date:08/2015 for both  Injection sites: L SCM 25 R SCM 15 L Splenius cap 25 L Semispinalis 10 R Semispinalis 10 L levator scap 20 L trapezius 20 Bilat "crows feet" 2.5 x 2 (R and L)  Total 130   History   Social History  . Marital Status: Widowed    Spouse Name: Alyssa Macdonald    Number of Children: 2  . Years of Education: 14   Occupational History  . RETIRED     Social History Main Topics  . Smoking status: Never Smoker   . Smokeless tobacco: Never Used  . Alcohol  Use: Yes     Comment: Occasional,one glass of wine weekly  . Drug Use: No  . Sexual Activity: Not Currently    Partners: Male   Other Topics Concern  . 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 CVA Mother   . Diabetes Father   . Parkinson's disease Paternal Uncle   . Cataracts Sister     Past Medical History  Diagnosis Date  . Hypertension   . Hyperlipidemia   . GERD (gastroesophageal reflux disease)   . Trigger finger   . Complication of anesthesia     slow to awaken  . Cervical dystonia   . Hypopotassemia   . 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 Osteoarthrosis involving, or with mention of more than one site, but not specified as generalized, site unspecified   . Osteoarthrosis involving, or with mention of more than one site, but not specified  as generalized, site unspecified   . Essential and other specified forms of tremor   . Esophageal reflux   . Endometriosis     Past Surgical History  Procedure Laterality Date  . Joint replacement      bilateral  . Cholecystectomy    . Abdominal hysterectomy    . Appendectomy    . Colon surgery      bowel resection  . Tonsillectomy    . Foot surgery    . Trigger finger release  02/01/2012    Procedure: RELEASE TRIGGER FINGER/A-1 PULLEY;  Surgeon: Jolyn Nap, MD;  Location: [facility];  Service: Orthopedics;  Laterality: Right;  RIGHT LONG FINGER  TRIGGER FINGER RELEASE    Current Outpatient Prescriptions  Medication Sig Dispense  Refill  . aspirin 81 MG tablet Take 81 mg by mouth daily.      Marland Kitchen atorvastatin (LIPITOR) 10 MG tablet Take 1 tablet (10 mg total) by mouth daily.  90 tablet  3  . clonazePAM (KLONOPIN) 0.5 MG tablet Take 1 tablet (0.5 mg total) by mouth 2 (two) times daily as needed.  60 tablet  5  . ibuprofen (ADVIL,MOTRIN) 800 MG tablet Take 1 tablet (800 mg total) by mouth 3 (three) times daily.  180 tablet  1  . multivitamin-iron-minerals-folic acid (CENTRUM) chewable tablet Chew 1 tablet by mouth daily.      Marland Kitchen omega-3 acid ethyl esters (LOVAZA) 1 G capsule Take 2 capsules (2 g total) by mouth 2 (two) times daily.  120 capsule  5  . omeprazole (PRILOSEC) 20 MG capsule Take 1 capsule (20 mg total) by mouth daily.  90 capsule  3  . potassium chloride (K-DUR,KLOR-CON) 20 MEQ tablet Take 1 tablet (20 mEq total) by mouth daily.  90 tablet  11  . propranolol ER (INDERAL LA) 60 MG 24 hr capsule Take 60 mg by mouth daily.      . risedronate (ACTONEL) 150 MG tablet Take 1 tablet (150 mg total) by mouth every 30 (thirty) days. with water on empty stomach, nothing by mouth or lie down for next 30 minutes.  1 tablet  5  . traMADol-acetaminophen (ULTRACET) 37.5-325 MG per tablet Take 1-2 tab daily prn increased pain  90 tablet  1  . valACYclovir (VALTREX) 1000 MG tablet Take 1 tablet (1,000 mg total) by mouth 2 (two) times daily.  21 tablet  0  . XEOMIN 50 UNITS SOLR injection       . pregabalin (LYRICA) 50 MG capsule Take 1 capsule (50 mg total) by mouth 3 (three) times daily.  90 capsule  3   No current facility-administered medications for this visit.    Allergies as of 07/04/2013 - Review Complete 07/04/2013  Allergen Reaction Noted  . Zocor [simvastatin]  08/01/2012    Vitals: BP 101/60  Pulse 58  Ht 5\' 6"  (1.676 m)  Wt 206 lb (93.441 kg)  BMI 33.27 kg/m2 Last Weight:  Wt Readings from Last 1 Encounters:  07/04/13 206 lb (93.441 kg)   Last Height:   Ht Readings from Last 1 Encounters:  07/04/13 5\' 6"   (1.676 m)      Assessment:  After physical and neurologic examination, review of laboratory studies, imaging, neurophysiology testing and pre-existing records, assessment will be reviewed on the problem list.  Plan:  Treatment plan and additional workup will be reviewed under Problem List.  Alyssa Macdonald is a 67 y.o. female here for botulinum toxin injections. They tolerated procedure well  with no complications.  1) Botox injections as detailed above. A total of 130 units was used. Will plan to order 150 units for next visit 2) Tylenol or Motrin for injection site pain. 3) Medication guide dispensed. 4) Follow up for repeat injections in 3 months.  5) Instructed to drink through a straw for 72 hours

## 2013-08-03 ENCOUNTER — Other Ambulatory Visit: Payer: Self-pay | Admitting: Family Medicine

## 2013-09-04 ENCOUNTER — Other Ambulatory Visit: Payer: Medicare Other

## 2013-09-11 ENCOUNTER — Ambulatory Visit: Payer: Medicare Other | Admitting: Family Medicine

## 2013-10-04 ENCOUNTER — Telehealth: Payer: Self-pay | Admitting: *Deleted

## 2013-10-04 NOTE — Telephone Encounter (Signed)
Called patient left a message that we needed to cancel and r/s her appt fot tomorrow due to the office not having any Xeomin in stock. The patient can be scheduled anytime next week to have this done.

## 2013-10-05 ENCOUNTER — Ambulatory Visit: Payer: Medicare Other | Admitting: Neurology

## 2013-10-11 ENCOUNTER — Encounter: Payer: Self-pay | Admitting: Neurology

## 2013-10-11 ENCOUNTER — Ambulatory Visit (INDEPENDENT_AMBULATORY_CARE_PROVIDER_SITE_OTHER): Payer: Medicare Other | Admitting: Neurology

## 2013-10-11 VITALS — BP 109/64 | HR 92 | Ht 66.5 in | Wt 201.0 lb

## 2013-10-11 DIAGNOSIS — G243 Spasmodic torticollis: Secondary | ICD-10-CM

## 2013-10-11 MED ORDER — INCOBOTULINUMTOXINA 100 UNITS IM SOLR: 150.0000 [IU] | Freq: Once | INTRAMUSCULAR | Status: DC

## 2013-10-11 NOTE — Progress Notes (Signed)
[facility]   Provider:  Dr Janann Colonel Referring Provider: Jonathon Resides, MD Primary Care Physician:  No primary provider on file.  CC: cervical dystonia  HPI:  LAILEE HOELZEL is a 68 y.o. female here for follow up Xeomin injections for spasmodic torticollis. Last injections were done on 07/04/2013 at which time she noted good benefit of her symptoms. No adverse effects. Benefit lasted up until around 2-3 weeks ago. She continues to have tremor and pain discomfort, predominantly in bilateral trapezius L>R.   Reports hand tremor well controlled with propranolol.   Contraindications and precautions discussed with patient. EMG guidance was used to inject muscles detailed below. Aseptic procedure was observed and patient tolerated procedure. Procedure performed by Dr. Jim Like.  The condition has existed for more than 6 months, and pt does not have a diagnosis of ALS, Myasthenia Gravis or Lambert-Eaton Syndrome.  Risks and benefits of injections discussed and pt agrees to proceed with the procedure.  Written consent obtained These injections are medically necessary. He receives good benefits from these injections. These injections do not cause sedations or hallucinations which the oral therapies may cause.  Physical Exam: R torticolis, elevation L shoulder, "no-no" dystonic head tremor, elevation L shoulder  Indication/Diagnosis: cervical dystonia  Type of toxin:Xeomin  Lot #[id number]  Expiration date:09/2015 and 11/2015  Injection sites: L SCM 25 R SCM 15 L Splenius cap 25 L Semispinalis 10 R Semispinalis 10 L levator scap 25 L trapezius 20 Bilat "crows feet" 2.5 x 2 (R and L)  Total 135   History   Social History  . Marital Status: Widowed    Spouse Name: Ronalee Belts    Number of Children: 2  . Years of Education: 14   Occupational History  . RETIRED     Social History Main Topics  . Smoking status: Never Smoker   . Smokeless tobacco: Never Used   . Alcohol Use: Yes     Comment: Occasional,one glass of wine weekly  . Drug Use: No  . Sexual Activity: Not Currently    Partners: Male   Other Topics Concern  . 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 CVA Mother   . Diabetes Father   . Parkinson's disease Paternal Uncle   . Cataracts Sister     Past Medical History  Diagnosis Date  . Hypertension   . Hyperlipidemia   . GERD (gastroesophageal reflux disease)   . Trigger finger   . Complication of anesthesia     slow to awaken  . Cervical dystonia   . Hypopotassemia   . 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 Osteoarthrosis involving, or with mention of more than one site, but not specified as generalized, site unspecified   . Osteoarthrosis involving, or with mention of more than one site, but not  specified as generalized, site unspecified   . Essential and other specified forms of tremor   . Esophageal reflux   . Endometriosis     Past Surgical History  Procedure Laterality Date  . Joint replacement      bilateral  . Cholecystectomy    . Abdominal hysterectomy    . Appendectomy    . Colon surgery      bowel resection  . Tonsillectomy    . Foot surgery    . Trigger finger release  02/01/2012    Procedure: RELEASE TRIGGER FINGER/A-1 PULLEY;  Surgeon: Jolyn Nap, MD;  Location: [facility];  Service: Orthopedics;  Laterality: Right;  RIGHT LONG FINGER  TRIGGER FINGER RELEASE    Current Outpatient Prescriptions  Medication Sig  Dispense Refill  . aspirin 81 MG tablet Take 81 mg by mouth daily.      Marland Kitchen atorvastatin (LIPITOR) 10 MG tablet Take 1 tablet (10 mg total) by mouth daily.  90 tablet  3  . clonazePAM (KLONOPIN) 0.5 MG tablet Take 1 tablet (0.5 mg total) by mouth 2 (two) times daily as needed.  60 tablet  5  . ibuprofen (ADVIL,MOTRIN) 800 MG tablet Take 1 tablet (800 mg total) by mouth 3 (three) times daily.  180 tablet  1  . multivitamin-iron-minerals-folic acid (CENTRUM) chewable tablet Chew 1 tablet by mouth daily.      Marland Kitchen omega-3 acid ethyl esters (LOVAZA) 1 G capsule Take 2 capsules (2 g total) by mouth 2 (two) times daily.  120 capsule  5  . omeprazole (PRILOSEC) 20 MG capsule Take 1 capsule (20 mg total) by mouth daily.  90 capsule  3  . potassium chloride (K-DUR,KLOR-CON) 20 MEQ tablet Take 1 tablet (20 mEq total) by mouth daily.  90 tablet  11  . propranolol ER (INDERAL LA) 60 MG 24 hr capsule Take 60 mg by mouth daily.      . risedronate (ACTONEL) 150 MG tablet Take 1 tablet (150 mg total) by mouth every 30 (thirty) days. with water on empty stomach, nothing by mouth or lie down for next 30 minutes.  1 tablet  5  . traMADol-acetaminophen (ULTRACET) 37.5-325 MG per tablet Take 1-2 tab daily prn increased pain  90 tablet  1  . valACYclovir (VALTREX) 1000 MG tablet Take 1 tablet (1,000 mg total) by mouth 2 (two) times daily.  21 tablet  0  . XEOMIN 50 UNITS SOLR injection       . pregabalin (LYRICA) 50 MG capsule Take 1 capsule (50 mg total) by mouth 3 (three) times daily.  90 capsule  3   Current Facility-Administered Medications  Medication Dose Route Frequency Provider Last Rate Last Dose  . incobotulinumtoxinA (XEOMIN) 100 UNITS injection 150 Units  150 Units Intramuscular Once Hulen Luster, DO        Allergies as of 10/11/2013 - Review Complete 10/11/2013  Allergen Reaction Noted  . Zocor [simvastatin]  08/01/2012    Vitals: BP 109/64  Pulse 92  Ht 5' 6.5" (1.689 m)  Wt 201 lb (91.173  kg)  BMI 31.96 kg/m2 Last Weight:  Wt Readings from Last 1 Encounters:  10/11/13 201 lb (91.173 kg)   Last Height:   Ht Readings from Last 1 Encounters:  10/11/13 5' 6.5" (1.689 m)      Assessment:  After physical and neurologic examination, review of laboratory studies, imaging, neurophysiology testing and pre-existing records, assessment will be reviewed on the problem list.  Plan:  Treatment plan and additional workup will be reviewed under Problem List.  ESPERANSA SARABIA is a 68 y.o. female here for botulinum toxin injections. They tolerated procedure well with no complications.  1) Xeomin injections as detailed above. A total of 135 units was used. Will plan to order 150 units for next visit 2) Tylenol or Motrin for injection site pain. 3) Medication guide dispensed. 4) Follow up for repeat injections in 3 months.  5) Instructed to drink through a straw for 72 hours

## 2013-10-11 NOTE — Patient Instructions (Signed)
Overall you are doing fairly well but I do want to suggest a few things today:   Remember to drink plenty of fluid, eat healthy meals and do not skip any meals. Try to eat protein with a every meal and eat a healthy snack such as fruit or nuts in between meals. Try to keep a regular sleep-wake schedule and try to exercise daily, particularly in the form of walking, 20-30 minutes a day, if you can.   We will contact you to set up repeat injections in 3 months. These will be done with Dr Rexene Alberts. Please call us with any interim questions, concerns, problems, updates or refill requests.   Please also call us for any test results so we can go over those with you on the phone.  My clinical assistant and will answer any of your questions and relay your messages to me and also relay most of my messages to you.   Our phone number is [phone]. We also have an after hours call service for urgent matters and there is a physician on-call for urgent questions. For any emergencies you know to call 911 or go to the nearest emergency room

## 2013-10-12 ENCOUNTER — Other Ambulatory Visit: Payer: Self-pay | Admitting: Family Medicine

## 2013-11-01 ENCOUNTER — Other Ambulatory Visit: Payer: Self-pay

## 2013-11-01 MED ORDER — PROPRANOLOL HCL ER 60 MG PO CP24: 60.0000 mg | ORAL_CAPSULE | Freq: Every day | ORAL | Status: DC

## 2014-01-02 ENCOUNTER — Telehealth: Payer: Self-pay | Admitting: Neurology

## 2014-01-02 NOTE — Telephone Encounter (Signed)
Called patient and rescheduled per Dr Rexene Alberts, patient is having foot surgery , confirmed with patient

## 2014-01-03 ENCOUNTER — Ambulatory Visit: Payer: Self-pay | Admitting: Neurology

## 2014-01-04 ENCOUNTER — Ambulatory Visit: Payer: Medicare Other | Admitting: Neurology

## 2014-01-10 ENCOUNTER — Ambulatory Visit: Payer: Medicare Other | Admitting: Neurology

## 2014-01-19 ENCOUNTER — Ambulatory Visit: Payer: Self-pay | Admitting: Neurology

## 2014-01-19 ENCOUNTER — Ambulatory Visit (INDEPENDENT_AMBULATORY_CARE_PROVIDER_SITE_OTHER): Payer: Medicare Other | Admitting: Neurology

## 2014-01-19 ENCOUNTER — Encounter: Payer: Self-pay | Admitting: Neurology

## 2014-01-19 VITALS — BP 118/59 | HR 57 | Temp 97.4°F | Ht 64.0 in | Wt 204.0 lb

## 2014-01-19 DIAGNOSIS — G243 Spasmodic torticollis: Secondary | ICD-10-CM

## 2014-01-19 DIAGNOSIS — G252 Other specified forms of tremor: Secondary | ICD-10-CM

## 2014-01-19 MED ORDER — INCOBOTULINUMTOXINA 50 UNITS IM SOLR
50.0000 [IU] | Freq: Once | INTRAMUSCULAR | Status: AC
  Administered 2014-01-19: 50 [IU] via INTRAMUSCULAR

## 2014-01-19 MED ORDER — INCOBOTULINUMTOXINA 100 UNITS IM SOLR
100.0000 [IU] | Freq: Once | INTRAMUSCULAR | Status: AC
  Administered 2014-01-19: 100 [IU] via INTRAMUSCULAR

## 2014-01-19 NOTE — Progress Notes (Signed)
Alyssa Macdonald is a 68 y.o. female with an underlying medical history of hypertension, hyperlipidemia, reflux disease, trigger finger, tremor, endometriosis, and osteoarthritis, who presents for repeat botulinum toxin injection for diagnosis of spasmodic torticollis or cervical dystonia. This is her first appointment with me. She previously followed with Dr. Janann Colonel and her last injection with Xeomin on 10/11/2013, at which time she received a total dose of 135 units of botulinum toxin type A in the form of Xeomin. The injection before the last was on 07/04/2013 at which time she reported good benefit to her symptoms and no adverse effects. She felt it wore off after several weeks, approximately 2-3 weeks before she was due for another injection. Today, she reports that she has done well. She had no side effects. She feels it continues to work well for her.  The condition has existed for more than 6 months, and pt does not have a diagnosis of ALS, Myasthenia Gravis or Lambert-Eaton Syndrome.    O/E: BP 118/59 mmHg  Pulse 57  Temp(Src) 97.4 F (36.3 C) (Oral)  Ht 5\' 4"  (1.626 m)  Wt 204 lb (92.534 kg)  BMI 35.00 kg/m2  She has evidence of right torticollis and left shoulder elevation. She has a mild dystonic head tremor. She also has a voice tremor.  Written informed consent has been obtained and will be scanned into the patient's electronic chart. We will re-consent if the type of botulinum toxin or the dosing or the distribution changes in the future. The patient is informed that we will use the same consent for Her recurrent, most likely 3 monthly injections. I talked to the patient in detail about expectations, limitations, benefits as well as potential adverse effects of botulinum toxin injections. The patient understands that the side effects are not limited to the ones I discussed, which include mouth dryness, dryness of eyes, speech and swallowing difficulties, respiratory depression or  problems breathing, weakness of muscles including more distant muscles than the ones injected, flu-like symptoms, myalgias, injection site reactions such as redness, itching, swelling, pain, and infection.  150 units of botulinum toxin type A were reconstituted using preservative-free normal saline to a concentration of 10 units per 0.1 mL and drawn up into 1 mL tuberculin syringes. Lot number was [id number] for the 100 unit vial and [id number] for the 50 unit vial and expiration dates were July 2017 for the 100 unit vial and February 2018 for the 50 unit vial.   The patient was situated in a chair, sitting comfortably. After preparing the areas with 70% isopropyl alcohol and using a 26 gauge 1/2 inch hollow lumen recording EMG needle, a total dose of 130 units of botulinum toxin type A in the form of Xeomin was injected into the muscles and the following distribution and quantities:  #1: 25 units in the left SCM #2: 15 units in the right SCM #3: 25 units in the left splenius capitis muscle #4: 10 units in the left semispinalis capitis muscle  #5: 10 units in the right semispinalis capitis muscle  #6: 25 units in the left levator scapulae muscle  #7: 20 units in the left upper trapezius muscle   EMG guidance was utilized for these injections with mild EMG activity noted, especially in the L splenius capitis and levator scapulae.    A dose of 20 out of a total dose of 150 units was discarded as unavoidable waste.   The patient tolerated the procedure well without immediate complications. She was  advised to make a followup appointment for repeat injections in 3 months from now and encouraged to call us with any interim questions, concerns, problems, or updates. She was in agreement and did not have any questions prior to leaving clinic today.

## 2014-01-19 NOTE — Patient Instructions (Signed)
As discussed, botulinum toxin takes about 3-7 days to kick in. Please remember, this is not a pain shot, this is to gradually improve your symptoms. In some patients it takes up to 2-3 weeks to make a difference and it wears off with time. Sometimes it may wear off before it is time for the next injection. We still should wait till the next 3 monthly injection, because injecting too frequently may cause you to develop immunity to the botulinum toxin. We are looking for a reduction in your headache frequency and headache severity. Side effects to look out for are mouth dryness, dryness of the eyes, heaviness of your head or muscle weakness, rarely, speech or swallowing difficulties and very rarely breathing difficulties. Some people have transient neck pain or soreness which typically responds to over-the-counter anti-inflammatory medication and local heat application with a heat pad. If you think you have a severe reaction to the botulinum toxin, such as weakness, trouble speaking, trouble breathing, or trouble swallowing, you have to call 911 or have someone take you to the nearest emergency room. However, most people have no side effects from the injections. It is normal to have a little bit of redness and swelling around the injection sites which usually improves after a few hours. Rarely, there may be a bruise that improves on its own. Most side effects reported are very mild and resolve within 10-14 days. Please feel free to call us if you have any additional questions or concerns: [phone]. Please give us a call in about a month to report how you are doing after the botulinum toxin injections or to just give us an update. Call sooner, if you have any concerns, questions or feel that you have potential side effects. We may have to adjust the dose over time, depending on your results from this injection and your overall response over time to this medication.   

## 2014-04-19 ENCOUNTER — Ambulatory Visit (INDEPENDENT_AMBULATORY_CARE_PROVIDER_SITE_OTHER): Payer: Medicare Other | Admitting: Neurology

## 2014-04-19 ENCOUNTER — Encounter: Payer: Self-pay | Admitting: Neurology

## 2014-04-19 VITALS — BP 142/80 | HR 88 | Temp 97.6°F | Resp 16

## 2014-04-19 DIAGNOSIS — G243 Spasmodic torticollis: Secondary | ICD-10-CM | POA: Diagnosis not present

## 2014-04-19 DIAGNOSIS — G252 Other specified forms of tremor: Secondary | ICD-10-CM

## 2014-04-19 MED ORDER — INCOBOTULINUMTOXINA 100 UNITS IM SOLR
100.0000 [IU] | Freq: Once | INTRAMUSCULAR | Status: AC
  Administered 2014-04-19: 100 [IU] via INTRAMUSCULAR

## 2014-04-19 MED ORDER — INCOBOTULINUMTOXINA 50 UNITS IM SOLR
50.0000 [IU] | Freq: Once | INTRAMUSCULAR | Status: AC
  Administered 2014-04-19: 50 [IU] via INTRAMUSCULAR

## 2014-04-19 NOTE — Patient Instructions (Signed)
As discussed, botulinum toxin takes about 3-7 days to kick in. Please remember, this is not a pain shot, this is to gradually improve your symptoms. In some patients it takes up to 2-3 weeks to make a difference and it wears off with time. Sometimes it may wear off before it is time for the next injection. We still should wait till the next 3 monthly injection, because injecting too frequently may cause you to develop immunity to the botulinum toxin. We are looking for a reduction in your headache frequency and headache severity. Side effects to look out for are mouth dryness, dryness of the eyes, heaviness of your head or muscle weakness, rarely, speech or swallowing difficulties and very rarely breathing difficulties. Some people have transient neck pain or soreness which typically responds to over-the-counter anti-inflammatory medication and local heat application with a heat pad. If you think you have a severe reaction to the botulinum toxin, such as weakness, trouble speaking, trouble breathing, or trouble swallowing, you have to call 911 or have someone take you to the nearest emergency room. However, most people have no side effects from the injections. It is normal to have a little bit of redness and swelling around the injection sites which usually improves after a few hours. Rarely, there may be a bruise that improves on its own. Most side effects reported are very mild and resolve within 10-14 days. Please feel free to call us if you have any additional questions or concerns: [phone]. Please give us a call in about a month to report how you are doing after the botulinum toxin injections or to just give us an update. Call sooner, if you have any concerns, questions or feel that you have potential side effects. We may have to adjust the dose over time, depending on your results from this injection and your overall response over time to this medication.   

## 2014-04-19 NOTE — Progress Notes (Signed)
Alyssa Macdonald is a 69 y.o. female with an underlying medical history of hypertension, hyperlipidemia, reflux disease, trigger finger, tremor, endometriosis, and osteoarthritis, who presents for repeat botulinum toxin injection for diagnosis of spasmodic torticollis or cervical dystonia. This is Alyssa Macdonald second injection with me. Alyssa Macdonald last injection was on 01/19/2014 (5th), at which time she received a total dose of 130 units of botulinum toxin type A in the form of Xeomin and we re-consented Alyssa Macdonald as this was the first injection with me. She previously followed with Dr. Janann Colonel and Alyssa Macdonald last injection with him was on 10/11/2013 (4th), at which time she received a total dose of 135 units of botulinum toxin type A in the form of Xeomin. Alyssa Macdonald 3rd injection was on 07/04/2013 at which time she reported good benefit to Alyssa Macdonald symptoms and no adverse effects. She felt it wore off after several weeks, approximately 2-3 weeks before she was due for another injection. Alyssa Macdonald first injection was on 12/22/2013 and Alyssa Macdonald second injection was on 03/24/2013.  Today, 04/19/2014: she reports that she has done well. She had no side effects. She feels it continues to work well for Alyssa Macdonald. She also feels it helps Alyssa Macdonald head tremor.  The condition has existed for more than 6 months, and pt does not have a diagnosis of ALS, Myasthenia Gravis or Lambert-Eaton Syndrome.    O/E: BP 142/80 mmHg  Pulse 88  Temp(Src) 97.6 F (36.4 C) (Oral)  Resp 16  She has evidence of right torticollis and left shoulder elevation. She has a mild dystonic head tremor. She also has a voice tremor, mild.  Written informed consent has been obtained and will be scanned into the patient's electronic chart. We will re-consent if the type of botulinum toxin or the dosing or the distribution changes in the future. The patient is informed that we will use the same consent for Alyssa Macdonald recurrent, most likely 3 monthly injections. I talked to the patient in detail about  expectations, limitations, benefits as well as potential adverse effects of botulinum toxin injections. The patient understands that the side effects are not limited to the ones I discussed, which include mouth dryness, dryness of eyes, speech and swallowing difficulties, respiratory depression or problems breathing, weakness of muscles including more distant muscles than the ones injected, flu-like symptoms, myalgias, injection site reactions such as redness, itching, swelling, pain, and infection.  150 units of botulinum toxin type A were reconstituted using preservative-free normal saline to a concentration of 10 units per 0.1 mL and drawn up into 1 mL tuberculin syringes. Lot number was [id number] for the 100 unit vial and [id number] for the 50 unit vial and expiration dates were 7/18 for the 100 unit vial and 2/18 for the 50 unit vial.   The patient was situated in a chair, sitting comfortably. After preparing the areas with 70% isopropyl alcohol and using a 26 gauge 1/2 inch hollow lumen recording EMG needle, a total dose of 130 units of botulinum toxin type A in the form of Xeomin was injected into the muscles and the following distribution and quantities:  #1: 25 units in the left SCM #2: 15 units in the right SCM #3: 25 units in the left splenius capitis muscle #4: 10 units in the left semispinalis capitis muscle   #5: 10 units in the right semispinalis capitis muscle   #6: 25 units in the left levator scapulae muscle   #7: 20 units in the left upper trapezius muscle   EMG guidance  was utilized for these injections with mild EMG activity noted, especially in the L upper trapezius muscle.    A dose of 20 out of a total dose of 150 units was discarded as unavoidable waste.    The patient tolerated the procedure well without immediate complications. She was advised to make a followup appointment for repeat injections in 3 months from now and encouraged to call us with any interim questions, concerns,  problems, or updates. She was in agreement and did not have any questions prior to leaving clinic today.

## 2014-07-24 ENCOUNTER — Ambulatory Visit (INDEPENDENT_AMBULATORY_CARE_PROVIDER_SITE_OTHER): Payer: Medicare Other | Admitting: Neurology

## 2014-07-24 ENCOUNTER — Encounter: Payer: Self-pay | Admitting: Neurology

## 2014-07-24 VITALS — BP 132/84 | HR 72 | Resp 16 | Ht 67.0 in | Wt 205.0 lb

## 2014-07-24 DIAGNOSIS — G252 Other specified forms of tremor: Secondary | ICD-10-CM

## 2014-07-24 DIAGNOSIS — G243 Spasmodic torticollis: Secondary | ICD-10-CM | POA: Diagnosis not present

## 2014-07-24 MED ORDER — INCOBOTULINUMTOXINA 50 UNITS IM SOLR
50.0000 [IU] | Freq: Once | INTRAMUSCULAR | Status: AC
  Administered 2014-07-24: 50 [IU] via INTRAMUSCULAR

## 2014-07-24 MED ORDER — INCOBOTULINUMTOXINA 100 UNITS IM SOLR
100.0000 [IU] | Freq: Once | INTRAMUSCULAR | Status: AC
  Administered 2014-07-24: 100 [IU] via INTRAMUSCULAR

## 2014-07-24 NOTE — Progress Notes (Signed)
Alyssa Macdonald is a 69 y.o. female with an underlying medical history of hypertension, hyperlipidemia, reflux disease, trigger finger, tremor, endometriosis, and osteoarthritis, who presents for repeat botulinum toxin injection for a diagnosis of spasmodic torticollis and dystonic tremor. This is her third injection with me. Her last injection (6th) was on 04/19/2014, at which time she received a total dose of 130 units of  Xeomin. Her 5th injection was on 01/19/2014, at which time she was re-consented her as this was the first injection with me. She previously followed with Dr. Janann Colonel and her last injection with him was on 10/11/2013 (4th), at which time she received a total dose of 135 units of botulinum toxin type A in the form of Xeomin. Her 3rd injection was on 07/04/2013 at which time she reported good benefit to her symptoms and no adverse effects. She felt it wore off after several weeks, approximately 2-3 weeks before she was due for another injection. Her first injection was on 12/22/2013 and her second injection was on 03/24/2013. The condition has existed for more than 6 months, and pt does not have a diagnosis of ALS, Myasthenia Gravis or Lambert-Eaton Syndrome.    Today, 07/24/2014: she reports that she has done well. She had no side effects. She feels it continues to work well for her. She has no new complaints.   O/E: .BP 132/84 mmHg  Pulse 72  Resp 16  Ht 5\' 7"  (1.702 m)  Wt 205 lb (92.987 kg)  BMI 32.10 kg/m2 She has evidence of right torticollis and left shoulder elevation. She has a mild dystonic head tremor. She also has a voice tremor, mild and a L laterocollis.  Written informed consent has previously been obtained and scanned into the patient's electronic chart. We will re-consent if the type of botulinum toxin or the dosing or the distribution changes in the future. The patient is informed that we will use the same consent for Her recurrent, most likely 3 monthly  injections. I have previously talked to the patient in detail about expectations, limitations, benefits as well as potential adverse effects of botulinum toxin injections. The patient understands that the side effects are not limited to the ones I discussed, which include mouth dryness, dryness of eyes, speech and swallowing difficulties, respiratory depression or problems breathing, weakness of muscles including more distant muscles than the ones injected, flu-like symptoms, myalgias, injection site reactions such as redness, itching, swelling, pain, and infection.  150 units of botulinum toxin type A were reconstituted using preservative-free normal saline to a concentration of 10 units per 0.1 mL and drawn up into 1 mL tuberculin syringes. Lot number was [id number] for the 100 unit vial and [id number] for the 50 unit vial and expiration dates were 7/18 for the 100 unit vial and 2/18 for the 50 unit vial.   The patient was situated in a chair, sitting comfortably. After preparing the areas with 70% isopropyl alcohol and using a 26 gauge 1/2 inch hollow lumen recording EMG needle, a total dose of 130 units of botulinum toxin type A in the form of Xeomin was injected into the muscles and the following distribution and quantities:  #1: 25 units in the left SCM #2: 15 units in the right SCM #3: 25 units in the left splenius capitis muscle #4: 10 units in the left semispinalis capitis muscle   #5: 10 units in the right semispinalis capitis muscle   #6: 25 units in the left levator scapulae muscle   #  7: 20 units in the left upper trapezius muscle   EMG guidance was utilized for these injections with mild EMG activity noted, especially in the L levator scapulae muscle.    A dose of 20 out of a total dose of 150 units was discarded as unavoidable waste.    The patient tolerated the procedure well without immediate complications. She was advised to make a followup appointment for repeat injections in 3 months  from now and encouraged to call us with any interim questions, concerns, problems, or updates. She was in agreement and did not have any questions prior to leaving clinic today.

## 2014-07-24 NOTE — Patient Instructions (Signed)
As discussed, botulinum toxin takes about 3-7 days to kick in. Please remember, this is not a pain shot, this is to gradually improve your symptoms. In some patients it takes up to 2-3 weeks to make a difference and it wears off with time. Sometimes it may wear off before it is time for the next injection. We still should wait till the next 3 monthly injection, because injecting too frequently may cause you to develop immunity to the botulinum toxin. We are looking for a reduction in your headache frequency and headache severity. Side effects to look out for are mouth dryness, dryness of the eyes, heaviness of your head or muscle weakness, rarely, speech or swallowing difficulties and very rarely breathing difficulties. Some people have transient neck pain or soreness which typically responds to over-the-counter anti-inflammatory medication and local heat application with a heat pad. If you think you have a severe reaction to the botulinum toxin, such as weakness, trouble speaking, trouble breathing, or trouble swallowing, you have to call 911 or have someone take you to the nearest emergency room. However, most people have no side effects from the injections. It is normal to have a little bit of redness and swelling around the injection sites which usually improves after a few hours. Rarely, there may be a bruise that improves on its own. Most side effects reported are very mild and resolve within 10-14 days. Please feel free to call us if you have any additional questions or concerns: [phone]. Please give us a call in about a month to report how you are doing after the botulinum toxin injections or to just give us an update. Call sooner, if you have any concerns, questions or feel that you have potential side effects. We may have to adjust the dose over time, depending on your results from this injection and your overall response over time to this medication.   

## 2014-10-24 ENCOUNTER — Ambulatory Visit (INDEPENDENT_AMBULATORY_CARE_PROVIDER_SITE_OTHER): Payer: Medicare Other | Admitting: Neurology

## 2014-10-24 ENCOUNTER — Encounter: Payer: Self-pay | Admitting: Neurology

## 2014-10-24 VITALS — BP 122/80 | HR 76 | Resp 16 | Ht 66.0 in | Wt 204.0 lb

## 2014-10-24 DIAGNOSIS — G243 Spasmodic torticollis: Secondary | ICD-10-CM

## 2014-10-24 DIAGNOSIS — G252 Other specified forms of tremor: Secondary | ICD-10-CM

## 2014-10-24 MED ORDER — INCOBOTULINUMTOXINA 50 UNITS IM SOLR
50.0000 [IU] | Freq: Once | INTRAMUSCULAR | Status: AC
  Administered 2014-10-24: 50 [IU] via INTRAMUSCULAR

## 2014-10-24 MED ORDER — INCOBOTULINUMTOXINA 100 UNITS IM SOLR
100.0000 [IU] | Freq: Once | INTRAMUSCULAR | Status: AC
  Administered 2014-10-24: 100 [IU] via INTRAMUSCULAR

## 2014-10-24 NOTE — Patient Instructions (Signed)
As discussed, botulinum toxin takes about 3-7 days to kick in. Please remember, this is not a pain shot, this is to gradually improve your symptoms. In some patients it takes up to 2-3 weeks to make a difference and it wears off with time. Sometimes it may wear off before it is time for the next injection. We still should wait till the next 3 monthly injection, because injecting too frequently may cause you to develop immunity to the botulinum toxin. We are looking for a reduction in your headache frequency and headache severity. Side effects to look out for are mouth dryness, dryness of the eyes, heaviness of your head or muscle weakness, rarely, speech or swallowing difficulties and very rarely breathing difficulties. Some people have transient neck pain or soreness which typically responds to over-the-counter anti-inflammatory medication and local heat application with a heat pad. If you think you have a severe reaction to the botulinum toxin, such as weakness, trouble speaking, trouble breathing, or trouble swallowing, you have to call 911 or have someone take you to the nearest emergency room. However, most people have no side effects from the injections. It is normal to have a little bit of redness and swelling around the injection sites which usually improves after a few hours. Rarely, there may be a bruise that improves on its own. Most side effects reported are very mild and resolve within 10-14 days. Please feel free to call us if you have any additional questions or concerns: [phone]. Please give us a call in about a month to report how you are doing after the botulinum toxin injections or to just give us an update. Call sooner, if you have any concerns, questions or feel that you have potential side effects. We may have to adjust the dose over time, depending on your results from this injection and your overall response over time to this medication.   

## 2014-10-24 NOTE — Progress Notes (Signed)
Ms. TASHARRA NODINE is a 69 y.o. female with an underlying medical history of hypertension, hyperlipidemia, reflux disease, trigger finger, tremor, endometriosis, and osteoarthritis, who presents for repeat botulinum toxin injection for a diagnosis of spasmodic torticollis and dystonic tremor. This is her fourth injection with me today. Her last injection which was her seventh injection was on 07/24/2014, at which time she received a total dose of 130 units of Xeomin. Her 6th injection was on 04/19/2014, at which time she received a total dose of 130 units of Xeomin. Her 5th injection was on 01/19/2014, at which time she was re-consented her as this was the first injection with me. She previously followed with Dr. Janann Colonel and her last injection with him was on 10/11/2013 (4th), at which time she received a total dose of 135 units of botulinum toxin type A in the form of Xeomin. Her 3rd injection was on 07/04/2013 at which time she reported good benefit to her symptoms and no adverse effects. She felt it wore off after several weeks, approximately 2-3 weeks before she was due for another injection. Her first injection was on 12/22/2013 and her second injection was on 03/24/2013. The condition has existed for more than 6 months, and pt does not have a diagnosis of ALS, Myasthenia Gravis or Lambert-Eaton Syndrome.   Today, 10/24/2014: She reports she has done well. She reports no side effects. She reports good effect from the medication. She feels it continues to do well.  O/E: BP 122/80 mmHg  Pulse 76  Resp 16  Ht 5\' 6"  (1.676 m)  Wt 204 lb (92.534 kg)  BMI 32.94 kg/m2  She has evidence of right torticollis and slight left shoulder elevation. She has a mild dystonic head tremor. She also has a voice tremor, mild and a L laterocollis.  Written informed consent has previously been obtained and scanned into the patient's electronic chart. We will re-consent if the type of botulinum toxin or the dosing or the  distribution changes in the future. The patient is informed that we will use the same consent for Her recurrent, most likely 3 monthly injections. I have previously talked to the patient in detail about expectations, limitations, benefits as well as potential adverse effects of botulinum toxin injections. The patient understands that the side effects are not limited to the ones I discussed, which include mouth dryness, dryness of eyes, speech and swallowing difficulties, respiratory depression or problems breathing, weakness of muscles including more distant muscles than the ones injected, flu-like symptoms, myalgias, injection site reactions such as redness, itching, swelling, pain, and infection.  150 units of botulinum toxin type A were reconstituted using preservative-free normal saline to a concentration of 10 units per 0.1 mL and drawn up into 1 mL tuberculin syringes. Lot number was [id number] for the 100 unit vial and [id number] for the 50 unit vial and expiration dates were 8/18 for the 100 unit vial and 2/18 for the 50 unit vial.   The patient was situated in a chair, sitting comfortably. After preparing the areas with 70% isopropyl alcohol and using a 26 gauge 1/2 inch hollow lumen recording EMG needle, a total dose of 130 units of botulinum toxin type A in the form of Xeomin was injected into the muscles and the following distribution and quantities:  #1: 25 units in the left SCM #2: 15 units in the right SCM #3: 25 units in the left splenius capitis muscle #4: 10 units in the left semispinalis capitis muscle  #5:  10 units in the right semispinalis capitis muscle  #6: 25 units in the left levator scapulae muscle  #7: 20 units in the left upper trapezius muscle   EMG guidance was utilized for these injections with mild EMG activity noted, especially in the L levator scapulae muscle and left splenius capitis muscle.   A dose of 20 out of a total dose of 150 units was discarded as unavoidable  waste.  The patient tolerated the procedure well without immediate complications. She was advised to make a followup appointment for repeat injections

## 2014-11-27 ENCOUNTER — Other Ambulatory Visit: Payer: Self-pay | Admitting: Neurology

## 2015-01-10 ENCOUNTER — Telehealth: Payer: Self-pay | Admitting: Neurology

## 2015-01-10 NOTE — Telephone Encounter (Signed)
Called patient and spoke with her . Patient will have her xeomin injection at [facility]). Patient is fine with this and will keep her follow up appointments  With Dr. Rexene Alberts.

## 2015-01-17 ENCOUNTER — Ambulatory Visit: Payer: Medicare Other | Admitting: Neurology

## 2015-02-15 ENCOUNTER — Encounter: Payer: Self-pay | Admitting: Physical Medicine & Rehabilitation

## 2015-02-15 ENCOUNTER — Encounter: Payer: Medicare Other | Attending: Physical Medicine & Rehabilitation | Admitting: Physical Medicine & Rehabilitation

## 2015-02-15 VITALS — BP 119/73 | HR 68 | Resp 14

## 2015-02-15 DIAGNOSIS — G252 Other specified forms of tremor: Secondary | ICD-10-CM | POA: Insufficient documentation

## 2015-02-15 DIAGNOSIS — G248 Other dystonia: Secondary | ICD-10-CM | POA: Insufficient documentation

## 2015-02-15 DIAGNOSIS — G243 Spasmodic torticollis: Secondary | ICD-10-CM | POA: Diagnosis not present

## 2015-02-15 NOTE — Progress Notes (Addendum)
Botox: Procedure Note Patient Name: Alyssa Macdonald DOB: 22-Nov-1945 MRN: [id number]   Procedure: Botulinum toxin administration Guidance: EMG Diagnosis: Cervical dystonia  Date: 02/15/15 Attending: Delice Lesch, MD   Trade name: Botox ([email or web address])  Informed consent: Risks, benefits & options of the procedure are explained to the patient (and/or family). The patient elects to proceed with procedure. Risks include but are not limited to weakness, respiratory distress, dry mouth, ptosis, antibody formation, worsening of some areas of function. Benefits include decreased abnormal muscle tone, improved hygiene and positioning, decreased skin breakdown and, in some cases, decreased pain. Options include conservative management with oral antispasticity agents, phenol chemodenervation of nerve or at motor nerve branches. More invasive options include intrathecal balcofen adminstration for appropriate candidates. Surgical options may include tendon lengthening or transposition or, rarely, dorsal rhizotomy.   History/Physical Examination: 70 y.o. with history of spasmodic torticollis.  She has received several injections of botulinum toxin before which has provided her benefit with tremors as well as tone.  She is being followed by [facility].  She does not increased tremor on right vs. Left side and asks about a dose increase.    Resting head tremor, voice tremor, mild left laterocollis Previous Treatments: Range of motion/Botulinum toxin injection Indication for guidance: Target active muscules  Procedure: Botulinum toxin was mixed with preservative free saline with a dilution of 2cc to 100 units. Targeted limb and muscles were identified. The skin was prepped with alcohol swabs and placement of needle tip in targeted muscle was confirmed using appropriate guidance. Prior to injection, positioning of needle tip outside of blood vessel was determined by pulling back on syringe plunger.  MUSCLE  UNITS  25 units in the left SCM 25 units in the right SCM (increased from 15U on last visit) 25 units in the left splenius capitis muscle 10 units in the left semispinalis capitis muscle  10 units in the right semispinalis capitis muscle  25 units in the left levator scapulae muscle  20 units in the left upper trapezius muscle  Total units used: 140 Total units discarded due to unavoidable waste: [id number]  Complications: None Plan: RTC 6 weeks for reevaluation  Ankit Lorie Phenix  02/15/15 11:12 AM

## 2015-03-29 ENCOUNTER — Encounter: Payer: Medicare Other | Admitting: Physical Medicine & Rehabilitation

## 2015-04-03 ENCOUNTER — Encounter: Payer: Self-pay | Admitting: Physical Medicine & Rehabilitation

## 2015-04-03 ENCOUNTER — Encounter: Payer: Medicare Other | Attending: Physical Medicine & Rehabilitation | Admitting: Physical Medicine & Rehabilitation

## 2015-04-03 VITALS — BP 130/75 | HR 68

## 2015-04-03 DIAGNOSIS — G252 Other specified forms of tremor: Secondary | ICD-10-CM | POA: Insufficient documentation

## 2015-04-03 DIAGNOSIS — G248 Other dystonia: Secondary | ICD-10-CM | POA: Diagnosis not present

## 2015-04-03 DIAGNOSIS — G243 Spasmodic torticollis: Secondary | ICD-10-CM | POA: Diagnosis not present

## 2015-04-03 DIAGNOSIS — R251 Tremor, unspecified: Secondary | ICD-10-CM

## 2015-04-03 MED ORDER — CLONAZEPAM 0.5 MG PO TABS: 0.5000 mg | ORAL_TABLET | Freq: Three times a day (TID) | ORAL | Status: DC | PRN

## 2015-04-03 NOTE — Progress Notes (Signed)
Subjective:    Patient ID: Alyssa Macdonald, female    DOB: [birth year], 70 y.o.   MRN: [id number]  HPI  70 y.o. with history of spasmodic torticollis presents for follow up after botox injection. She has received several injections of botulinum toxin before which has provided her benefit with tremors as well as tone. She is being followed by [facility]. She does note increased tremor on right vs. Left side and asks about a dose increase.  On last visit she was injected with: 25 units in the left SCM 25 units in the right SCM (increased from 15U on last visit) 25 units in the left splenius capitis muscle 10 units in the left semispinalis capitis muscle  10 units in the right semispinalis capitis muscle  25 units in the left levator scapulae muscle  20 units in the left upper trapezius muscle   She believes she has gotten benefit from the increased dose to the SCM on last visit.    Pain Inventory Average Pain 0 Pain Right Now 0 My pain is no pain  In the last 24 hours, has pain interfered with the following? General activity 0 Relation with others 0 Enjoyment of life 0 What TIME of day is your pain at its worst? no pain Sleep (in general) NA  Pain is worse with: no pain Pain improves with: no pain Relief from Meds: no pain  Mobility walk without assistance  Function not employed: date last employed .  Neuro/Psych tremor  Prior Studies Any changes since last visit?  no  Physicians involved in your care Any changes since last visit?  no   Family History  Problem Relation Age of Onset  . CVA Mother   . Diabetes Father   . Parkinson's disease Paternal Uncle   . Cataracts Sister    Social History   Social History  . Marital Status: Widowed    Spouse Name: Ronalee Belts  . Number of Children: 2  . Years of Education: 14   Occupational History  . RETIRED     Social History Main Topics  . Smoking status: Never Smoker   . Smokeless tobacco: Never Used  . Alcohol  Use: Yes     Comment: Occasional,one glass of wine weekly  . Drug Use: No  . Sexual Activity:    Partners: Male   Other Topics Concern  . None   Social History Narrative   Marital Status:Widowed Ronalee Belts)    Children:  Son Clare Gandy) Daughter [name])    Pets: Dog Terri Piedra)   Living Situation: Lives alone   Occupation: Retired     Education: Geophysicist/field seismologist in [place] Use/Exposure:  None    Alcohol Use:  Occasional 1-2 per week   Drug Use:  None   Diet:  Regular   Exercise:  None   Hobbies: Reading, Quilting   Patient is right handed                     Past Surgical History  Procedure Laterality Date  . Joint replacement      bilateral  . Cholecystectomy    . Abdominal hysterectomy    . Appendectomy    . Colon surgery      bowel resection  . Tonsillectomy    . Foot surgery    . Trigger finger release  02/01/2012    Procedure: RELEASE TRIGGER FINGER/A-1 PULLEY;  Surgeon: Jolyn Nap, MD;  Location: [facility];  Service: Orthopedics;  Laterality: Right;  RIGHT LONG FINGER  TRIGGER FINGER RELEASE  . Total knee arthroplasty      Bilateral   Past Medical History  Diagnosis Date  . Hypertension   . Hyperlipidemia   . GERD (gastroesophageal reflux disease)   . Trigger finger   . Complication of anesthesia     slow to awaken  . Cervical dystonia   . Hypopotassemia   . 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 Osteoarthrosis involving, or with mention of more than one site, but not specified as generalized, site unspecified(715.80)   . Osteoarthrosis involving, or with mention of more than one site, but not specified as generalized, site unspecified(715.80)   . Essential and other specified forms of tremor   . Esophageal reflux   . Endometriosis    BP 130/75 mmHg  Pulse 68  SpO2 96%  Opioid Risk Score:   Fall Risk Score:  `1  Depression screen PHQ 2/9  Depression screen [facility] 2/9 04/03/2015 10/11/2012  Decreased Interest 0  0  Down, Depressed, Hopeless 0 0  PHQ - 2 Score 0 0  Altered sleeping 1 -  Tired, decreased energy 0 -  Change in appetite 0 -  Feeling bad or failure about yourself  0 -  Trouble concentrating 0 -  Moving slowly or fidgety/restless 0 -  Suicidal thoughts 0 -  PHQ-9 Score 1 -   Review of Systems  Neurological:       Tremor  All other systems reviewed and are negative.     Objective:   Physical Exam  BP 130/75 mmHg  Pulse 68  SpO2 96% HENT: Normocephalic, Atraumatic Eyes: EOMI, Conj WNL Cardio: S1, S2 normal, RRR Pulm: B/l clear to auscultation.  Effort normal Abd: Soft, non-distended, non-tender, BS+ MSK: Gait WNL.   No TTP.    No edema.  ROM WNL in all planes in neck Neuro: CN II-XII grossly intact.    Head tremor  Strength  5/5 in all UE myotomes     Skin: Warm and Dry    Assessment & Plan:  70 y.o. with history of spasmodic torticollis presents for follow up after botox injection.  1. Cervical dystonia  Pt believes good benefit with increased dose on last visit.  Last regimen: 25 units in the left SCM 25 units in the right SCM (increased from 15U on last visit) 25 units in the left splenius capitis muscle 10 units in the left semispinalis capitis muscle  10 units in the right semispinalis capitis muscle  25 units in the left levator scapulae muscle  20 units in the left upper trapezius muscle   Will consider same muscles/doses vs adjustment to improve tremors  Will increase Clonazepam 0.5mg  to TID PRN  Cont Propranolol 60 ER

## 2015-05-16 ENCOUNTER — Ambulatory Visit: Payer: TRICARE For Life (TFL) | Admitting: Physical Medicine & Rehabilitation

## 2015-05-21 ENCOUNTER — Ambulatory Visit: Payer: Medicare Other | Admitting: Neurology

## 2015-05-22 ENCOUNTER — Ambulatory Visit: Payer: Medicare Other | Admitting: Neurology

## 2015-05-27 ENCOUNTER — Ambulatory Visit (INDEPENDENT_AMBULATORY_CARE_PROVIDER_SITE_OTHER): Payer: Medicare Other | Admitting: Neurology

## 2015-05-27 VITALS — BP 112/76 | HR 70 | Resp 16 | Ht 66.0 in | Wt 200.0 lb

## 2015-05-27 DIAGNOSIS — G252 Other specified forms of tremor: Secondary | ICD-10-CM

## 2015-05-27 DIAGNOSIS — G243 Spasmodic torticollis: Secondary | ICD-10-CM | POA: Diagnosis not present

## 2015-05-27 MED ORDER — INCOBOTULINUMTOXINA 50 UNITS IM SOLR
50.0000 [IU] | Freq: Once | INTRAMUSCULAR | Status: AC
  Administered 2015-05-27: 50 [IU] via INTRAMUSCULAR

## 2015-05-27 MED ORDER — INCOBOTULINUMTOXINA 100 UNITS IM SOLR
100.0000 [IU] | Freq: Once | INTRAMUSCULAR | Status: AC
  Administered 2015-05-27: 100 [IU] via INTRAMUSCULAR

## 2015-05-27 NOTE — Progress Notes (Signed)
[name] is a 70 y.o. female with an underlying medical history of hypertension, hyperlipidemia, reflux disease, trigger finger, tremor, endometriosis, and osteoarthritis, who presents for repeat botulinum toxin injection for a diagnosis of spasmodic torticollis and dystonic tremor. This is her fifth injection with me today. Her last injection with me was on 10/24/2014, at which time she received a total dose of 130 units of Xeomin. She received an injection under Dr. Posey Pronto with physical rehabilitation 02/15/2015 for total dose of 160 units. She was seen in follow-up in March 2017. I reviewed the office note and procedure note from Dr. Posey Pronto. Her overall seventh injection was on 07/24/2014, at which time she received a total dose of 130 units of Xeomin. Her 6th injection was on 04/19/2014, at which time she received a total dose of 130 units of Xeomin. Her 5th injection was on 01/19/2014, at which time she was re-consented her as this was the first injection with me. She previously followed with Dr. Janann Colonel and her last injection with him was on 10/11/2013 (4th), at which time she received a total dose of 135 units of botulinum toxin type A in the form of Xeomin. Her 3rd injection was on 07/04/2013 at which time she reported good benefit to her symptoms and no adverse effects. She felt it wore off after several weeks, approximately 2-3 weeks before she was due for another injection. Her first injection was on 12/22/2013 and her second injection was on 03/24/2013. The condition has existed for more than 6 months, and pt does not have a diagnosis of ALS, Myasthenia Gravis or Lambert-Eaton Syndrome.    Today, 05/27/2015: She reports doing fairly well. Of note, she received 10 more units with Dr. Posey Pronto to injections, 25 units on the right SCM, otherwise injections for the same. She reports that she received injections in the face but I explained to her that she did not have any documented injections in her  face. She claims that she still had injections in her face. I'm not sure as to the discrepancy but I reviewed Dr. Serita Grit injection note from January 2017 and his follow-up note from March 2017. She reports no side effects. We mutually agreed to stick with the injections we did in September 2016 for a total of 130 units.  O/E: BP 112/76 mmHg  Pulse 70  Resp 16  Ht 5\' 6"  (1.676 m)  Wt 200 lb (90.719 kg)  BMI 32.30 kg/m2 She has evidence of right torticollis and slight left shoulder elevation. She has a mild dystonic head tremor. She also has a voice tremor, mild and a very mild L laterocollis.  Written informed consent has previously been obtained and scanned into the patient's electronic chart. We will re-consent if the type of botulinum toxin or the dosing or the distribution changes in the future. The patient is informed that we will use the same consent for Her recurrent, most likely 3 monthly injections. I have previously talked to the patient in detail about expectations, limitations, benefits as well as potential adverse effects of botulinum toxin injections. The patient understands that the side effects are not limited to the ones I discussed, which include mouth dryness, dryness of eyes, speech and swallowing difficulties, respiratory depression or problems breathing, weakness of muscles including more distant muscles than the ones injected, flu-like symptoms, myalgias, injection site reactions such as redness, itching, swelling, pain, and infection.  150 units of botulinum toxin type A were reconstituted using preservative-free normal saline to a  concentration of 10 units per 0.1 mL and drawn up into 1 mL tuberculin syringes. Lot number was 1 [id number] for the 100 unit vial and  [id number] for the 50 unit vial and expiration dates were  January 2019 for the 100 unit vial and  July 2018 for the 50 unit vial.   The patient was situated in a chair, sitting comfortably. After preparing the areas with 70%  isopropyl alcohol and using a 26 gauge 1/2 inch hollow lumen recording EMG needle, a total dose of 130 units of botulinum toxin type A in the form of Xeomin was injected into the muscles and the following distribution and quantities:  #1: 25 units in the left SCM #2: 15 units in the right SCM #3: 25 units in the left splenius capitis muscle #4: 10 units in the left semispinalis capitis muscle   #5: 10 units in the right semispinalis capitis muscle   #6: 25 units in the left levator scapulae muscle   #7: 20 units in the left upper trapezius muscle   EMG guidance was utilized for these injections with mild EMG activity noted, especially in the L levator scapulae muscle and left splenius capitis muscle, b/l SCMs.    A dose of 20 out of a total dose of 150 units was discarded as unavoidable waste.    The patient tolerated the procedure well without immediate complications. She was advised to make a followup appointment for repeat injections

## 2015-05-27 NOTE — Patient Instructions (Signed)
As discussed, botulinum toxin takes about 3-7 days to kick in. Please remember, this is not a pain shot, this is to gradually improve your symptoms. In some patients it takes up to 2-3 weeks to make a difference and it wears off with time. Sometimes it may wear off before it is time for the next injection. We still should wait till the next 3 monthly injection, because injecting too frequently may cause you to develop immunity to the botulinum toxin. We are looking for a reduction in your headache frequency and headache severity. Side effects to look out for are mouth dryness, dryness of the eyes, heaviness of your head or muscle weakness, rarely, speech or swallowing difficulties and very rarely breathing difficulties. Some people have transient neck pain or soreness which typically responds to over-the-counter anti-inflammatory medication and local heat application with a heat pad. If you think you have a severe reaction to the botulinum toxin, such as weakness, trouble speaking, trouble breathing, or trouble swallowing, you have to call 911 or have someone take you to the nearest emergency room. However, most people have no side effects from the injections. It is normal to have a little bit of redness and swelling around the injection sites which usually improves after a few hours. Rarely, there may be a bruise that improves on its own. Most side effects reported are very mild and resolve within 10-14 days. Please feel free to call us if you have any additional questions or concerns: [phone]. Please give us a call in about a month to report how you are doing after the botulinum toxin injections or to just give us an update. Call sooner, if you have any concerns, questions or feel that you have potential side effects. We may have to adjust the dose over time, depending on your results from this injection and your overall response over time to this medication.   

## 2015-07-09 ENCOUNTER — Ambulatory Visit: Payer: Medicare Other | Admitting: Neurology

## 2015-08-13 ENCOUNTER — Other Ambulatory Visit: Payer: Self-pay | Admitting: Neurology

## 2015-08-29 ENCOUNTER — Ambulatory Visit (INDEPENDENT_AMBULATORY_CARE_PROVIDER_SITE_OTHER): Payer: Medicare Other | Admitting: Neurology

## 2015-08-29 ENCOUNTER — Encounter: Payer: Self-pay | Admitting: Neurology

## 2015-08-29 VITALS — BP 122/78 | HR 68 | Resp 16 | Ht 66.0 in | Wt 198.0 lb

## 2015-08-29 DIAGNOSIS — G252 Other specified forms of tremor: Secondary | ICD-10-CM

## 2015-08-29 DIAGNOSIS — G243 Spasmodic torticollis: Secondary | ICD-10-CM

## 2015-08-29 MED ORDER — INCOBOTULINUMTOXINA 50 UNITS IM SOLR
150.0000 [IU] | INTRAMUSCULAR | Status: DC
  Administered 2015-08-29: 150 [IU] via INTRAMUSCULAR

## 2015-08-29 NOTE — Patient Instructions (Signed)
As discussed, botulinum toxin takes about 3-7 days to kick in. Please remember, this is not a pain shot, this is to gradually improve your symptoms. In some patients it takes up to 2-3 weeks to make a difference and it wears off with time. Sometimes it may wear off before it is time for the next injection. We still should wait till the next 3 monthly injection, because injecting too frequently may cause you to develop immunity to the botulinum toxin. We are looking for a reduction in your headache frequency and headache severity. Side effects to look out for are mouth dryness, dryness of the eyes, heaviness of your head or muscle weakness, rarely, speech or swallowing difficulties and very rarely breathing difficulties. Some people have transient neck pain or soreness which typically responds to over-the-counter anti-inflammatory medication and local heat application with a heat pad. If you think you have a severe reaction to the botulinum toxin, such as weakness, trouble speaking, trouble breathing, or trouble swallowing, you have to call 911 or have someone take you to the nearest emergency room. However, most people have no side effects from the injections. It is normal to have a little bit of redness and swelling around the injection sites which usually improves after a few hours. Rarely, there may be a bruise that improves on its own. Most side effects reported are very mild and resolve within 10-14 days. Please feel free to call us if you have any additional questions or concerns: [phone]. Please give us a call in about a month to report how you are doing after the botulinum toxin injections or to just give us an update. Call sooner, if you have any concerns, questions or feel that you have potential side effects. We may have to adjust the dose over time, depending on your results from this injection and your overall response over time to this medication.   

## 2015-08-29 NOTE — Progress Notes (Signed)
Alyssa Macdonald is a 70 y.o. female with an underlying medical history of hypertension, hyperlipidemia, reflux disease, trigger finger, tremor, endometriosis, and osteoarthritis, who presents for repeat botulinum toxin injection for a diagnosis of spasmodic torticollis and dystonic tremor. This is her 6th injection with me. She had her last injection on 05/27/2015, at which time she reported doing fairly well. She had received her previous injection under Dr. Posey Pronto. We reviewe I reviewed her note at the time. She had an injection in January 2017 and follow-up with him in March 2017. She reported no side effects. She was agreeable with pursuing her previous injections that I did.   Her fifth injection with me was on 10/24/2014, at which time she received a total dose of 130 units of Xeomin. She received an injection under Dr. Posey Pronto with physical rehabilitation 02/15/2015 for total dose of 160 units. She was seen in follow-up in March 2017. I reviewed the office note and procedure note from Dr. Posey Pronto. Her overall seventh injection was on 07/24/2014, at which time she received a total dose of 130 units of Xeomin. Her 6th injection was on 04/19/2014, at which time she received a total dose of 130 units of Xeomin. Her 5th injection was on 01/19/2014, at which time she was re-consented her as this was the first injection with me. She previously followed with Dr. Janann Colonel and her last injection with him was on 10/11/2013 (4th), at which time she received a total dose of 135 units of botulinum toxin type A in the form of Xeomin. Her 3rd injection was on 07/04/2013 at which time she reported good benefit to her symptoms and no adverse effects. She felt it wore off after several weeks, approximately 2-3 weeks before she was due for another injection. Her first injection was on 12/22/2013 and her second injection was on 03/24/2013. The condition has existed for more than 6 months, and pt does not have a diagnosis of ALS,  Myasthenia Gravis or Lambert-Eaton Syndrome.     Today, 08/29/2015: She reports doing well, medication worked well, good results, no side effects reported.  O/E: BP 122/78   Pulse 68   Resp 16   Ht 5\' 6"  (1.676 m)   Wt 198 lb (89.8 kg)   BMI 31.96 kg/m  She has evidence of right torticollis and slight left shoulder elevation. She has a mild dystonic head tremor. She also has a voice tremor, mild and a very mild L laterocollis.   Written informed consent has previously been obtained and scanned into the patient's electronic chart. We will re-consent if the type of botulinum toxin or the dosing or the distribution changes in the future. The patient is informed that we will use the same consent for Her recurrent, most likely 3 monthly injections. I have previously talked to the patient in detail about expectations, limitations, benefits as well as potential adverse effects of botulinum toxin injections. The patient understands that the side effects are not limited to the ones I discussed, which include mouth dryness, dryness of eyes, speech and swallowing difficulties, respiratory depression or problems breathing, weakness of muscles including more distant muscles than the ones injected, flu-like symptoms, myalgias, injection site reactions such as redness, itching, swelling, pain, and infection.   150 units of botulinum toxin type A were reconstituted using preservative-free normal saline to a concentration of 10 units per 0.1 mL and drawn up into 1 mL tuberculin syringes. Lot number was [id number] and expiration date September 2018 for all  3  50 unit vials.    The patient was situated in a chair, sitting comfortably. After preparing the areas with 70% isopropyl alcohol and using a 26 gauge 1/2 inch hollow lumen recording EMG needle, a total dose of 130 units of botulinum toxin type A in the form of Xeomin was injected into the muscles and the following distribution and quantities:   #1: 25 units in  the left SCM #2: 15 units in the right SCM #3: 25 units in the left splenius capitis muscle #4: 10 units in the left semispinalis capitis muscle   #5: 10 units in the right semispinalis capitis muscle   #6: 25 units in the left levator scapulae muscle   #7: 20 units in the left upper trapezius muscle    EMG guidance was utilized for these injections with mild EMG activity noted, especially in the L levator scapulae muscle and left splenius capitis muscle, b/l SCMs.     A dose of 20 out of a total dose of 150 units was discarded as unavoidable waste.    The patient tolerated the procedure well without immediate complications. She was advised to make a followup appointment for repeat injections

## 2015-12-03 ENCOUNTER — Ambulatory Visit: Payer: Medicare Other | Admitting: Neurology

## 2015-12-03 ENCOUNTER — Telehealth: Payer: Self-pay

## 2015-12-03 NOTE — Telephone Encounter (Signed)
Patient did not show to appt today  

## 2015-12-03 NOTE — Telephone Encounter (Signed)
Patient came to office late, states that she had car trouble. Appointment was rescheduled to Thursday.

## 2015-12-05 ENCOUNTER — Ambulatory Visit: Payer: Medicare Other | Admitting: Neurology

## 2015-12-05 ENCOUNTER — Telehealth: Payer: Self-pay | Admitting: Neurology

## 2015-12-05 ENCOUNTER — Telehealth: Payer: Self-pay

## 2015-12-05 ENCOUNTER — Encounter: Payer: Self-pay | Admitting: Neurology

## 2015-12-05 NOTE — Telephone Encounter (Signed)
Alyssa Macdonald,  Dr. Rexene Alberts is out of the office for the next couple of weeks. Dr. Rexene Alberts states that it is ok for Dr. Krista Blue to inject this patient in her absence as long as it is ok with Dr. Krista Blue. I have not had a chance to ask Dr. Krista Blue, she is gone for the day. Can you check if this is ok with Dr. Krista Blue and reschedule?  Otherwise I can ask and schedule on Monday.  Thank you for your help.

## 2015-12-05 NOTE — Telephone Encounter (Signed)
Patient did not show to appointment today for Xeomin injections.

## 2015-12-05 NOTE — Telephone Encounter (Signed)
Pt called back and said her GPS could not find [address]. She asked if she came now would she be able to be seen.  I said I'm not sure. She said my next appt is 11/17. I advised her that she did not have an appt here on 11/17, her appt was today.  She said isn't that interesting. She said she would wait for her daughter to call and r/s the appt. This is the 2nd time I spoke with this patient. She sounds confused.

## 2015-12-05 NOTE — Telephone Encounter (Signed)
Pt called to advise she missed her appt today because "since you have moved I can't find [address]". I told her we had not moved since her last OV. "She said I did not drive there". She said she would call back to r/s

## 2015-12-05 NOTE — Telephone Encounter (Signed)
Pt called in to r/s her appt. Danielle was skyped. She asked to call the pt back tomorrow. Pt expressed understanding

## 2015-12-05 NOTE — Telephone Encounter (Signed)
Pt called back and apologized for calling again. She asked "if the office was ever close to [place]". I advised her it was on [facility] before moving to [place] . She said "I don't know where that is". I said do you know where the [facility] is, she said "yes", I said that is [facility]. She then said "[employer] is closer to [employer] than [employer]. I said no mam', [employer] is closer to [place] [employer]. She said "I will have my daughter to call". I said ok.

## 2015-12-05 NOTE — Telephone Encounter (Signed)
Pt called again, said "we never got anything settled on my appt". I advised her she spoke with [facility] around 4 and she would have to wait until Andee Poles returned tomorrow bc there is not availability to schedule the Xeomin in November.

## 2015-12-06 NOTE — Telephone Encounter (Signed)
I spoke to pt this am,  I did relay that LM with mobile (mike-son) about calling back for xeomin appt.    Pt was having a busy day today with family coming in town, and was cooking as I spoke.  She was alert, did know that we were on [address].  She took down information about upcoming appt 12-10-15 with Dr. Krista Blue at 0930.  I told her that we were concerned that she seemed confused at times Andee Poles and [place] spoke to her this week.  She related this to having about 6 things to do today ( and being very busy).  We will send note to Dr. Rexene Alberts.

## 2015-12-06 NOTE — Telephone Encounter (Signed)
Spoke with the patient and advised her that I would ask Dr. Krista Blue about injecting her next week. She was ok with that. I told her I would call her and let her know something today. She said "next week would really work better for me"

## 2015-12-06 NOTE — Telephone Encounter (Signed)
Patient called regarding rescheduling no show Xeomin appointment from yesterday, please call [phone].

## 2015-12-06 NOTE — Telephone Encounter (Signed)
° °  Spoke with the patient and advised her that I would ask Dr. Krista Blue about injecting her next week. She was ok with that. I told her I would call her and let her know something today. She said "next week would really work better for me"

## 2015-12-06 NOTE — Telephone Encounter (Signed)
I asked Alyssa Newcomer RN to call the patient and check on her since she was appearing to have some confusion about her apt's. We scheduled the patient for an apt on November the 7th at 9:30 am. (The apt has been blocked by [facility]). Alyssa Macdonald verified that the patient knew where our office was and also gave her our phone number.

## 2015-12-06 NOTE — Telephone Encounter (Signed)
I spoke to daughter, [name].  Relayed concern about her mother being confused.    She stated this is a progressive problem that they have observed. Her father had dementia.   They have appt with pcp on Thursday to evaluate (she would be going with her).   I told her that there are some metabolic problems that can cause memory/confusion and pcp can check (thyroid, B12, homocysteine, RPR).  [facility] can do this (usually acute problem).  If pcp sees her and thinks she needs neurologist, then she can be referred back here for new problem , and Dr. Rexene Alberts would be glad to see her. (she now see's her for xeomin injections).  Daughter was appreciative of call.

## 2015-12-10 ENCOUNTER — Ambulatory Visit: Payer: Self-pay | Admitting: Neurology

## 2015-12-10 ENCOUNTER — Telehealth: Payer: Self-pay | Admitting: *Deleted

## 2015-12-10 ENCOUNTER — Encounter: Payer: Self-pay | Admitting: Neurology

## 2015-12-10 NOTE — Telephone Encounter (Addendum)
No show. Patient arrived 30 minutes late to her appt.  Stated she had car problems.

## 2015-12-10 NOTE — Telephone Encounter (Signed)
Conversation: No Show  December 06, 2015  Alyssa Macdonald  to Star Age, MD     9:31 AM  Note    I asked Lovey Newcomer RN to call the patient and check on her since she was appearing to have some confusion about her apt's. We scheduled the patient for an apt on November the 7th at 9:30 am. (The apt has been blocked by [facility]). Lovey Newcomer verified that the patient knew where our office was and also gave her our phone number.

## 2015-12-10 NOTE — Telephone Encounter (Deleted)
Patient arrived to our office at 10am for her 9:30am appt.  States she was late due to car problems.

## 2015-12-10 NOTE — Telephone Encounter (Signed)
12/03/15 11:21 AM  Note    Patient came to office late, states that she had car trouble. Appointment was rescheduled to Thursday.     Laurence Spates, RN      12/03/15 10:48 AM  Note    Patient did not show to appt today

## 2015-12-10 NOTE — Telephone Encounter (Signed)
No showed appointment for Xeomin injection.

## 2015-12-11 ENCOUNTER — Telehealth: Payer: Self-pay | Admitting: Neurology

## 2015-12-11 ENCOUNTER — Ambulatory Visit (INDEPENDENT_AMBULATORY_CARE_PROVIDER_SITE_OTHER): Payer: Medicare Other | Admitting: Neurology

## 2015-12-11 DIAGNOSIS — G250 Essential tremor: Secondary | ICD-10-CM | POA: Diagnosis not present

## 2015-12-11 DIAGNOSIS — G243 Spasmodic torticollis: Secondary | ICD-10-CM

## 2015-12-11 DIAGNOSIS — J383 Other diseases of vocal cords: Secondary | ICD-10-CM

## 2015-12-11 MED ORDER — INCOBOTULINUMTOXINA 100 UNITS IM SOLR
100.0000 [IU] | INTRAMUSCULAR | Status: DC
  Administered 2015-12-11: 100 [IU] via INTRAMUSCULAR

## 2015-12-11 MED ORDER — INCOBOTULINUMTOXINA 50 UNITS IM SOLR
50.0000 [IU] | INTRAMUSCULAR | Status: DC
  Administered 2015-12-11: 50 [IU] via INTRAMUSCULAR

## 2015-12-11 NOTE — Progress Notes (Signed)
**  Xeomin 100 units x 1 vial, [place] 0259-1610-01, Lot [id number], Exp 04/2017, office supply.//mck,rn.** **Xeomin 50 units x 1 vial, [place] [id number], Lot [id number], Exp 10/2016, office supply.//mck,rn.**

## 2015-12-11 NOTE — Progress Notes (Signed)
PATIENT: Alyssa Macdonald DOB: July 10, 1945  No chief complaint on file.    HISTORICAL  Alyssa Macdonald is 70 years old right-handed female, seen in refer by Dr.Athar for continued EMG guided botulism toxin injection for her cervical dystonia, head titubation, her primary care physician is Dr.Tammy Arn Medal, initial evaluation is on December 11 2015.  She reported a lifelong history of head tremor, tremorish voiced, she remember that her classmates made fun of her when she presented in front of her class because of her head titubation, and voice tremor. She has occasionally bilateral hands tremor, but over the years, she only had slow progression. 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  Currently she enjoys reading and sewing without much difficulty. Her maternal great aunt had similar head titubation, she denied other family members of similar condition.  She was initially evaluated by Dr.Athar in December 2015, started Botox injection since January 2017, she has been receiving injection every 3 months, she reported mild to moderate improvement, she has less head titubation, there was no significant side effect noticed. Most recent injection was by Dr. Rexene Alberts on August 29 2015, she received botulism toxin a 130 units under EMG guidance.  In addition she noticed slight worsening of her voice tremor, hope seeking improvement  She also had past medical history of hypertension, hyperlipidemia, endometriosis, osteoarthritis, acid reflux.  She denies significant gait abnormality, denies significant neck pain.  REVIEW OF SYSTEMS: Full 14 system review of systems performed and notable only for as above. ALLERGIES: Allergies  Allergen Reactions  . Zocor [Simvastatin]     Myalgia    HOME MEDICATIONS: Current Outpatient Prescriptions  Medication Sig Dispense Refill  . clonazePAM (KLONOPIN) 0.5 MG tablet Take 0.5 mg by mouth.    .  incobotulinumtoxinA (XEOMIN) 100 units SOLR injection Inject into the muscle.    . multivitamin-iron-minerals-folic acid (CENTRUM) chewable tablet Chew 1 tablet by mouth daily.    Marland Kitchen omeprazole (PRILOSEC) 20 MG capsule Take 20 mg by mouth.    . propranolol ER (INDERAL LA) 60 MG 24 hr capsule TAKE ONE CAPSULE BY MOUTH ONE TIME DAILY 90 capsule 4  . traZODone (DESYREL) 50 MG tablet Take 50 mg by mouth.     Current Facility-Administered Medications  Medication Dose Route Frequency Provider Last Rate Last Dose  . incobotulinumtoxinA (XEOMIN) 100 UNITS injection 150 Units  150 Units Intramuscular Once Drema Dallas, DO      . incobotulinumtoxinA (XEOMIN) 50 units injection 150 Units  150 Units Intramuscular Q90 days Star Age, MD   150 Units at 08/29/15 1129    PAST MEDICAL HISTORY: Past Medical History:  Diagnosis Date  . Cervical dystonia   . Complication of anesthesia    slow to awaken  . Endometriosis   . Esophageal reflux   . Essential and other specified forms of tremor   . GERD (gastroesophageal reflux disease)   . Hyperlipidemia   . Hypertension   . Hypopotassemia   . Osteoarthrosis involving, or with mention of more than one site, but not specified as generalized, site unspecified(715.80)   . Osteoarthrosis involving, or with mention of more than one site, but not specified as generalized, site unspecified(715.80)   . 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 Trigger finger     PAST SURGICAL HISTORY: Past Surgical History:  Procedure Laterality Date  . ABDOMINAL HYSTERECTOMY    . APPENDECTOMY    .  CHOLECYSTECTOMY    . COLON SURGERY     bowel resection  . FOOT SURGERY    . JOINT REPLACEMENT     bilateral  . TONSILLECTOMY    . TOTAL KNEE ARTHROPLASTY     Bilateral  . TRIGGER FINGER RELEASE  02/01/2012   Procedure: RELEASE TRIGGER FINGER/A-1 PULLEY;  Surgeon: Jolyn Nap, MD;  Location: [facility];  Service: Orthopedics;  Laterality:  Right;  RIGHT LONG FINGER  TRIGGER FINGER RELEASE    FAMILY HISTORY: Family History  Problem Relation Age of Onset  . CVA Mother   . Diabetes Father   . Parkinson's disease Paternal Uncle   . Cataracts Sister     SOCIAL HISTORY:  Social History   Social History  . Marital status: Widowed    Spouse name: Ronalee Belts  . Number of children: 2  . Years of education: 14   Occupational History  . RETIRED     Social History Main Topics  . Smoking status: Never Smoker  . Smokeless tobacco: Never Used  . Alcohol use Yes     Comment: Occasional,one glass of wine weekly  . Drug use: No  . Sexual activity: Not Currently    Partners: Male   Other Topics Concern  . Not on file   Social History Narrative   Marital Status:Widowed Ronalee Belts)    Children:  Son Clare Gandy) Daughter [name])    Pets: Dog Terri Piedra)   Living Situation: Lives alone   Occupation: Retired     Education: Geophysicist/field seismologist in [place] Use/Exposure:  None    Alcohol Use:  Occasional 1-2 per week   Drug Use:  None   Diet:  Regular   Exercise:  None   Hobbies: Reading, Quilting   Patient is right handed                       PHYSICAL EXAM   There were no vitals filed for this visit.  Not recorded      There is no height or weight on file to calculate BMI.  PHYSICAL EXAMNIATION:  Gen: NAD, conversant, well nourised, obese, well groomed                     Cardiovascular: Regular rate rhythm, no peripheral edema, warm, nontender. Eyes: Conjunctivae clear without exudates or hemorrhage Neck: Supple, no carotid bruits. Pulmonary: Clear to auscultation bilaterally   NEUROLOGICAL EXAM:  MENTAL STATUS: She has constant no-no had shaking, with mild left tilt, right turn, slight left shoulder elevation, Speech:    Speech is normal; fluent and spontaneous with normal comprehension.  Cognition:     Orientation to time, place and person     Normal recent and remote memory     Normal Attention  span and concentration     Normal Language, naming, repeating,spontaneous speech     Fund of knowledge   CRANIAL NERVES: CN II: Visual fields are full to confrontation. Fundoscopic exam is normal with sharp discs and no vascular changes. Pupils are round equal and briskly reactive to light. CN III, IV, VI: extraocular movement are normal. No ptosis. CN V: Facial sensation is intact to pinprick in all 3 divisions bilaterally. Corneal responses are intact.  CN VII: Face is symmetric with normal eye closure and smile. CN VIII: Hearing is normal to rubbing fingers CN IX, X: Palate elevates symmetrically. Phonation is normal. CN XI: Head turning and shoulder shrug  are intact CN XII: Tongue is midline with normal movements and no atrophy.  MOTOR: Muscle bulk and tone are normal. Muscle strength is normal.She has occasionally bilateral hands postural tremor, no rigidity  REFLEXES: Reflexes are 2+ and symmetric at the biceps, triceps, knees, and ankles. Plantar responses are flexor.  SENSORY: Intact to light touch, pinprick, positional sensation and vibratory sensation are intact in fingers and toes.  COORDINATION: Rapid alternating movements and fine finger movements are intact. There is no dysmetria on finger-to-nose and heel-knee-shin.    GAIT/STANCE: Posture is normal. Gait is steady with normal steps, base, arm swing, and turning. Heel and toe walking are normal. Tandem gait is normal.  Romberg is absent.   DIAGNOSTIC DATA (LABS, IMAGING, TESTING) - I reviewed patient records, labs, notes, testing and imaging myself where available.   ASSESSMENT AND PLAN  Alyssa Macdonald is a 70 y.o. female   Cervical dystonia Essential tremor, with significant head titubation She has constant no-no head titubation, mild left tilt, right turn, slight left shoulder elevation  Under EMG guidance, we used 150 units of xeomin today, Left inferior oblique capitis 25 units Right inferior oblique  capitis 25 units  Left longissimus capitis 25 units Right longissimus capitis 25 units  Left semispinalis 25 units Right semispinalis 25 units  Spastic dysphonia Refer her to Dr. Redmond Baseman ENT for evaluation, if indicated, may come to clinic for EMG guided laryngeal muscle injection  Marcial Pacas, M.D. Ph.D.  [facility] [address] [place], [place] [address] Ph: [phone] Fax: [phone]  CC: Referring Provider

## 2015-12-11 NOTE — Patient Instructions (Signed)
  Leane Para. Redmond Baseman, MD ENT (Otolaryngology)    [place] [id number] [place], [place] [address] Appointments: [phone]

## 2015-12-11 NOTE — Telephone Encounter (Signed)
PT NEEDS APPT 3 MONTHS EMG GUIDED XEOMIN INJ

## 2015-12-17 NOTE — Telephone Encounter (Signed)
Pt appt scheduled for 03/18/16

## 2015-12-17 NOTE — Telephone Encounter (Signed)
Would you mind calling to schedule the patient?

## 2016-01-14 ENCOUNTER — Ambulatory Visit: Payer: Medicare Other | Admitting: Neurology

## 2016-03-11 ENCOUNTER — Encounter: Payer: Self-pay | Admitting: Neurology

## 2016-03-11 ENCOUNTER — Ambulatory Visit (INDEPENDENT_AMBULATORY_CARE_PROVIDER_SITE_OTHER): Payer: Medicare Other | Admitting: Neurology

## 2016-03-11 VITALS — BP 151/90 | HR 67 | Resp 20 | Ht 66.0 in | Wt 187.0 lb

## 2016-03-11 DIAGNOSIS — F3289 Other specified depressive episodes: Secondary | ICD-10-CM | POA: Diagnosis not present

## 2016-03-11 DIAGNOSIS — G252 Other specified forms of tremor: Secondary | ICD-10-CM | POA: Diagnosis not present

## 2016-03-11 DIAGNOSIS — G243 Spasmodic torticollis: Secondary | ICD-10-CM | POA: Diagnosis not present

## 2016-03-11 DIAGNOSIS — R413 Other amnesia: Secondary | ICD-10-CM

## 2016-03-11 DIAGNOSIS — R498 Other voice and resonance disorders: Secondary | ICD-10-CM

## 2016-03-11 NOTE — Patient Instructions (Addendum)
We will do a brain scan, called MRI and call you with the test results. We will have to schedule you for this on a separate date. This test requires authorization from your insurance, and we will take care of the insurance process.  We will do a formal neuropsychological test (aka cognitive testing) for your memory complaints. This requires a referral to a trained and licensed neuropsychologist and will be a separate appointment at a different clinic.  You have complaints of memory loss: memory loss or changes in cognitive function can have many reasons and does not always mean you have dementia. Conditions that can contribute to subjective or objective memory loss include: depression, stress, poor sleep from insomnia or sleep apnea, dehydration, fluctuation in blood sugar values, thyroid or electrolyte dysfunction and certain vitamin deficiencies. Dementia can be caused by stroke, brain atherosclerosis or brain vascular disease due to vascular risk factors (smoking, high blood pressure, high cholesterol, obesity and uncontrolled diabetes), certain degenerative brain disorders (including Parkinson's disease and Multiple sclerosis) and by Alzheimer's disease or other, more rare and sometimes hereditary causes. We will do some additional testing: We will do a brain scan. We will not start medication as yet. We will also request a formal cognitive test called neuropsychological evaluation which is done by a licensed neuropsychologist. We will make a referral in that regard. We will call you with brain scan test results and monitor your symptoms. You are not drinking enough water, please increase your water intake to about 6 cups per day.  Please talk to your primary care about your depression and you may benefit from seeing a psychiatrist or counselor.

## 2016-03-11 NOTE — Progress Notes (Signed)
Subjective:    Patient ID: Alyssa Macdonald is a 71 y.o. female.  HPI    Star Age, MD, PhD [facility] [address] [place], [place] [address]  [name] is a 71 y.o. female with an underlying medical history of hypertension, hyperlipidemia, reflux disease, trigger finger, tremor, endometriosis, and osteoarthritis, cervical dystonia for which she has been receiving Botox injections on a regular basis, last injection on 12/11/2015 under Dr. Krista Blue, previous injections with me, who is referred for a new problem of memory loss. She is referred by her PCP, Dr. Luciana Axe.   Today, 03/11/2016 (all dictated new, as well as above notes, some dictation done in note pad or Word, outside of chart, may appear as copied): She is unaccompanied today. She reports memory issues "all her life", denies issues driving, feels like she has issues with paying attention. Mother had AD, mother was around 64 when she passed, but she does not know. Her father lived to be 69s, had emphysema. She herself does not smoke, occasional wine, drinks 2 cups of coffee in AM. Has not slept well since her husband passed in 10/14. She is still on clonazepam. I reviewed Dr. Merilyn Baba note from 12/12/15, and patient was advised to continue with propranolol, and stop the clonazepam, but continues to take it at the current dose of 0.5 mg qHS, has trazodone, but does not take it.  She endorses symptoms of depression since her husband passed away. She has had intermittent suicidal ideations, no current suicidal ideations, no plan.   She had a brain MRI with and without contrast on 10/20/2012 which I reviewed: IMPRESSION:  Slightly abnormal MRI scan of the brain showing mild changes of chronic microvascular ischemia.   Previously (copied from previous notes for reference):   She had an injection on 08/29/2015, at which time she reported doing well and she felt that the medication had worked quite  well, no side effects were reported.  She had injection on 05/27/2015, at which time she reported doing fairly well. She had received her previous injection under Dr. Posey Pronto. We reviewe I reviewed her note at the time. She had an injection in January 2017 and follow-up with him in March 2017. She reported no side effects. She was agreeable with pursuing her previous injections that I did.    She had an injection with me on 10/24/2014, at which time she received a total dose of 130 units of Xeomin.   She received an injection under Dr. Posey Pronto with physical rehabilitation 02/15/2015 for total dose of 160 units. She was seen in follow-up in March 2017. I reviewed the office note and procedure note from Dr. Posey Pronto.   Her overall seventh injection was on 07/24/2014, at which time she received a total dose of 130 units of Xeomin.   Her 6th injection was on 04/19/2014, at which time she received a total dose of 130 units of Xeomin.   Her 5th injection was on 01/19/2014, at which time she was re-consented her as this was the first injection with me.   She previously followed with Dr. Janann Colonel and her last injection with him was on 10/11/2013 (4th), at which time she received a total dose of 135 units of botulinum toxin type A in the form of Xeomin.   Her 3rd injection was on 07/04/2013 at which time she reported good benefit to her symptoms and no adverse effects. She felt it wore off after several weeks,  approximately 2-3 weeks before she was due for another injection.   Her first injection was on 12/22/2013 and her second injection was on 03/24/2013. The condition has existed for more than 6 months, and pt does not have a diagnosis of ALS, Myasthenia Gravis or Lambert-Eaton Syndrome.      Her Past Medical History Is Significant For: Past Medical History:  Diagnosis Date  . Cervical dystonia   . Complication of anesthesia    slow to awaken  . Endometriosis   . Esophageal reflux   . Essential and  other specified forms of tremor   . GERD (gastroesophageal reflux disease)   . Hyperlipidemia   . Hypertension   . Hypopotassemia   . Osteoarthrosis involving, or with mention of more than one site, but not specified as generalized, site unspecified(715.80)   . Osteoarthrosis involving, or with mention of more than one site, but not specified as generalized, site unspecified(715.80)   . 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 Trigger finger     Her Past Surgical History Is Significant For: Past Surgical History:  Procedure Laterality Date  . ABDOMINAL HYSTERECTOMY    . APPENDECTOMY    . CHOLECYSTECTOMY    . COLON SURGERY     bowel resection  . FOOT SURGERY    . JOINT REPLACEMENT     bilateral  . TONSILLECTOMY    . TOTAL KNEE ARTHROPLASTY     Bilateral  . TRIGGER FINGER RELEASE  02/01/2012   Procedure: RELEASE TRIGGER FINGER/A-1 PULLEY;  Surgeon: Jolyn Nap, MD;  Location: [facility];  Service: Orthopedics;  Laterality: Right;  RIGHT LONG FINGER  TRIGGER FINGER RELEASE    Her Family History Is Significant For: Family History  Problem Relation Age of Onset  . CVA Mother   . Diabetes Father   . Parkinson's disease Paternal Uncle   . Cataracts Sister     Her Social History Is Significant For: Social History   Social History  . Marital status: Widowed    Spouse name: Ronalee Belts  . Number of children: 2  . Years of education: 14   Occupational History  . RETIRED     Social History Main Topics  . Smoking status: Never Smoker  . Smokeless tobacco: Never Used  . Alcohol use Yes     Comment: Occasional,one glass of wine weekly  . Drug use: No  . Sexual activity: Not Currently    Partners: Male   Other Topics Concern  . None   Social History Narrative   Marital Status:Widowed Ronalee Belts)    Children:  Son Clare Gandy) Daughter [name])    Pets: Dog Terri Piedra)   Living Situation: Lives alone   Occupation: Retired     Education: Geophysicist/field seismologist  in [place] Use/Exposure:  None    Alcohol Use:  Occasional 1-2 per week   Drug Use:  None   Diet:  Regular   Exercise:  None   Hobbies: Reading, Quilting   Patient is right handed                      Her Allergies Are:  Allergies  Allergen Reactions  . Zocor [Simvastatin]     Myalgia  :   Her Current Medications Are:  Outpatient Encounter Prescriptions as of 03/11/2016  Medication Sig  . clonazePAM (KLONOPIN) 0.5 MG tablet Take 0.5 mg by mouth.  . incobotulinumtoxinA (XEOMIN) 100 units SOLR injection Inject into the  muscle.  . multivitamin-iron-minerals-folic acid (CENTRUM) chewable tablet Chew 1 tablet by mouth daily.  Marland Kitchen omeprazole (PRILOSEC) 20 MG capsule Take 20 mg by mouth.  . propranolol ER (INDERAL LA) 60 MG 24 hr capsule TAKE ONE CAPSULE BY MOUTH ONE TIME DAILY  . traZODone (DESYREL) 50 MG tablet Take 50 mg by mouth.   Facility-Administered Encounter Medications as of 03/11/2016  Medication  . incobotulinumtoxinA (XEOMIN) 100 units injection 100 Units  . incobotulinumtoxinA (XEOMIN) 100 UNITS injection 150 Units  . incobotulinumtoxinA (XEOMIN) 50 units injection 150 Units  . incobotulinumtoxinA (XEOMIN) 50 units injection 50 Units  :   Review of Systems:  Out of a complete 14 point review of systems, all are reviewed and negative with the exception of these symptoms as listed below:  Review of Systems  Neurological:       Pt presents today to discuss her memory. Pt thinks she needs her xeomin injections today.    Objective:  Neurologic Exam  Physical Exam Physical Examination:   Vitals:   03/11/16 1333  BP: (!) 151/90  Pulse: 67  Resp: 20    General Examination: The patient is a very pleasant 71 y.o. female in no acute distress. She appears well-developed and well-nourished and well groomed.   HEENT: Normocephalic, atraumatic, pupils are equal, round and reactive to light and accommodation. Funduscopic exam is normal with sharp disc  margins noted. S/p cataract repairs b/l. Extraocular tracking is good without limitation to gaze excursion or nystagmus noted. Normal smooth pursuit is noted. Hearing is grossly intact. Face is symmetric with normal facial animation and normal facial sensation. Speech is clear with no dysarthria noted. There is no hypophonia. There is a side to side head/neck tremor and mild voice tremor. Mild R torticollis, L laterocollis, L shoulder higher. Oropharynx exam reveals: moderate mouth dryness, adequate dental hygiene and mild airway crowding, due to redundant soft palate. Mallampati is class II. Tongue protrudes centrally and palate elevates symmetrically. Tonsils are absent.   Chest: Clear to auscultation without wheezing, rhonchi or crackles noted.  Heart: S1+S2+0, regular and normal without murmurs, rubs or gallops noted.   Abdomen: Soft, non-tender and non-distended with normal bowel sounds appreciated on auscultation.  Extremities: There is no pitting edema in the distal lower extremities bilaterally. Pedal pulses are intact.  Skin: Warm and dry without trophic changes noted.  Musculoskeletal: exam reveals no obvious joint deformities, tenderness or joint swelling or erythema. S/p b/l TKA.  Neurologically:  Mental status: The patient is awake, alert and oriented in all 4 spheres. Her immediate and remote memory, attention, language skills and fund of knowledge are appropriate. There is no evidence of aphasia, agnosia, apraxia or anomia. Speech is clear with normal prosody and enunciation. Thought process is linear. Mood is constricted and affect is normal.   On 03/11/2016: MOCA 22/30, CDT: 4/5, category fluency 11/min. (She missed 5 points on remote recall, one point on repetition, one point on clock drawing, one point on orientation for exact dates).   Cranial nerves II - XII are as described above under HEENT exam. In addition: shoulder shrug is normal with equal shoulder height noted. Motor  exam: Normal bulk, strength and tone is noted. There is no drift, tremor or rebound. Romberg is negative. Reflexes are 1+ throughout. Fine motor skills and coordination: intact with normal finger taps, normal hand movements, normal rapid alternating patting, normal foot taps and normal foot agility.  Cerebellar testing: No dysmetria or intention tremor on finger to nose testing.  Heel to shin is unremarkable bilaterally. There is no truncal or gait ataxia.  Sensory exam: intact to light touch in the upper and lower extremities.  Gait, station and balance: She stands easily. No veering to one side is noted. No leaning to one side is noted. Posture is age-appropriate and stance is narrow based. Gait shows normal stride length and normal pace. No problems turning are noted.   Assessment and Plan:    In summary, SIRENA KISSELBURG is a very pleasant 71 y.o.-year old female with an underlying medical history of hypertension, hyperlipidemia, reflux disease, trigger finger, tremor, endometriosis, and osteoarthritis, status post bilateral knee replacement surgeries, status post cataract repairs, obesity, cervical dystonia for which she has been receiving Botox injections, who presents for initial evaluation of her memory loss. It is unclear how long she has had memory issues. She is by herself for the appointment today. She has had forgetfulness and feels that she has not been paying attention very much. Her MOCA score is abnormal today, we will proceed with further and more formal memory testing in the form of neuropsychological evaluation and I made a referral to neuropsychology. Of note, she endorses significant symptoms of depression and even suicidal ideations at times, no current plan and no current suicidal ideations but is strongly advised to address this with her primary care physician, she may benefit from seeing a psychiatrist or psychologist. Furthermore, she is not well hydrated and well rested. She does not  endorse symptoms of obstructive sleep apnea but she lives alone. She may benefit from a sleep study and we will address in the near future. She had a brain MRI in 2014 with fairly age-appropriate findings and mild microvascular changes, I will order a repeat brain MRI for comparison. She is advised to stay better hydrated with water. I had a long chat with the patient about my findings and the diagnosis of memory loss and dementia, its prognosis and treatment options. We will not initiate any medications as yet. We talked about non-pharmacological approaches. We talked about maintaining a healthy lifestyle in general and staying active mentally and physically. I encouraged the patient to eat healthy, exercise daily and keep well hydrated, to keep a scheduled bedtime and wake time routine, to not skip any meals and eat healthy snacks in between meals and to have protein with every meal. I stressed the importance of regular exercise, within of course the patient's own mobility limitations.   I suggested a 3 month follow-up. Of note, she will have repeat botulinum toxin injection next week for her cervical dystonia.  I answered all her questions today and the patient was in agreement with the above outlined plan. Star Age, MD, PhD

## 2016-03-18 ENCOUNTER — Telehealth: Payer: Self-pay | Admitting: Neurology

## 2016-03-18 ENCOUNTER — Encounter: Payer: Self-pay | Admitting: Neurology

## 2016-03-18 ENCOUNTER — Ambulatory Visit: Payer: Medicare Other | Admitting: Neurology

## 2016-03-18 ENCOUNTER — Ambulatory Visit (INDEPENDENT_AMBULATORY_CARE_PROVIDER_SITE_OTHER): Payer: Medicare Other | Admitting: Neurology

## 2016-03-18 VITALS — BP 135/80 | HR 63 | Resp 20 | Ht 66.0 in | Wt 186.0 lb

## 2016-03-18 DIAGNOSIS — G252 Other specified forms of tremor: Secondary | ICD-10-CM

## 2016-03-18 DIAGNOSIS — G243 Spasmodic torticollis: Secondary | ICD-10-CM | POA: Diagnosis not present

## 2016-03-18 MED ORDER — INCOBOTULINUMTOXINA 50 UNITS IM SOLR
50.0000 [IU] | INTRAMUSCULAR | Status: DC
  Administered 2016-03-18: 50 [IU] via INTRAMUSCULAR

## 2016-03-18 MED ORDER — INCOBOTULINUMTOXINA 100 UNITS IM SOLR
100.0000 [IU] | INTRAMUSCULAR | Status: DC
  Administered 2016-03-18: 100 [IU] via INTRAMUSCULAR

## 2016-03-18 NOTE — Patient Instructions (Signed)
As discussed, botulinum toxin takes about 3-7 days to kick in. Please remember, this is not a pain shot, this is to gradually improve your symptoms. In some patients it takes up to 2-3 weeks to make a difference and it wears off with time. Sometimes it may wear off before it is time for the next injection. We still should wait till the next 3 monthly injection, because injecting too frequently may cause you to develop immunity to the botulinum toxin. We are looking for a reduction in your headache frequency and headache severity. Side effects to look out for are mouth dryness, dryness of the eyes, heaviness of your head or muscle weakness, rarely, speech or swallowing difficulties and very rarely breathing difficulties. Some people have transient neck pain or soreness which typically responds to over-the-counter anti-inflammatory medication and local heat application with a heat pad. If you think you have a severe reaction to the botulinum toxin, such as weakness, trouble speaking, trouble breathing, or trouble swallowing, you have to call 911 or have someone take you to the nearest emergency room. However, most people have no side effects from the injections. It is normal to have a little bit of redness and swelling around the injection sites which usually improves after a few hours. Rarely, there may be a bruise that improves on its own. Most side effects reported are very mild and resolve within 10-14 days. Please feel free to call us if you have any additional questions or concerns: [phone]. Please give us a call in about a month to report how you are doing after the botulinum toxin injections or to just give us an update. Call sooner, if you have any concerns, questions or feel that you have potential side effects. We may have to adjust the dose over time, depending on your results from this injection and your overall response over time to this medication.   

## 2016-03-18 NOTE — Progress Notes (Signed)
Alyssa Macdonald is a 71 y.o. female with an underlying medical history of hypertension, hyperlipidemia, reflux disease, trigger finger, tremor, endometriosis, and osteoarthritis, memory loss and cervical dystonia, who presents for her scheduled botulinum toxin injection. Her last injection was under Dr. Krista Blue on 12/11/2015. I have seen her in the interim on 03/11/2016 as a new problem referral for memory loss.   Today, 03/18/2016 (all dictated new, as well as above notes, some dictation done in note pad or Word, outside of chart, may appear as copied): She reports having done well, no side effects with the last injections, no new symptoms.   O/E: BP 135/80   Pulse 63   Resp 20   Ht 5\' 6"  (1.676 m)   Wt 186 lb (84.4 kg)   BMI 30.02 kg/m : She has evidence of mild right torticollis and slight left shoulder elevation. 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 She also has a voice tremor, mild and a very mild L laterocollis, stable.   Written informed consent has previously been obtained and scanned into the patient's electronic chart. We will re-consent if the type of botulinum toxin or the dosing or the distribution changes in the future. The patient is informed that we will use the same consent for Her recurrent, most likely 3 monthly injections. I have previously talked to the patient in detail about expectations, limitations, benefits as well as potential adverse effects of botulinum toxin injections. The patient understands that the side effects are not limited to the ones I discussed, which include mouth dryness, dryness of eyes, speech and swallowing difficulties, respiratory depression or problems breathing, weakness of muscles including more distant muscles than the ones injected, flu-like symptoms, myalgias, injection site reactions such as redness, itching, swelling, pain, and infection.   150 units of botulinum toxin type A were reconstituted using preservative-free normal saline to a  concentration of 10 units per 0.1 mL and drawn up into 1 mL tuberculin syringes. Lot number was [id number] and expiration date April 2020 for the 100 unit vial, lot [id number] and expiration date March 2019 for the 50 unit vial.    The patient was situated in a chair, sitting comfortably. After preparing the areas with 70% isopropyl alcohol and using a 26 gauge 1/2 inch hollow lumen recording EMG needle, a total dose of 130 units of botulinum toxin type A in the form of Xeomin was injected into the muscles and the following distribution and quantities:   #1: 25 units in the left SCM #2: 15 units in the right SCM #3: 25 units in the left splenius capitis muscle #4: 10 units in the left semispinalis capitis muscle   #5: 10 units in the right semispinalis capitis muscle   #6: 25 units in the left levator scapulae muscle   #7: 20 units in the left upper trapezius muscle    EMG guidance was utilized for these injections with mild EMG activity noted, especially in the levator scapulae on the left, bilateral splenius capitis muscles, bilateral SCM's, bilateral semispinalis capitis muscles.     A dose of 20 out of a total dose of 150 units was discarded as unavoidable waste.    The patient tolerated the procedure well without immediate complications. She was advised to make a followup appointment for repeat injections   Previously (copied from previous notes for reference):   03/11/2016: She reports memory issues "all her life", denies issues driving, feels like she has issues with paying attention. Mother had AD,  mother was around 5 when she passed, but she does not know. Her father lived to be 20s, had emphysema. She herself does not smoke, occasional wine, drinks 2 cups of coffee in AM. Has not slept well since her husband passed in 10/14. She is still on clonazepam. I reviewed Dr. Merilyn Baba note from 12/12/15, and patient was advised to continue with propranolol, and stop the clonazepam, but continues to take it  at the current dose of 0.5 mg qHS, has trazodone, but does not take it.  She endorses symptoms of depression since her husband passed away. She has had intermittent suicidal ideations, no current suicidal ideations, no plan.    She had a brain MRI with and without contrast on 10/20/2012 which I reviewed: IMPRESSION:  Slightly abnormal MRI scan of the brain showing mild changes of chronic microvascular ischemia.   She had an injection on 08/29/2015, at which time she reported doing well and she felt that the medication had worked quite well, no side effects were reported.   She had injection on 05/27/2015, at which time she reported doing fairly well. She had received her previous injection under Dr. Posey Pronto. We reviewe I reviewed her note at the time. She had an injection in January 2017 and follow-up with him in March 2017. She reported no side effects. She was agreeable with pursuing her previous injections that I did.    She had an injection with me on 10/24/2014, at which time she received a total dose of 130 units of Xeomin.    She received an injection under Dr. Posey Pronto with physical rehabilitation 02/15/2015 for total dose of 160 units. She was seen in follow-up in March 2017. I reviewed the office note and procedure note from Dr. Posey Pronto.    Her overall seventh injection was on 07/24/2014, at which time she received a total dose of 130 units of Xeomin.    Her 6th injection was on 04/19/2014, at which time she received a total dose of 130 units of Xeomin.    Her 5th injection was on 01/19/2014, at which time she was re-consented her as this was the first injection with me.    She previously followed with Dr. Janann Colonel and her last injection with him was on 10/11/2013 (4th), at which time she received a total dose of 135 units of botulinum toxin type A in the form of Xeomin.    Her 3rd injection was on 07/04/2013 at which time she reported good benefit to her symptoms and no adverse effects. She  felt it wore off after several weeks, approximately 2-3 weeks before she was due for another injection.    Her first injection was on 12/22/2013 and her second injection was on 03/24/2013. The condition has existed for more than 6 months, and pt does not have a diagnosis of ALS, Myasthenia Gravis or Lambert-Eaton Syndrome.

## 2016-03-18 NOTE — Telephone Encounter (Signed)
Pt needs 3 month xeomin per dr Rexene Alberts.

## 2016-03-19 NOTE — Telephone Encounter (Signed)
Called and scheduled patient

## 2016-03-27 ENCOUNTER — Ambulatory Visit
Admission: RE | Admit: 2016-03-27 | Discharge: 2016-03-27 | Disposition: A | Discharge status: Home / Self Care | Payer: Medicare Other | Source: Ambulatory Visit | Attending: Neurology | Admitting: Neurology

## 2016-03-27 DIAGNOSIS — F3289 Other specified depressive episodes: Secondary | ICD-10-CM

## 2016-03-27 DIAGNOSIS — G243 Spasmodic torticollis: Secondary | ICD-10-CM | POA: Diagnosis not present

## 2016-03-27 DIAGNOSIS — G252 Other specified forms of tremor: Secondary | ICD-10-CM

## 2016-03-27 DIAGNOSIS — R498 Other voice and resonance disorders: Secondary | ICD-10-CM

## 2016-03-27 DIAGNOSIS — R413 Other amnesia: Secondary | ICD-10-CM

## 2016-03-30 ENCOUNTER — Telehealth: Payer: Self-pay

## 2016-03-30 NOTE — Telephone Encounter (Signed)
-----   Message from Star Age, MD sent at 03/30/2016  7:48 AM EST ----- Please call patient regarding the recent brain MRI: The brain scan showed a normal structure of the brain and mild volume loss which we call atrophy. There were changes in the deeper structures of the brain, which we call white matter changes or microvascular changes. These were reported as mild in Her case. These are tiny white spots, that occur with time and are seen in a variety of conditions, including with normal aging, chronic hypertension, chronic headaches, especially migraine HAs, chronic diabetes, chronic hyperlipidemia. These are not strokes and no mass or lesion were seen which is reassuring. Again, there were no acute findings, such as a stroke, or mass or blood products. Also, compared to 2014, findings were deemed stable.  No further action is required on this test at this time, other than re-enforcing the importance of good blood pressure control, good cholesterol control, good blood sugar control, and weight management. Please remind patient to keep any upcoming appointments or tests and to call us with any interim questions, concerns, problems or updates. She is scheduled with Dr. Si Raider next month for cogn testing.   Thanks,  Star Age, MD, PhD

## 2016-03-30 NOTE — Telephone Encounter (Signed)
I called pt and advised her of her MRI brain results. Pt will keep her appt with Dr. Si Raider next month. Pt verbalized understanding of results. Pt had no questions at this time but was encouraged to call back if questions arise.

## 2016-03-30 NOTE — Progress Notes (Signed)
Please call patient regarding the recent brain MRI: The brain scan showed a normal structure of the brain and mild volume loss which we call atrophy. There were changes in the deeper structures of the brain, which we call white matter changes or microvascular changes. These were reported as mild in Her case. These are tiny white spots, that occur with time and are seen in a variety of conditions, including with normal aging, chronic hypertension, chronic headaches, especially migraine HAs, chronic diabetes, chronic hyperlipidemia. These are not strokes and no mass or lesion were seen which is reassuring. Again, there were no acute findings, such as a stroke, or mass or blood products. Also, compared to 2014, findings were deemed stable.  No further action is required on this test at this time, other than re-enforcing the importance of good blood pressure control, good cholesterol control, good blood sugar control, and weight management. Please remind patient to keep any upcoming appointments or tests and to call us with any interim questions, concerns, problems or updates. She is scheduled with Dr. Si Raider next month for cogn testing.   Thanks,  Star Age, MD, PhD

## 2016-04-28 ENCOUNTER — Encounter: Payer: Medicare Other | Admitting: Psychology

## 2016-06-11 ENCOUNTER — Ambulatory Visit: Payer: Medicare Other | Admitting: Adult Health

## 2016-06-12 ENCOUNTER — Telehealth: Payer: Self-pay | Admitting: Neurology

## 2016-06-12 NOTE — Telephone Encounter (Signed)
Error

## 2016-06-25 ENCOUNTER — Encounter: Payer: Self-pay | Admitting: Neurology

## 2016-06-25 ENCOUNTER — Ambulatory Visit (INDEPENDENT_AMBULATORY_CARE_PROVIDER_SITE_OTHER): Payer: Medicare Other | Admitting: Neurology

## 2016-06-25 ENCOUNTER — Telehealth: Payer: Self-pay | Admitting: Neurology

## 2016-06-25 VITALS — BP 132/88 | HR 76 | Resp 16 | Ht 66.0 in | Wt 189.0 lb

## 2016-06-25 DIAGNOSIS — G243 Spasmodic torticollis: Secondary | ICD-10-CM | POA: Diagnosis not present

## 2016-06-25 DIAGNOSIS — R498 Other voice and resonance disorders: Secondary | ICD-10-CM

## 2016-06-25 DIAGNOSIS — G252 Other specified forms of tremor: Secondary | ICD-10-CM

## 2016-06-25 MED ORDER — INCOBOTULINUMTOXINA 100 UNITS IM SOLR
100.0000 [IU] | INTRAMUSCULAR | Status: DC
  Administered 2016-06-25: 100 [IU] via INTRAMUSCULAR

## 2016-06-25 MED ORDER — INCOBOTULINUMTOXINA 50 UNITS IM SOLR
50.0000 [IU] | INTRAMUSCULAR | Status: DC
  Administered 2016-06-25: 50 [IU] via INTRAMUSCULAR

## 2016-06-25 NOTE — Telephone Encounter (Signed)
I scheduled her botox follow up.. I hope this is okay.

## 2016-06-25 NOTE — Progress Notes (Signed)
Alyssa Macdonald is a 71 y.o. female with an underlying medical history of hypertension, hyperlipidemia, reflux disease, trigger finger, tremor, endometriosis, and osteoarthritis, memory loss and cervical dystonia, who presents for her scheduled botulinum toxin injection. Her last injection was on 03/18/2016, at which time she reported having done well, no side effects, no new symptoms.   Today, 06/25/2016 (all dictated new, as well as above notes, some dictation done in note pad or Word, outside of chart, may appear as copied):  She reports doing well, no SEs, no new Sx. She is agreeable to rescheduling her appointment with neuropsychology.   O/E: BP 132/88   Pulse 76   Resp 16   Ht 5\' 6"  (1.676 m)   Wt 189 lb (85.7 kg)   BMI 30.51 kg/m  She has evidence of mild right torticollis and slight left shoulder elevation. 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 She also has a voice tremor, mild and a very mild L laterocollis, stable.   Written informed consent has previously been obtained and scanned into the patient's electronic chart. We will re-consent if the type of botulinum toxin or the dosing or the distribution changes in the future. The patient is informed that we will use the same consent for her recurrent, most likely 3 monthly injections. I have previously talked to the patient in detail about expectations, limitations, benefits as well as potential adverse effects of botulinum toxin injections. The patient understands that the side effects are not limited to the ones I discussed, which include mouth dryness, dryness of eyes, speech and swallowing difficulties, respiratory depression or problems breathing, weakness of muscles including more distant muscles than the ones injected, flu-like symptoms, myalgias, injection site reactions such as redness, itching, swelling, pain, and infection.   150 units of botulinum toxin type A were reconstituted using preservative-free normal saline to a  concentration of 10 units per 0.1 mL and drawn up into 1 mL tuberculin syringes. Lot number was [id number] and expiration date 10/2018 for the 100 unit vial, lot number [id number] and expiration date 08/2017 for the 50 unit vial.    The patient was situated in a chair, sitting comfortably. After preparing the areas with 70% isopropyl alcohol and using a 26 gauge 1/2 inch hollow lumen recording EMG needle, a total dose of 130 units of botulinum toxin type A in the form of Xeomin was injected into the muscles and the following distribution and quantities:   #1: 25 units in the left SCM #2: 15 units in the right SCM #3: 25 units in the left splenius capitis muscle #4: 10 units in the left semispinalis capitis muscle   #5: 10 units in the right semispinalis capitis muscle   #6: 25 units in the left levator scapulae muscle   #7: 20 units in the left upper trapezius muscle    EMG guidance was utilized for these injections with mild EMG activity noted, especially in the levator scapulae on the left, bilateral splenius capitis muscles, bilateral SCM's, bilateral semispinalis capitis muscles.     A dose of 20 out of a total dose of 150 units was discarded as unavoidable waste. The patient tolerated the procedure well without immediate complications. She was advised to make a followup appointment for repeat injections.     Previously (copied from previous notes for reference):    She had an injection under Dr. Krista Blue on 12/11/2015. I have seen her in the interim on 03/11/2016 as a new problem referral for memory  loss.   03/11/2016: She reports memory issues "all her life", denies issues driving, feels like she has issues with paying attention. Mother had AD, mother was around 48 when she passed, but she does not know. Her father lived to be 27s, had emphysema. She herself does not smoke, occasional wine, drinks 2 cups of coffee in AM. Has not slept well since her husband passed in 10/14. She is still on clonazepam. I  reviewed Dr. Merilyn Baba note from 12/12/15, and patient was advised to continue with propranolol, and stop the clonazepam, but continues to take it at the current dose of 0.5 mg qHS, has trazodone, but does not take it.  She endorses symptoms of depression since her husband passed away. She has had intermittent suicidal ideations, no current suicidal ideations, no plan.    She had a brain MRI with and without contrast on 10/20/2012 which I reviewed: IMPRESSION:  Slightly abnormal MRI scan of the brain showing mild changes of chronic microvascular ischemia.   She had an injection on 08/29/2015, at which time she reported doing well and she felt that the medication had worked quite well, no side effects were reported.   She had injection on 05/27/2015, at which time she reported doing fairly well. She had received her previous injection under Dr. Posey Pronto. We reviewe I reviewed her note at the time. She had an injection in January 2017 and follow-up with him in March 2017. She reported no side effects. She was agreeable with pursuing her previous injections that I did.    She had an injection with me on 10/24/2014, at which time she received a total dose of 130 units of Xeomin.    She received an injection under Dr. Posey Pronto with physical rehabilitation 02/15/2015 for total dose of 160 units. She was seen in follow-up in March 2017. I reviewed the office note and procedure note from Dr. Posey Pronto.    Her overall seventh injection was on 07/24/2014, at which time she received a total dose of 130 units of Xeomin.    Her 6th injection was on 04/19/2014, at which time she received a total dose of 130 units of Xeomin.    Her 5th injection was on 01/19/2014, at which time she was re-consented her as this was the first injection with me.    She previously followed with Dr. Janann Colonel and her last injection with him was on 10/11/2013 (4th), at which time she received a total dose of 135 units of botulinum toxin type A in the  form of Xeomin.    Her 3rd injection was on 07/04/2013 at which time she reported good benefit to her symptoms and no adverse effects. She felt it wore off after several weeks, approximately 2-3 weeks before she was due for another injection.    Her first injection was on 12/22/2013 and her second injection was on 03/24/2013. The condition has existed for more than 6 months, and pt does not have a diagnosis of ALS, Myasthenia Gravis or Lambert-Eaton Syndrome.

## 2016-06-25 NOTE — Patient Instructions (Addendum)
As discussed, botulinum toxin takes about 3-7 days to kick in. Please remember, this is not a pain shot, this is to gradually improve your symptoms. In some patients it takes up to 2-3 weeks to make a difference and it wears off with time. Sometimes it may wear off before it is time for the next injection. We still should wait till the next 3 monthly injection, because injecting too frequently may cause you to develop immunity to the botulinum toxin. We are looking for a reduction in your headache frequency and headache severity. Side effects to look out for are mouth dryness, dryness of the eyes, heaviness of your head or muscle weakness, rarely, speech or swallowing difficulties and very rarely breathing difficulties. Some people have transient neck pain or soreness which typically responds to over-the-counter anti-inflammatory medication and local heat application with a heat pad. If you think you have a severe reaction to the botulinum toxin, such as weakness, trouble speaking, trouble breathing, or trouble swallowing, you have to call 911 or have someone take you to the nearest emergency room. However, most people have no side effects from the injections. It is normal to have a little bit of redness and swelling around the injection sites which usually improves after a few hours. Rarely, there may be a bruise that improves on its own. Most side effects reported are very mild and resolve within 10-14 days. Please feel free to call us if you have any additional questions or concerns: [phone]. Please give [place] a call in about a month to report how you are doing after the botulinum toxin injections or to just give [place] an update. Call sooner, if you have any concerns, questions or feel that you have potential side effects. We may have to adjust the dose over time, depending on your results from this injection and your overall response over time to this medication.   Please call and reschedule your appointment  with Dr. Bonita Quin for neuropsychological evaluation: Her office number is [phone]

## 2016-06-30 NOTE — Telephone Encounter (Signed)
Noted, it is correct.

## 2016-07-14 ENCOUNTER — Encounter: Payer: Self-pay | Admitting: Psychology

## 2016-07-21 ENCOUNTER — Telehealth: Payer: Self-pay

## 2016-07-21 ENCOUNTER — Ambulatory Visit: Payer: Self-pay | Admitting: Adult Health

## 2016-07-21 NOTE — Telephone Encounter (Signed)
NO show at appointment today.

## 2016-07-22 ENCOUNTER — Encounter: Payer: Self-pay | Admitting: Adult Health

## 2016-09-24 ENCOUNTER — Ambulatory Visit (INDEPENDENT_AMBULATORY_CARE_PROVIDER_SITE_OTHER): Payer: Medicare Other | Admitting: Psychology

## 2016-09-24 ENCOUNTER — Encounter: Payer: Self-pay | Admitting: Psychology

## 2016-09-24 DIAGNOSIS — R413 Other amnesia: Secondary | ICD-10-CM

## 2016-09-24 NOTE — Progress Notes (Signed)
NEUROPSYCHOLOGICAL INTERVIEW (CPT: [id number])  Name: Alyssa Macdonald Date of Birth: 1945/08/09 Date of Interview: 09/24/2016  Reason for Referral:  Alyssa Macdonald is a 71 y.o. female who is referred for neuropsychological evaluation by Dr. Star Age of [facility] due to concerns about memory loss. This patient is accompanied in the office by her daughter, [name], who supplements the history.  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 She was seen by Dr. Rexene Alberts on 03/11/2016 and reported memory loss "all her life" and noted a family history of Alzheimer's disease in her mother who passed away in her 56s. MRI of the brain on 03/27/2016 reportedly revealed no acute findings, mild stable chronic microvascular ischemic changes,and mild cortical and callosal atrophy.  At today's visit, the patient denies having any concerns about her memory or cognitive functioning. Her daughter, on the other hand, reports she has seen cognitive decline over the past four years. She reported she first noticed this prior to the patient's husband passing away 3 years ago. She does feel it has worsened since he passed away. Her daughter reported that the patient frequently confuses dates/times, forgets recent conversations, repeats statements and questions, misplaces items, forgets appointments, and gets lost even when driving familiar routes. The patient states she has always gotten lost, since she first started driving at age 62. They deny any difficulties with or changes in communication, word finding, comprehension, or concentration/attention. The patient lives alone and manages all complex ADLs independently. She denies any difficulty managing her medications or finances/bills.   The patient reports stable, "pretty good" mood. Her daughter states the patient has always been extremely even-keel, but lately she has demonstrated a little more  agitation. The patient denies any significant anxiety or psychosocial stress. 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 She has participated in a grief counseling group program ([facility]). She has never had individual therapy or seen a psychiatrist. She has no history of diagnosed mental health condition. She denies past or present suicidal ideation or intention, even passive suicidal ideation or wishing she were dead, although I see in Dr. Guadelupe Sabin notes she has admitted to suicidal ideation in the past.  Physically, she reports stable functioning. Her tremor does not prevent her from performing most activities (except eating soup). She has not had any trouble with balance or walking. She does not have falls regularly but she did fall a few months ago (mechanical fall) and broke her foot.   She denies any sleep difficulty although Dr. Guadelupe Sabin notes mention difficulty sleeping since her husband passed away. The patient denies taking any medication (prescribed or OTC) for sleep. She states the clonazepam she takes is for her tremor.   She denies any history of hallucinations or illusions.    Social History: Born/Raised: [place] Education: 2 years of college Occupational history: Retired Network engineer Marital history: Widowed for 3 years. She was married 11 years when he passed away. Children: 1 daughter, 1 son. 6 grands (she said 5, her daughter had to correct her) Alcohol: Occasional glass of wine  Tobacco: Never SA: None   Medical History: Past Medical History:  Diagnosis Date  . Cervical dystonia   . Complication of anesthesia    slow to awaken  . Endometriosis   . Esophageal reflux   . Essential and other specified forms of tremor   . GERD (gastroesophageal reflux disease)   . Hyperlipidemia   .  Hypertension   . Hypopotassemia   . Osteoarthrosis involving, or with mention of more than one site, but not specified as generalized, site unspecified(715.80)     . Osteoarthrosis involving, or with mention of more than one site, but not specified as generalized, site unspecified(715.80)   . 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 Trigger finger       Current Medications:  Outpatient Encounter Prescriptions as of 09/24/2016  Medication Sig  . clonazePAM (KLONOPIN) 0.5 MG tablet Take 0.5 mg by mouth.  . incobotulinumtoxinA (XEOMIN) 100 units SOLR injection Inject into the muscle.  . multivitamin-iron-minerals-folic acid (CENTRUM) chewable tablet Chew 1 tablet by mouth daily.  Marland Kitchen omeprazole (PRILOSEC) 20 MG capsule Take 20 mg by mouth.  . propranolol ER (INDERAL LA) 60 MG 24 hr capsule TAKE ONE CAPSULE BY MOUTH ONE TIME DAILY  . traZODone (DESYREL) 50 MG tablet Take 50 mg by mouth.   Facility-Administered Encounter Medications as of 09/24/2016  Medication  . incobotulinumtoxinA (XEOMIN) 100 units injection 100 Units  . incobotulinumtoxinA (XEOMIN) 100 units injection 100 Units  . incobotulinumtoxinA (XEOMIN) 100 units injection 100 Units  . incobotulinumtoxinA (XEOMIN) 100 UNITS injection 150 Units  . incobotulinumtoxinA (XEOMIN) 50 units injection 150 Units  . incobotulinumtoxinA (XEOMIN) 50 units injection 50 Units  . incobotulinumtoxinA (XEOMIN) 50 units injection 50 Units  . incobotulinumtoxinA (XEOMIN) 50 units injection 50 Units   She reports she does not take propanolol or trazodone.   Behavioral Observations:   Appearance: Neatly, casually and appropriately dressed and groomed. UE and head tremor observed.  Gait: Ambulated independently, no gross abnormalities observed Speech: Fluent; mild dysarthria due to dystonia and/or tremor. No significant word finding difficulty. Thought process: Linear, goal directed Affect: Full, euthymic Interpersonal: Pleasant, appropriate   TESTING: There is medical necessity to proceed with neuropsychological assessment as the results will be used to aid in differential diagnosis and clinical  decision-making and to inform specific treatment recommendations. Per the patient, her daughter and medical records reviewed, there has been a change in cognitive functioning and a reasonable suspicion of neurocognitive disorder.  Following the clinical interview, the patient completed a full battery of neuropsychological testing with my psychometrician under my supervision.   PLAN: The patient will return to see me for a follow-up session at which time her test performances and my impressions and treatment recommendations will be reviewed in detail.  Full report to follow.

## 2016-09-24 NOTE — Progress Notes (Signed)
   Neuropsychology Note  Alyssa Macdonald came in today for 1 hour of neuropsychological testing with technician, Milana Kidney, BS, under the supervision of Dr. Macarthur Critchley. The patient did not appear overtly distressed by the testing session, per behavioral observation or via self-report to the technician. Rest breaks were offered. Alyssa Macdonald will return within 2 weeks for a feedback session with Dr. Si Raider at which time her test performances, clinical impressions and treatment recommendations will be reviewed in detail. The patient understands she can contact our office should she require our assistance before this time.  Full report to follow.

## 2016-09-29 ENCOUNTER — Ambulatory Visit: Payer: Medicare Other | Admitting: Neurology

## 2016-09-29 ENCOUNTER — Telehealth: Payer: Self-pay

## 2016-09-29 NOTE — Telephone Encounter (Signed)
Pt did not show for her xeomin injection today with Dr. Rexene Alberts.

## 2016-10-12 ENCOUNTER — Ambulatory Visit (INDEPENDENT_AMBULATORY_CARE_PROVIDER_SITE_OTHER): Payer: Medicare Other | Admitting: Neurology

## 2016-10-12 ENCOUNTER — Encounter: Payer: Self-pay | Admitting: Neurology

## 2016-10-12 ENCOUNTER — Telehealth: Payer: Self-pay | Admitting: Neurology

## 2016-10-12 VITALS — BP 160/100 | HR 72 | Ht 68.0 in | Wt 188.0 lb

## 2016-10-12 DIAGNOSIS — G252 Other specified forms of tremor: Secondary | ICD-10-CM

## 2016-10-12 DIAGNOSIS — G243 Spasmodic torticollis: Secondary | ICD-10-CM | POA: Diagnosis not present

## 2016-10-12 MED ORDER — INCOBOTULINUMTOXINA 100 UNITS IM SOLR
100.0000 [IU] | INTRAMUSCULAR | Status: DC
  Administered 2016-10-12: 100 [IU] via INTRAMUSCULAR

## 2016-10-12 MED ORDER — INCOBOTULINUMTOXINA 50 UNITS IM SOLR
50.0000 [IU] | INTRAMUSCULAR | Status: DC
  Administered 2016-10-12: 50 [IU] via INTRAMUSCULAR

## 2016-10-12 NOTE — Progress Notes (Signed)
NEUROPSYCHOLOGICAL EVALUATION   Name:    Alyssa Macdonald  Date of Birth:   07-Jul-1945 Date of Interview:  09/24/2016 Date of Testing:  09/24/2016   Date of Feedback:  10/13/2016       Background Information:  Reason for Referral:  Alyssa Macdonald is a 71 y.o. female referred by Dr. Star Age of [facility] to assess her current level of cognitive functioning and assist in differential diagnosis. The current evaluation consisted of a review of available medical records, an interview with the patient and her daughter, [name], and the completion of a neuropsychological testing battery. Informed consent was obtained.  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 She was seen by Dr. Rexene Alberts on 03/11/2016 and reported memory loss "all her life" and noted a family history of Alzheimer's disease in her mother who passed away in her 52s. MRI of the brain on 03/27/2016 reportedly revealed no acute findings, mild stable chronic microvascular ischemic changes,and mild cortical and callosal atrophy.  At today's visit, the patient denies having any concerns about her memory or cognitive functioning. Her daughter, on the other hand, reports she has seen cognitive decline over the past four years. She reported she first noticed this prior to the patient's husband passing away 3 years ago. She does feel it has worsened since he passed away. Her daughter reported that the patient frequently confuses dates/times, forgets recent conversations, repeats statements and questions, misplaces items, forgets appointments, and gets lost even when driving familiar routes. The patient states she has always gotten lost, since she first started driving at age 58. They deny any difficulties with or changes in communication, word finding, comprehension, or concentration/attention. The patient lives alone and manages all complex ADLs independently. She denies any difficulty managing her  medications or finances/bills.   The patient reports stable, "pretty good" mood. Her daughter states the patient has always been extremely even-keel, but lately she has demonstrated a little more agitation. The patient denies any significant anxiety or psychosocial stress. 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 She has participated in a grief counseling group program ([facility]). She has never had individual therapy or seen a psychiatrist. She has no history of diagnosed mental health condition. She denies past or present suicidal ideation or intention, even passive suicidal ideation or wishing she were dead, although I see in Dr. Guadelupe Sabin notes she has admitted to suicidal ideation in the past.  Physically, she reports stable functioning. Her tremor does not prevent her from performing most activities (except eating soup). She has not had any trouble with balance or walking. She does not have falls regularly but she did fall a few months ago (mechanical fall) and broke her foot.   She denies any sleep difficulty although Dr. Guadelupe Sabin notes mention difficulty sleeping since her husband passed away. The patient denies taking any medication (prescribed or OTC) for sleep. She states the clonazepam she takes is for her tremor.   She denies any history of hallucinations or illusions.    Social History: Born/Raised: [place] Education: 2 years of college Occupational history: Retired Network engineer Marital history: Widowed for 3 years. She was married 25 years when he passed away. Children: 1 daughter, 1 son. 6 grands (she said 5, her daughter had to correct her) Alcohol: Occasional glass of wine  Tobacco: Never SA: None   Medical History:  Past Medical History:  Diagnosis Date  .  Cervical dystonia   . Complication of anesthesia    slow to awaken  . Endometriosis   . Esophageal reflux   . Essential and other specified forms of tremor   . GERD (gastroesophageal  reflux disease)   . Hyperlipidemia   . Hypertension   . Hypopotassemia   . Osteoarthrosis involving, or with mention of more than one site, but not specified as generalized, site unspecified(715.80)   . Osteoarthrosis involving, or with mention of more than one site, but not specified as generalized, site unspecified(715.80)   . 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 Trigger finger     Current medications:   clonazePAM 0.5 MG tablet Commonly known as:  KLONOPIN Take 0.5 mg by mouth.   incobotulinumtoxinA 100 units Solr injection Commonly known as:  XEOMIN Inject into the muscle.   multivitamin-iron-minerals-folic acid chewable tablet Chew 1 tablet by mouth daily.          Current Examination:  Behavioral Observations:  Appearance: Neatly, casually and appropriately dressed and groomed. UE and head tremor observed.  Gait: Ambulated independently, no gross abnormalities observed Speech: Fluent; mild dysarthria due to dystonia and/or tremor. No significant word finding difficulty. Thought process: Linear, goal directed Affect: Full, euthymic Interpersonal: Pleasant, appropriate Orientation: Oriented to person, place and most aspects of time (provided incorrect date but correct month, day and year). Unable to name current President or his predecessor.   Tests Administered: . Test of Premorbid Functioning (TOPF) . Wechsler Adult Intelligence Scale-Fourth Edition (WAIS-IV): Similarities, Music therapist, Coding and Digit Span subtests . Wechsler Memory Scale-Fourth Edition (WMS-IV) Older Adult Version (ages 62-90): Logical Memory I, II and Recognition subtests  . [employer] Verbal Learning Test - 2nd Edition (CVLT-2) Short Form . Repeatable Battery for the Assessment of Neuropsychological Status (RBANS) Form A:  Figure Copy and Recall subtests and Semantic Fluency subtest . [place] Diagnostic Aphasia Examination: Complex Ideational Material subtest . Controlled Oral  Word Association Test (COWAT) . Trail Making Test A and B . Clock drawing test . Symbol Digit Modalities Test (SDMT) . [place] (BNT) . Geriatric Depression Scale (GDS) 15 Item . Generalized Anxiety Disorder - 7 item screener (GAD-7)   Test Results: Note: Standardized scores are presented only for use by appropriately trained professionals and to allow for any future test-retest comparison. These scores should not be interpreted without consideration of all the information that is contained in the rest of the report. The most recent standardization samples from the test publisher or other sources were used whenever possible to derive standard scores; scores were corrected for age, gender, ethnicity and education when available.   Test Scores:  Test Name Raw Score Standardized Score Descriptor  TOPF 52/70 SS= 109 Average  WAIS-IV Subtests     Similarities 21/36 ss= 9 Average  Block Design 30/66 ss= 10 Average  Coding 41/135 ss= 8 Average  Digit Span Forward 12/16 ss= 13 High average  Digit Span Backward 12/16 ss= 15 Superior  WMS-IV Subtests     LM I 24/53 ss= 7 Low average  LM II 0/39 ss= 1 Impaired  LM II Recognition 14/23 Cum %: 3-9 Impaired  SDMT - Oral 36/110 Z= -1.2 Low average  RBANS Subtests     Figure Copy 19/20 Z= 0.7 High average  Figure Recall 2/20 Z= -2.5 Impaired  Semantic Fluency 16 Z= -0.7 Average  CVLT-II Scores     Trial 1 5/9 Z= -0.5 Average  Trial 4 6/9 Z= -1.5 Borderline  Trials 1-4 total 23/36 T= 43 Average  SD Free Recall 5/9 Z= -1 Low average  LD Free Recall 0/9 Z= -2 Impaired  LD Cued Recall 2/9 Z= -2.5 Impaired  Total Intrusions, all recall trials 12 Z=-5 Severely impaired  Recognition Hits 9/9 Z= 0 Average  Recognition False Positives 5 Z=-2 Impaired  Forced Choice Recognition 9/9  WNL  BDAE Complex Ideational Material 11/12  WNL  BNT 49/60 T= 45 Average  COWAT-FAS 21 T= 33 Borderline  COWAT-Animals 9 T= 28 Impaired  Trail Making Test  A  31" 0 errors T= 57 High average  Trail Making Test B  58" 0 errors T= 57 High average  Clock Drawing   WNL  GDS-15 1/15  WNL  GAD-7 0/21  WNL      Description of Test Results:  Premorbid verbal intellectual abilities were estimated to have been within the average to high average range based on a test of word reading. Psychomotor processing speed was average. Auditory attention and working memory were high average to superior. Visual-spatial construction was average to high average. Language abilities were somewhat variable. Specifically, confrontation naming was average, and semantic verbal fluency was impaired for animals but average for fruits/vegetables. Auditory comprehension of complex ideational material was within normal limits. With regard to verbal memory, encoding and acquisition of non-contextual information (i.e., word list) was average across four learning trials but with relatively flat learning curve. After a brief distracter task, free recall was low average (5/9 items recalled). After a delay, free recall was impaired (0/9 items recalled). Cued recall was impaired (2/9 items recalled). She demonstrated an elevated number of intrusion errors across all recall trials. Performance on a yes/no recognition task was impaired due to increased number of false positives. On another verbal memory test, encoding and acquisition of contextual auditory information (i.e., short stories) was low average. After a delay, free recall was impaired (no features of either story were recalled). Performance on a yes/no recognition task was impaired. With regard to non-verbal memory, delayed free recall of visual information was impaired. Executive functioning was variable but mostly intact. Mental flexibility and set-shifting were high average on Trails B. Verbal fluency with phonemic search restrictions was borderline impaired. Verbal abstract reasoning was average. Performance on a clock drawing task  was normal. On self-report screening measures of mood, the patient's responses were not indicative of clinically significant depression or anxiety at the present time.    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 Results of the current cognitive evaluation reveal impaired verbal and non-verbal memory retrieval and consolidation as well as reduced verbal fluency. Additionally, there is evidence that her cognitive deficits are interfering with her ability to manage complex tasks, such as driving (forgetting familiar routes) and managing appointments. As such, diagnostic criteria for a dementia syndrome are met.   The patient's cognitive profile is suggestive of medial-temporal lobe involvement. Alzheimer's disease is the most likely etiology, given her cognitive profile and clinical features. The patient's stage of dementia can be characterized as mild. She retains many areas of strength on cognitive testing, including in processing speed, attention, visual-spatial construction and executive functioning.  There is no evidence of primary psychiatric disorder. Her current mood appears euthymic, and there is no behavioral disturbance.   Recommendations:  1. Treatment/Medication: The patient may be an appropriate candidate for cholinesterase inhibitor therapy. She will follow up with Dr. Rexene Alberts about this.  2. Complex ADLs: The patient would benefit from assistance with managing  her appointments (e.g., oversight of her calendar/appointments and cueing/reminders). Monitoring of her medication management is also recommended. She performed well on a cognitive test highly correlated with driving ability, but her memory impairment puts her at risk for becoming lost. She should limit her driving to local/familiar locations during the day. Oversight of her management of finances is also recommended to ensure errors are not made.   3. Education/Support: The patient's family  can seek additional education and support from [employer] and Tax adviser.  4. Quality of Life/Optimization of Cognitive Functioning: The patient should continue to participate in activities which provide mental stimulation, safe cardiovascular exercise, and social interaction. She appears to be an ideal candidate for an assisted living community where she would have more support as well as more opportunities for structured social activities.  5. Neuropsychological re-assessment in 1-2 years could be considered in order to monitor cognitive status, track progression of symptoms and further assist with treatment planning.   Feedback to Patient: Alyssa Macdonald and her son and son-in-law returned for a feedback appointment on 10/13/2016 to review the results of her neuropsychological evaluation with this provider. 35 minutes face-to-face time was spent reviewing her test results, my impressions and my recommendations as detailed above.    Total time spent on this patient's case: [id number] unit for interview with psychologist; [phone] units of testing by psychometrician under psychologist's supervision; [phone] units for medical record review, scoring of neuropsychological tests, interpretation of test results, preparation of this report, and review of results to the patient by psychologist.      Thank you for your referral of Alyssa Macdonald. Please feel free to contact me if you have any questions or concerns regarding this report.

## 2016-10-12 NOTE — Progress Notes (Signed)
Alyssa Macdonald is a 71 y.o. female with an underlying medical history of hypertension, hyperlipidemia, reflux disease, trigger finger, tremor, endometriosis, and osteoarthritis, memory loss and cervical dystonia, who presents for her repeat botulinum toxin injections for her diagnosis of cervical dystonia. She no showed for an appointment on 09/29/2016. Her last injection was on 06/25/2016, at which time she reported doing well, reported no side effects or new symptoms. She was advised to reschedule her appointment with neuropsychology. She had an appointment in the interim on 09/24/2016 with a full report pending and also follow-up pending as I understand with Dr. Bonita Quin.   Today, 10/12/2016 (all dictated new, as well as above notes, some dictation done in note pad or Word, outside of chart, may appear as copied):  She reports doing well, no SEs, no new Sx. No new Sx.    O/E: BP (!) 160/100   Pulse 72   Ht 5\' 8"  (1.727 m)   Wt 188 lb (85.3 kg)   BMI 28.59 kg/m :    She has evidence of mild right torticollis and slight left shoulder elevation. 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 She also has a voice tremor, mild and a very mild L laterocollis, stable.   Written informed consent has previously been obtained and scanned into the patient's electronic chart. We will re-consent if the type of botulinum toxin or the dosing or the distribution changes in the future. The patient is informed that we will use the same consent for her recurrent, most likely 3 monthly injections. I have previously talked to the patient in detail about expectations, limitations, benefits as well as potential adverse effects of botulinum toxin injections. The patient understands that the side effects are not limited to the ones I discussed, which include mouth dryness, dryness of eyes, speech and swallowing difficulties, respiratory depression or problems breathing, weakness of muscles including more distant muscles  than the ones injected, flu-like symptoms, myalgias, injection site reactions such as redness, itching, swelling, pain, and infection.   150 units of botulinum toxin type A were reconstituted using preservative-free normal saline to a concentration of 10 units per 0.1 mL and drawn up into 1 mL tuberculin syringes. Lot number was [id number] and expiration date 10/2018 for the 100 unit vial, lot number [id number] and expiration date 05/2018 for the 50 unit vial.    The patient was situated in a chair, sitting comfortably. After preparing the areas with 70% isopropyl alcohol and using a 26 gauge 1/2 inch hollow lumen recording EMG needle, a total dose of 130 units of botulinum toxin type A in the form of Xeomin was injected into the muscles and the following distribution and quantities:   #1: 25 units in the left SCM #2: 15 units in the right SCM #3: 25 units in the left splenius capitis muscle #4: 10 units in the left semispinalis capitis muscle   #5: 10 units in the right semispinalis capitis muscle   #6: 25 units in the left levator scapulae muscle   #7: 20 units in the left upper trapezius muscle    EMG guidance was utilized for these injections with mild EMG activity noted, especially in the levator scapulae on the left, bilateral splenius capitis muscles, bilateral SCM's, bilateral semispinalis capitis muscles.     A dose of 20 out of a total dose of 150 units was discarded as unavoidable waste. The patient tolerated the procedure well without immediate complications. She was advised to make a followup appointment  for repeat injections.     Previously (copied from previous notes for reference):   She had an injection on 03/18/2016, at which time she did well with her injection, reported no side effects.   She had an injection under Dr. Krista Blue on 12/11/2015. I have seen her in the interim on 03/11/2016 as a new problem referral for memory loss.    03/11/2016: She reports memory issues "all her life",  denies issues driving, feels like she has issues with paying attention. Mother had AD, mother was around 52 when she passed, but she does not know. Her father lived to be 9s, had emphysema. She herself does not smoke, occasional wine, drinks 2 cups of coffee in AM. Has not slept well since her husband passed in 10/14. She is still on clonazepam. I reviewed Dr. Merilyn Baba note from 12/12/15, and patient was advised to continue with propranolol, and stop the clonazepam, but continues to take it at the current dose of 0.5 mg qHS, has trazodone, but does not take it.  She endorses symptoms of depression since her husband passed away. She has had intermittent suicidal ideations, no current suicidal ideations, no plan.    She had a brain MRI with and without contrast on 10/20/2012 which I reviewed: IMPRESSION:  Slightly abnormal MRI scan of the brain showing mild changes of chronic microvascular ischemia.   She had an injection on 08/29/2015, at which time she reported doing well and she felt that the medication had worked quite well, no side effects were reported.   She had injection on 05/27/2015, at which time she reported doing fairly well. She had received her previous injection under Dr. Posey Pronto. We reviewe I reviewed her note at the time. She had an injection in January 2017 and follow-up with him in March 2017. She reported no side effects. She was agreeable with pursuing her previous injections that I did.    She had an injection with me on 10/24/2014, at which time she received a total dose of 130 units of Xeomin.    She received an injection under Dr. Posey Pronto with physical rehabilitation 02/15/2015 for total dose of 160 units. She was seen in follow-up in March 2017. I reviewed the office note and procedure note from Dr. Posey Pronto.    Her overall seventh injection was on 07/24/2014, at which time she received a total dose of 130 units of Xeomin.    Her 6th injection was on 04/19/2014, at which time she  received a total dose of 130 units of Xeomin.    Her 5th injection was on 01/19/2014, at which time she was re-consented her as this was the first injection with me.    She previously followed with Dr. Janann Colonel and her last injection with him was on 10/11/2013 (4th), at which time she received a total dose of 135 units of botulinum toxin type A in the form of Xeomin.    Her 3rd injection was on 07/04/2013 at which time she reported good benefit to her symptoms and no adverse effects. She felt it wore off after several weeks, approximately 2-3 weeks before she was due for another injection.    Her first injection was on 12/22/2013 and her second injection was on 03/24/2013. The condition has existed for more than 6 months, and pt does not have a diagnosis of ALS, Myasthenia Gravis or Lambert-Eaton Syndrome.

## 2016-10-12 NOTE — Telephone Encounter (Signed)
Per Dr. Rexene Alberts: Return in about 3 months (around 01/11/2017), or repeat xeomin inj B/B.

## 2016-10-12 NOTE — Patient Instructions (Signed)
As discussed, botulinum toxin takes about 3-7 days to kick in. Please remember, this is not a pain shot, this is to gradually improve your symptoms. In some patients it takes up to 2-3 weeks to make a difference and it wears off with time. Sometimes it may wear off before it is time for the next injection. We still should wait till the next 3 monthly injection, because injecting too frequently may cause you to develop immunity to the botulinum toxin. We are looking for a reduction in your headache frequency and headache severity. Side effects to look out for are mouth dryness, dryness of the eyes, heaviness of your head or muscle weakness, rarely, speech or swallowing difficulties and very rarely breathing difficulties. Some people have transient neck pain or soreness which typically responds to over-the-counter anti-inflammatory medication and local heat application with a heat pad. If you think you have a severe reaction to the botulinum toxin, such as weakness, trouble speaking, trouble breathing, or trouble swallowing, you have to call 911 or have someone take you to the nearest emergency room. However, most people have no side effects from the injections. It is normal to have a little bit of redness and swelling around the injection sites which usually improves after a few hours. Rarely, there may be a bruise that improves on its own. Most side effects reported are very mild and resolve within 10-14 days. Please feel free to call us if you have any additional questions or concerns: [phone].

## 2016-10-13 ENCOUNTER — Telehealth: Payer: Self-pay | Admitting: Neurology

## 2016-10-13 ENCOUNTER — Ambulatory Visit (INDEPENDENT_AMBULATORY_CARE_PROVIDER_SITE_OTHER): Payer: Medicare Other | Admitting: Psychology

## 2016-10-13 ENCOUNTER — Encounter: Payer: Self-pay | Admitting: Psychology

## 2016-10-13 DIAGNOSIS — G301 Alzheimer's disease with late onset: Secondary | ICD-10-CM

## 2016-10-13 DIAGNOSIS — F0280 Dementia in other diseases classified elsewhere without behavioral disturbance: Secondary | ICD-10-CM

## 2016-10-13 NOTE — Telephone Encounter (Signed)
Please call patient to schedule FU for memory loss, to discuss recent test results with Dr. Bonita Quin and possible medication.

## 2016-10-13 NOTE — Patient Instructions (Signed)
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 Results of the current cognitive evaluation reveal impaired verbal and non-verbal memory retrieval and consolidation as well as reduced verbal fluency. Additionally, there is evidence that her cognitive deficits are interfering with her ability to manage complex tasks, such as driving (forgetting familiar routes) and managing appointments. As such, diagnostic criteria for a dementia syndrome are met.   The patient's cognitive profile is suggestive of medial-temporal lobe involvement. Alzheimer's disease is the most likely etiology, given her cognitive profile and clinical features. The patient's stage of dementia can be characterized as mild. She retains many areas of strength on cognitive testing, including in processing speed, attention, visual-spatial construction and executive functioning.  There is no evidence of primary psychiatric disorder. Her current mood appears euthymic, and there is no behavioral disturbance.   Recommendations:  1. Treatment/Medication: The patient may be an appropriate candidate for cholinesterase inhibitor therapy. She will follow up with Dr. Rexene Alberts about this.  2. Complex ADLs: The patient would benefit from assistance with managing her appointments (e.g., oversight of her calendar/appointments and cueing/reminders). Monitoring of her medication management is also recommended. She performed well on a cognitive test highly correlated with driving ability, but her memory impairment puts her at risk for becoming lost. She should limit her driving to local/familiar locations during the day. Oversight of her management of finances is also recommended to ensure errors are not made.   3. Education/Support: The patient's family can seek additional education and support from [employer] and Tax adviser.  4. Quality of Life/Optimization of Cognitive Functioning: The  patient should continue to participate in activities which provide mental stimulation, safe cardiovascular exercise, and social interaction. She appears to be an ideal candidate for an assisted living community where she would have more support as well as more opportunities for structured social activities.  5. Neuropsychological re-assessment in 1-2 years could be considered in order to monitor cognitive status, track progression of symptoms and further assist with treatment planning.

## 2016-10-14 NOTE — Telephone Encounter (Signed)
I called pt to set up an appt for her to discuss memory and results of Dr. Marcia Brash appts. No answer, left a message asking her to call me back. Dr. Rexene Alberts has an opening 10/15/16 at 4:00pm if pt can make this appt, please offer it to her if she calls back.

## 2016-10-14 NOTE — Telephone Encounter (Signed)
I called the patient to schedule an apt but there seemed to be some trouble with her phone and she could not hear me so the call was disconnected. Will try back at a later time.

## 2016-10-14 NOTE — Telephone Encounter (Signed)
Pt returned RN's call. appt was scheduled for 9/13 @ 4pm.  Juluis Rainier

## 2016-10-15 ENCOUNTER — Ambulatory Visit: Payer: Medicare Other | Admitting: Neurology

## 2016-10-15 NOTE — Telephone Encounter (Signed)
Pt has called back and is asking that Andee Poles calls her back

## 2016-10-15 NOTE — Telephone Encounter (Signed)
Pt did not show for her appt on 10/15/16 with Dr. Rexene Alberts.

## 2016-10-19 NOTE — Telephone Encounter (Signed)
I called and left a VM asking the patient to call me back.

## 2016-10-19 NOTE — Telephone Encounter (Signed)
Pt called today, she forgot her appt on 9/13. I scheduled the original appt with the pt on 9/12. Today I asked her if anyone is going to bring her to the next appt, said she always drives herself. I said I understand but you forgot your appt, what if you get lost. She said I always drive myself and I have a GPS, this is a new thing with me. This is the 3rd no show this year, I advised her I could not r/s the appt. The RN will call to discuss with her. She can be reached at [phone]

## 2016-10-19 NOTE — Telephone Encounter (Signed)
Patient has called 2 times since original call to state she will pay for the  Visits she has missed. FYI

## 2016-10-19 NOTE — Telephone Encounter (Signed)
Please reschedule, but patient has to bring family member for this appointment.

## 2016-10-19 NOTE — Telephone Encounter (Signed)
I called pt. I explained that she has missed 4 appts with [place] (She has no showed 4 times-12/05/15, 07/21/2016, 09/29/2016 and 10/15/2016) but that Dr. Rexene Alberts is willing to see her again as long as she brings a family member with her. Pt is insistent that she has not missed any appts but that she will pay for the ones that she has missed. I asked to to bring a family member with her to any further appts at [facility] and an appt was made for 12/08/16 at 10:30am. Pt verbalized understanding of appt date and time. Pt asked to be called for a sooner appt if one comes available.

## 2016-10-20 NOTE — Telephone Encounter (Signed)
Pt presented to the lobby this morning to pay for her missed appts. Pt was asking to get the "shot in my head for my alzheimer's". I spoke with pt, reminded her that I spoke with her yesterday and she needs to bring a family member to her appt on 12/08/16. Pt is still insistent that Dr. Si Raider told her that she needs a shot in her head to treat her alzheimer's. She seems to vaguely recall our conversation from yesterday and says that she can bring a friend to her appt but most likely not a family member, and wants a sooner appt. I made an appt with pt for 10/22/16 at 1:30pm and pt wrote this appt down on an appt card, and I verified with her this appt date and time.  I spoke with Dr. Rexene Alberts, she is requesting that a family member come with the pt to her appt, it cannot be just a friend.  I called pt back and explained this again to her, that she needs a family member to come with her to her appt on Thursday. Pt reports that her daughter has 5 kids and it will be hard for her daughter to do this, but she will speak with her daughter and let us know if she can make the appt on Thursday or not. I advised her to please give [place] 24 hour notice if a reschedule is needed. Pt verbalized understanding.

## 2016-10-22 ENCOUNTER — Ambulatory Visit (INDEPENDENT_AMBULATORY_CARE_PROVIDER_SITE_OTHER): Payer: Medicare Other | Admitting: Neurology

## 2016-10-22 ENCOUNTER — Encounter: Payer: Self-pay | Admitting: Neurology

## 2016-10-22 VITALS — BP 142/74 | HR 70 | Ht 67.0 in | Wt 187.0 lb

## 2016-10-22 DIAGNOSIS — G309 Alzheimer's disease, unspecified: Secondary | ICD-10-CM | POA: Diagnosis not present

## 2016-10-22 DIAGNOSIS — F0280 Dementia in other diseases classified elsewhere without behavioral disturbance: Secondary | ICD-10-CM | POA: Diagnosis not present

## 2016-10-22 MED ORDER — DONEPEZIL HCL 5 MG PO TABS: 5.0000 mg | ORAL_TABLET | Freq: Every day | ORAL | 5 refills | Status: DC

## 2016-10-22 NOTE — Patient Instructions (Addendum)
As discussed, for your memory, we will start you on low-dose Aricept generic:    Aricept (generic name: donepezil) 5 mg: take one pill each evening. Common side effects may include dry eyes, dry mouth, confusion, low pulse, low blood pressure, also GI related side effects (nausea, vomiting, diarrhea, constipation), headaches; rare side effects may include hallucinations and seizures.   We will do a three-month follow-up with one of our nurse practitioners to see how you are doing and to potentially increase your memory medication to 10 mg daily at the time.  I am a little worried about your driving: Please have your family monitor it and I would suggest at this point only local roads, familiar routes, no nighttime and no highway driving, no long distance or new locations by yourself.

## 2016-10-22 NOTE — Progress Notes (Signed)
Subjective:    Patient ID: Alyssa Macdonald is a 71 y.o. female.  HPI     Interim history:   Alyssa Macdonald is a 71 y.o. female with an underlying medical history of hypertension, hyperlipidemia, reflux disease, trigger finger, tremor, endometriosis, and osteoarthritis, memory loss and cervical dystonia, who presents for follow-up consultation of her memory loss after recent neuropsychological testing. The patient is accompanied by her son Legrand Como today, of note, she missed an appointment on 10/15/2016. I last saw her on 10/12/2016, at which time she received her botulinum toxin injection for her cervical dystonia and dystonic head tremor. She had done well with her previous injection.  I saw her 03/11/2016 for memory evaluation. At the time, her MOCA score was 22/30, CDT: 4/5, category fluency 11/min. (She missed 5 points on remote recall, one point on repetition, one point on clock drawing, one point on orientation for exact dates). I suggested we proceed with brain MRI testing and neuropsychological evaluation.   She had a brain MRI without contrast on 03/27/2016 and I reviewed the results: IMPRESSION:  This MRI of the brain without contrast shows the following: 1.   Mild stable chronic microvascular ischemic changes. 2.   Mild stable cortical atrophy and callosal atrophy. 3.   There are no acute findings.  We called her with her test results.  Today, 10/15/2016 (all dictated new, as well as above notes, some dictation done in note pad or Word, outside of chart, may appear as copied):  She reports doing okay. patient's mother had memory loss, died at 65. Mother was eventually in a memory care unit. Father was in his 10s when he passed.   She limits her driving. Her daughter lives about 2 miles away, son in [place]. He reports that her memory loss may have been ongoing for about 5 years. He has not noticed any personality changes, disturbing depressive moods are mood swings or anxiety issues  with her.  She had recent neuropsychological evaluation with Dr. 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 Results of the current cognitive evaluation reveal impaired verbal and non-verbal memory retrieval and consolidation as well as reduced verbal fluency. Additionally, there is evidence that her cognitive deficits are interfering with her ability to manage complex tasks, such as driving (forgetting familiar routes) and managing appointments. As such, diagnostic criteria for a dementia syndrome are met.    The patient's cognitive profile is suggestive of medial-temporal lobe involvement. Alzheimer's disease is the most likely etiology, given her cognitive profile and clinical features. The patient's stage of dementia can be characterized as mild. She retains many areas of strength on cognitive testing, including in processing speed, attention, visual-spatial construction and executive functioning.   There is no evidence of primary psychiatric disorder. Her current mood appears euthymic, and there is no behavioral disturbance.     Recommendations:   1. Treatment/Medication: The patient may be an appropriate candidate for cholinesterase inhibitor therapy. She will follow up with Dr. Rexene Alberts about this.   2. Complex ADLs: The patient would benefit from assistance with managing her appointments (e.g., oversight of her calendar/appointments and cueing/reminders). Monitoring of her medication management is also recommended. She performed well on a cognitive test highly correlated with driving ability, but her memory impairment puts her at risk for becoming lost. She should limit her driving to local/familiar locations during the day. Oversight of her  management of finances is also recommended to ensure errors are not made.    3. Education/Support: The  patient's family can seek additional education and support from [employer] and Tax adviser.   4. Quality of Life/Optimization of Cognitive Functioning: The patient should continue to participate in activities which provide mental stimulation, safe cardiovascular exercise, and social interaction. She appears to be an ideal candidate for an assisted living community where she would have more support as well as more opportunities for structured social activities.   5. Neuropsychological re-assessment in 1-2 years could be considered in order to monitor cognitive status, track progression of symptoms and further assist with treatment planning.  >>    The patient's allergies, current medications, family history, past medical history, past social history, past surgical history and problem list were reviewed and updated as appropriate.    Previously (copied from previous notes for reference):   She no showed for an appointment on 09/29/2016. Her last injection was on 06/25/2016, at which time she reported doing well, reported no side effects or new symptoms. She was advised to reschedule her appointment with neuropsychology. She had an appointment in the interim on 09/24/2016 with a full report pending and also follow-up pending as I understand with Dr. Bonita Quin.      She had an injection on 03/18/2016, at which time she did well with her injection, reported no side effects.    She had an injection under Dr. Krista Blue on 12/11/2015. I have seen her in the interim on 03/11/2016 as a new problem referral for memory loss.    03/11/2016: She reports memory issues "all her life", denies issues driving, feels like she has issues with paying attention. Mother had AD, mother was around 80 when she passed, but she does not know. Her father lived to be 29s, had emphysema. She herself does not smoke, occasional wine, drinks 2 cups of coffee in AM. Has not slept well since her husband  passed in 10/14. She is still on clonazepam. I reviewed Dr. Merilyn Baba note from 12/12/15, and patient was advised to continue with propranolol, and stop the clonazepam, but continues to take it at the current dose of 0.5 mg qHS, has trazodone, but does not take it.  She endorses symptoms of depression since her husband passed away. She has had intermittent suicidal ideations, no current suicidal ideations, no plan.    She had a brain MRI with and without contrast on 10/20/2012 which I reviewed: IMPRESSION:  Slightly abnormal MRI scan of the brain showing mild changes of chronic microvascular ischemia.   She had an injection on 08/29/2015, at which time she reported doing well and she felt that the medication had worked quite well, no side effects were reported.   She had injection on 05/27/2015, at which time she reported doing fairly well. She had received her previous injection under Dr. Posey Pronto. We reviewe I reviewed her note at the time. She had an injection in January 2017 and follow-up with him in March 2017. She reported no side effects. She was agreeable with pursuing her previous injections that I did.    She had an injection with me on 10/24/2014, at which time she received a total dose of 130 units of Xeomin.    She received an injection under Dr. Posey Pronto with physical rehabilitation 02/15/2015 for total dose of 160 units. She was seen in follow-up in March 2017. I reviewed the office note and procedure note from Dr. Posey Pronto.  Her overall seventh injection was on 07/24/2014, at which time she received a total dose of 130 units of Xeomin.    Her 6th injection was on 04/19/2014, at which time she received a total dose of 130 units of Xeomin.    Her 5th injection was on 01/19/2014, at which time she was re-consented her as this was the first injection with me.    She previously followed with Dr. Janann Colonel and her last injection with him was on 10/11/2013 (4th), at which time she received a total dose  of 135 units of botulinum toxin type A in the form of Xeomin.    Her 3rd injection was on 07/04/2013 at which time she reported good benefit to her symptoms and no adverse effects. She felt it wore off after several weeks, approximately 2-3 weeks before she was due for another injection.    Her first injection was on 12/22/2013 and her second injection was on 03/24/2013. The condition has existed for more than 6 months, and pt does not have a diagnosis of ALS, Myasthenia Gravis or Lambert-Eaton Syndrome.  Her Past Medical History Is Significant For: Past Medical History:  Diagnosis Date  . Cervical dystonia   . Complication of anesthesia    slow to awaken  . Endometriosis   . Esophageal reflux   . Essential and other specified forms of tremor   . GERD (gastroesophageal reflux disease)   . Hyperlipidemia   . Hypertension   . Hypopotassemia   . Osteoarthrosis involving, or with mention of more than one site, but not specified as generalized, site unspecified(715.80)   . Osteoarthrosis involving, or with mention of more than one site, but not specified as generalized, site unspecified(715.80)   . 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 Trigger finger     Her Past Surgical History Is Significant For: Past Surgical History:  Procedure Laterality Date  . ABDOMINAL HYSTERECTOMY    . APPENDECTOMY    . CHOLECYSTECTOMY    . COLON SURGERY     bowel resection  . FOOT SURGERY    . JOINT REPLACEMENT     bilateral  . TONSILLECTOMY    . TOTAL KNEE ARTHROPLASTY     Bilateral  . TRIGGER FINGER RELEASE  02/01/2012   Procedure: RELEASE TRIGGER FINGER/A-1 PULLEY;  Surgeon: Jolyn Nap, MD;  Location: [facility];  Service: Orthopedics;  Laterality: Right;  RIGHT LONG FINGER  TRIGGER FINGER RELEASE    Her Family History Is Significant For: Family History  Problem Relation Age of Onset  . CVA Mother   . Diabetes Father   . Parkinson's disease Paternal Uncle   .  Cataracts Sister     Her Social History Is Significant For: Social History   Social History  . Marital status: Widowed    Spouse name: Ronalee Belts  . Number of children: 2  . Years of education: 14   Occupational History  . RETIRED     Social History Main Topics  . Smoking status: Never Smoker  . Smokeless tobacco: Never Used  . Alcohol use Yes     Comment: Occasional,one glass of wine weekly  . Drug use: No  . Sexual activity: Not Currently    Partners: Male   Other Topics Concern  . None   Social History Narrative   Marital Status:Widowed Ronalee Belts)    Children:  Son Clare Gandy) Daughter [name])    Pets: Dog Terri Piedra)   Living Situation: Lives alone  Occupation: Retired     Education: Geophysicist/field seismologist in [place] Use/Exposure:  None    Alcohol Use:  Occasional 1-2 per week   Drug Use:  None   Diet:  Regular   Exercise:  None   Hobbies: Reading, Quilting   Patient is right handed                      Her Allergies Are:  Allergies  Allergen Reactions  . Zocor [Simvastatin]     Myalgia  :   Her Current Medications Are:  Outpatient Encounter Prescriptions as of 10/22/2016  Medication Sig  . clonazePAM (KLONOPIN) 0.5 MG tablet Take 0.5 mg by mouth.  . incobotulinumtoxinA (XEOMIN) 100 units SOLR injection Inject into the muscle.  . multivitamin-iron-minerals-folic acid (CENTRUM) chewable tablet Chew 1 tablet by mouth daily.  . propranolol ER (INDERAL LA) 60 MG 24 hr capsule TAKE ONE CAPSULE BY MOUTH ONE TIME DAILY  . traZODone (DESYREL) 50 MG tablet Take 50 mg by mouth.   Facility-Administered Encounter Medications as of 10/22/2016  Medication  . incobotulinumtoxinA (XEOMIN) 100 units injection 100 Units  . incobotulinumtoxinA (XEOMIN) 100 units injection 100 Units  . incobotulinumtoxinA (XEOMIN) 100 units injection 100 Units  . incobotulinumtoxinA (XEOMIN) 100 units injection 100 Units  . incobotulinumtoxinA (XEOMIN) 100 UNITS injection 150 Units  .  incobotulinumtoxinA (XEOMIN) 50 units injection 150 Units  . incobotulinumtoxinA (XEOMIN) 50 units injection 50 Units  . incobotulinumtoxinA (XEOMIN) 50 units injection 50 Units  . incobotulinumtoxinA (XEOMIN) 50 units injection 50 Units  . incobotulinumtoxinA (XEOMIN) 50 units injection 50 Units  :  Review of Systems:  Out of a complete 14 point review of systems, all are reviewed and negative with the exception of these symptoms as listed below: Review of Systems  Neurological:       Pt presents today to discuss her memory tests by Dr. Si Raider.    Objective:  Neurological Exam  Physical Exam Physical Examination:   Vitals:   10/22/16 1324  BP: (!) 142/74  Pulse: 70   General Examination: The patient is a very pleasant 71 y.o. female in no acute distress. She appears well-developed and well-nourished and well groomed.   HEENT: Normocephalic, atraumatic, pupils are equal, round and reactive to light and accommodation. Funduscopic exam is normal with sharp disc margins noted. S/p cataract repairs b/l. Extraocular tracking is good without limitation to gaze excursion or nystagmus noted. Normal smooth pursuit is noted. Hearing is grossly intact. Face is symmetric with normal facial animation and normal facial sensation. Speech without dysarthria noted. There is no hypophonia. There is a side to side head/neck tremor and mild voice tremor. Mild R torticollis, L laterocollis, L shoulder higher. Oropharynx exam reveals: mild mouth dryness, adequate dental hygiene.   Chest: Clear to auscultation without wheezing, rhonchi or crackles noted.  Heart: S1+S2+0, regular and normal without murmurs, rubs or gallops noted.   Abdomen: Soft, non-tender and non-distended with normal bowel sounds appreciated on auscultation.  Extremities: There is no pitting edema in the distal lower extremities bilaterally. Pedal pulses are intact.  Skin: Warm and dry without trophic changes  noted.  Musculoskeletal: exam reveals no obvious joint deformities, tenderness or joint swelling or erythema. S/p b/l TKA.  Neurologically:  Mental status: The patient is awake, alert and oriented in all 4 spheres. Her immediate and remote memory, attention, language skills and fund of knowledge are appropriate. There is no evidence of aphasia, agnosia, apraxia  or anomia. Speech is clear with normal prosody and enunciation. Thought process is linear. Mood is constricted and affect is normal.   On 03/11/2016: MOCA 22/30, CDT: 4/5, category fluency 11/min. (She missed 5 points on remote recall, one point on repetition, one point on clock drawing, one point on orientation for exact dates).   Cranial nerves II - XII are as described above under HEENT exam.  Motor exam: Normal bulk, strength and tone is noted. There is no drift, tremor or rebound. Reflexes are 1+ throughout. Fine motor skills and coordination: grossly intact.  Cerebellar testing: No dysmetria or intention tremor on finger to nose testing. Heel to shin is unremarkable bilaterally. There is no truncal or gait ataxia.  Sensory exam: intact to light touch in the upper and lower extremities.  Gait, station and balance: She stands easily. No veering to one side is noted. No leaning to one side is noted. Posture is age-appropriate and stance is narrow based. Gait shows normal stride length and normal pace. No problems turning are noted.   Assessment and Plan:    In summary, DESEREA BORDLEY is a very pleasant 71 year old female with an underlying medical history of hypertension, hyperlipidemia, reflux disease, trigger finger, tremor, endometriosis, and osteoarthritis, status post bilateral knee replacement surgeries, status post cataract repairs, obesity, cervical dystonia (receiving botulinum toxin injections q54mo, who presents for follow-up consultation of her memory loss. Percent, she has had memory issues for the past 5 years. Of note,  she has in the past missed appointments and also reported getting lost while coming to the clinic here when she had been here before. She has had forgetfulness, abnormal MOCA score in 2/18. She had a brain MRI in 2014 with fairly age-appropriate findings and mild microvascular changes, repeat brain MRI in 2/18 showed mild atrophy and mild white matter changes, no significant progression compared to 2014. She recently had neuropsychological evaluation in 8/18 with test results in keeping with mild dementia of the Alzheimer's type. Today, I suggested we start her on Aricept generic low dose, 5 mg strength. I talked to the patient and her son about potential side effects and expectations. She is advised to limit her driving and pursue a healthy lifestyle. She is encouraged to exercise more in the form of walking if possible. I suggested we recheck in 3 months with one of our nurse practitioners at which time we can try to increase the Aricept to 10 mg daily. She is also going to have her routine botulinum toxin injection in early December for her cervical dystonia.  I answered all their questions today and the patient and her son were in agreement. I spent 25 minutes in total face-to-face time with the patient, more than 50% of which was spent in counseling and coordination of care, reviewing test results, reviewing medication and discussing or reviewing the diagnosis of dementia, its prognosis and treatment options. Pertinent laboratory and imaging test results that were available during this visit with the patient were reviewed by me and considered in my medical decision making (see chart for details).

## 2016-12-08 ENCOUNTER — Ambulatory Visit: Payer: Self-pay | Admitting: Neurology

## 2016-12-10 ENCOUNTER — Ambulatory Visit: Payer: Self-pay | Admitting: Neurology

## 2016-12-10 ENCOUNTER — Telehealth: Payer: Self-pay | Admitting: Neurology

## 2016-12-10 NOTE — Telephone Encounter (Signed)
Pt called to check appt's. She noticed on her appt for xeomin she documented to bring someone with her, she does not know why. Plus she wants to know if someone should come with her to the appt in January. She is wanting to know if that is correct. Please to clarify

## 2016-12-10 NOTE — Telephone Encounter (Signed)
I spoke with Dr. Rexene Alberts. She is ok with pt coming by herself to the xeomin appts, but needs to bring someone with her to all other appts. I called pt and she is agreeable to this.

## 2017-01-13 ENCOUNTER — Ambulatory Visit: Payer: Medicare Other | Admitting: Neurology

## 2017-01-13 ENCOUNTER — Telehealth: Payer: Self-pay | Admitting: Neurology

## 2017-01-13 NOTE — Telephone Encounter (Signed)
I called pt, offered her an appt tomorrow 01/14/17 at 11:00am with Dr. Rexene Alberts. Pt verbalized understanding of new appt date and time.

## 2017-01-13 NOTE — Telephone Encounter (Signed)
Patient called to cancel Xeomin apt today due to weather would like to know if she can be rescheduled. I told her there was no availability until next year but she doesn't want to wait that long unless absolutely necessary. Please call advise.

## 2017-01-13 NOTE — Telephone Encounter (Signed)
Patient called back and requested to speak with the nurse regarding rescheduling. I informed her that I sent a note over to the nurse and she would be hearing from [place] shortly.

## 2017-01-14 ENCOUNTER — Ambulatory Visit: Payer: Self-pay | Admitting: Neurology

## 2017-01-14 NOTE — Telephone Encounter (Signed)
Pt did not show for her xeomin appt with Dr. Rexene Alberts today.

## 2017-01-14 NOTE — Telephone Encounter (Signed)
Please when you call, talk to pt's daughter or son to reschedule, and this will be the last time, we will make yet another except for her to come. She has had MULTIPLE no shows and I do not feel comfortable providing erratic care like this and she may have to start seeing another office for her ongoing neurologic care.

## 2017-01-15 NOTE — Telephone Encounter (Signed)
I called pt, spoke to pt's daughter [name], per [facility]. I advised pt's daughter that pt has no showed several times and this will be the last time we reschedule her. Pt may need to start seeing another office for her ongoing neurologic care. Dr. Rexene Alberts does not feel comfortable providing erratic care for her. Pt's daughter is in agreement to this and is also agreeable to an appt for pt on 01/18/17 at 9:00am. Pt's daughter verbalized understanding of appt date and time and that this is the last time rescheduling pt from missed appts.

## 2017-01-18 ENCOUNTER — Encounter: Payer: Self-pay | Admitting: Neurology

## 2017-01-18 ENCOUNTER — Telehealth: Payer: Self-pay | Admitting: Neurology

## 2017-01-18 ENCOUNTER — Ambulatory Visit (INDEPENDENT_AMBULATORY_CARE_PROVIDER_SITE_OTHER): Payer: Medicare Other | Admitting: Neurology

## 2017-01-18 VITALS — BP 132/75 | HR 67 | Ht 67.0 in | Wt 187.0 lb

## 2017-01-18 DIAGNOSIS — G243 Spasmodic torticollis: Secondary | ICD-10-CM

## 2017-01-18 DIAGNOSIS — G252 Other specified forms of tremor: Secondary | ICD-10-CM

## 2017-01-18 MED ORDER — INCOBOTULINUMTOXINA 100 UNITS IM SOLR
200.0000 [IU] | INTRAMUSCULAR | Status: DC
  Administered 2017-01-18: 200 [IU] via INTRAMUSCULAR

## 2017-01-18 NOTE — Progress Notes (Signed)
Alyssa Macdonald is a 71 y.o. female with an underlying medical history of hypertension, hyperlipidemia, reflux disease, trigger finger, tremor, endometriosis, and osteoarthritis, memory loss and cervical dystonia, who presents for repeat botulinum toxin injection for her cervical dystonia. She is accompanied by her daughter today. Of note, she no showed for her injection appointment on 01/13/2017 and 01/14/17. She had her last injection on 10/12/2016, at which time she did well.   I saw her on 10/22/2016, at which time we talked about her brain MRI results and her neuropsych test results.   Today, 01/18/2017:   She reports doing okay as far as the injections go. She recently received the shingles shot and developed a reaction locally at her deltoid muscle.  The patient's allergies, current medications, family history, past medical history, past social history, past surgical history and problem list were reviewed and updated as appropriate.    Alyssa Macdonald is a 71 y.o. female with an underlying medical history of hypertension, hyperlipidemia, reflux disease, trigger finger, tremor, endometriosis, and osteoarthritis, memory loss and cervical dystonia, who presents for her repeat botulinum toxin injections for her diagnosis of cervical dystonia. She no showed for an appointment on 09/29/2016. Her last injection was on 06/25/2016, at which time she reported doing well, reported no side effects or new symptoms. She was advised to reschedule her appointment with neuropsychology. She had an appointment in the interim on 09/24/2016 with a full report pending and also follow-up pending as I understand with Dr. Bonita Quin.    Today, 10/12/2016 (all dictated new, as well as above notes, some dictation done in note pad or Word, outside of chart, may appear as copied):  She reports doing well, no SEs, no new Sx. No new Sx. L deltoid mildly red, swollen and feels itchy.     O/E: BP 132/75   Pulse 67   Ht _0  (1.702  m)   Wt 187 lb (84.8 kg)   BMI 29.29 kg/m   She has evidence of mild right torticollis and slight left shoulder elevation. 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 She also has a voice tremor, mild and a very mild L laterocollis, all stable.   Written informed consent has previously been obtained and scanned into the patient's electronic chart. We will re-consent if the type of botulinum toxin or the dosing or the distribution changes in the future. The patient is informed that we will use the same consent for her recurrent, most likely 3 monthly injections. I have previously talked to the patient in detail about expectations, limitations, benefits as well as potential adverse effects of botulinum toxin injections. The patient understands that the side effects are not limited to the ones I discussed, which include mouth dryness, dryness of eyes, speech and swallowing difficulties, respiratory depression or problems breathing, weakness of muscles including more distant muscles than the ones injected, flu-like symptoms, myalgias, injection site reactions such as redness, itching, swelling, pain, and infection.   200 units of botulinum toxin type A were reconstituted using preservative-free normal saline to a concentration of 10 units per 0.1 mL and drawn up into 1 mL tuberculin syringes. Lot number was [id number] and expiration date December 2020 for both 100 unit vials.    The patient was situated in a chair, sitting comfortably. After preparing the areas with 70% isopropyl alcohol and using a 26 gauge 1/2 inch hollow lumen recording EMG needle, a total dose of 130 units of botulinum toxin type A in the form  of Xeomin was injected into the muscles and the following distribution and quantities:   #1: 25 units in the left SCM #2: 15 units in the right SCM #3: 25 units in the left splenius capitis muscle #4: 10 units in the left semispinalis capitis muscle   #5: 10 units in the right semispinalis  capitis muscle   #6: 25 units in the left levator scapulae muscle   #7: 20 units in the left upper trapezius muscle    EMG guidance was utilized for these injections with minimal to mild EMG activity noted, especially in the levator scapulae on the left, bilateral splenius capitis muscles, bilateral SCM's, bilateral semispinalis capitis muscles.     A dose of 70 out of a total dose of 200 units was discarded as unavoidable waste. The patient tolerated the procedure well without immediate complications. She was advised to make a followup appointment for repeat injections.   Previously (copied from previous notes for reference):  She had neuropsychological evaluation with Dr. 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 Results of the current cognitive evaluation reveal impaired verbal and non-verbal memory retrieval and consolidation as well as reduced verbal fluency. Additionally, there is evidence that her cognitive deficits are interfering with her ability to manage complex tasks, such as driving (forgetting familiar routes) and managing appointments. As such, diagnostic criteria for a dementia syndrome are met.    The patient's cognitive profile is suggestive of medial-temporal lobe involvement. Alzheimer's disease is the most likely etiology, given her cognitive profile and clinical features. The patient's stage of dementia can be characterized as mild. She retains many areas of strength on cognitive testing, including in processing speed, attention, visual-spatial construction and executive functioning.   There is no evidence of primary psychiatric disorder. Her current mood appears euthymic, and there is no behavioral disturbance.     Recommendations:   1. Treatment/Medication: The patient may be an appropriate candidate for cholinesterase inhibitor therapy. She will follow up with Dr. Rexene Alberts  about this.   2. Complex ADLs: The patient would benefit from assistance with managing her appointments (e.g., oversight of her calendar/appointments and cueing/reminders). Monitoring of her medication management is also recommended. She performed well on a cognitive test highly correlated with driving ability, but her memory impairment puts her at risk for becoming lost. She should limit her driving to local/familiar locations during the day. Oversight of her management of finances is also recommended to ensure errors are not made.    3. Education/Support: The patient's family can seek additional education and support from [employer] and Tax adviser.   4. Quality of Life/Optimization of Cognitive Functioning: The patient should continue to participate in activities which provide mental stimulation, safe cardiovascular exercise, and social interaction. She appears to be an ideal candidate for an assisted living community where she would have more support as well as more opportunities for structured social activities.   5. Neuropsychological re-assessment in 1-2 years could be considered in order to monitor cognitive status, track progression of symptoms and further assist with treatment planning.   >>   She no showed for an appointment on 09/29/2016. Her last injection was on 06/25/2016, at which time she reported doing well, reported no side effects or new symptoms. She was advised to reschedule her appointment with neuropsychology. She had an appointment in the interim on 09/24/2016 with a full report pending and also follow-up pending  as I understand with Dr. Bonita Quin.   She had an injection on 03/18/2016, at which time she did well with her injection, reported no side effects.    She had an injection under Dr. Krista Blue on 12/11/2015. I have seen her in the interim on 03/11/2016 as a new problem referral for memory loss.    03/11/2016: She reports memory issues  "all her life", denies issues driving, feels like she has issues with paying attention. Mother had AD, mother was around 49 when she passed, but she does not know. Her father lived to be 49s, had emphysema. She herself does not smoke, occasional wine, drinks 2 cups of coffee in AM. Has not slept well since her husband passed in 10/14. She is still on clonazepam. I reviewed Dr. Merilyn Baba note from 12/12/15, and patient was advised to continue with propranolol, and stop the clonazepam, but continues to take it at the current dose of 0.5 mg qHS, has trazodone, but does not take it.  She endorses symptoms of depression since her husband passed away. She has had intermittent suicidal ideations, no current suicidal ideations, no plan.    She had a brain MRI with and without contrast on 10/20/2012 which I reviewed: IMPRESSION:  Slightly abnormal MRI scan of the brain showing mild changes of chronic microvascular ischemia.   She had an injection on 08/29/2015, at which time she reported doing well and she felt that the medication had worked quite well, no side effects were reported.   She had injection on 05/27/2015, at which time she reported doing fairly well. She had received her previous injection under Dr. Posey Pronto. We reviewe I reviewed her note at the time. She had an injection in January 2017 and follow-up with him in March 2017. She reported no side effects. She was agreeable with pursuing her previous injections that I did.    She had an injection with me on 10/24/2014, at which time she received a total dose of 130 units of Xeomin.    She received an injection under Dr. Posey Pronto with physical rehabilitation 02/15/2015 for total dose of 160 units. She was seen in follow-up in March 2017. I reviewed the office note and procedure note from Dr. Posey Pronto.    Her overall seventh injection was on 07/24/2014, at which time she received a total dose of 130 units of Xeomin.    Her 6th injection was on 04/19/2014, at  which time she received a total dose of 130 units of Xeomin.    Her 5th injection was on 01/19/2014, at which time she was re-consented her as this was the first injection with me.    She previously followed with Dr. Janann Colonel and her last injection with him was on 10/11/2013 (4th), at which time she received a total dose of 135 units of botulinum toxin type A in the form of Xeomin.    Her 3rd injection was on 07/04/2013 at which time she reported good benefit to her symptoms and no adverse effects. She felt it wore off after several weeks, approximately 2-3 weeks before she was due for another injection.    Her first injection was on 12/22/2013 and her second injection was on 03/24/2013. The condition has existed for more than 6 months, and pt does not have a diagnosis of ALS, Myasthenia Gravis or Lambert-Eaton Syndrome.

## 2017-01-18 NOTE — Telephone Encounter (Signed)
Return in about 3 months (around 04/18/2017), or for repeat Xeomin inj.

## 2017-01-18 NOTE — Patient Instructions (Signed)
As discussed, botulinum toxin takes about 3-7 days to kick in. Please remember, this is not a pain shot, this is to gradually improve your symptoms. In some patients it takes up to 2-3 weeks to make a difference and it wears off with time. Sometimes it may wear off before it is time for the next injection. We still should wait till the next 3 monthly injection, because injecting too frequently may cause you to develop immunity to the botulinum toxin. We are looking for a reduction in your headache frequency and headache severity. Side effects to look out for are mouth dryness, dryness of the eyes, heaviness of your head or muscle weakness, rarely, speech or swallowing difficulties and very rarely breathing difficulties. Some people have transient neck pain or soreness which typically responds to over-the-counter anti-inflammatory medication and local heat application with a heat pad. If you think you have a severe reaction to the botulinum toxin, such as weakness, trouble speaking, trouble breathing, or trouble swallowing, you have to call 911 or have someone take you to the nearest emergency room. However, most people have no side effects from the injections. It is normal to have a little bit of redness and swelling around the injection sites which usually improves after a few hours. Rarely, there may be a bruise that improves on its own. Most side effects reported are very mild and resolve within 10-14 days. Please feel free to call us if you have any additional questions or concerns: [phone].

## 2017-01-20 NOTE — Telephone Encounter (Signed)
I called the patient to schedule her next injection. She did not answer and her VM was full.

## 2017-02-04 NOTE — Telephone Encounter (Signed)
I called and spoke with patients daughter and scheduled apt.

## 2017-02-15 ENCOUNTER — Ambulatory Visit (INDEPENDENT_AMBULATORY_CARE_PROVIDER_SITE_OTHER): Payer: Medicare Other | Admitting: Neurology

## 2017-02-15 ENCOUNTER — Encounter: Payer: Self-pay | Admitting: Neurology

## 2017-02-15 VITALS — BP 130/88 | HR 76 | Ht 67.0 in | Wt 185.0 lb

## 2017-02-15 DIAGNOSIS — F0280 Dementia in other diseases classified elsewhere without behavioral disturbance: Secondary | ICD-10-CM

## 2017-02-15 DIAGNOSIS — G309 Alzheimer's disease, unspecified: Secondary | ICD-10-CM

## 2017-02-15 DIAGNOSIS — G250 Essential tremor: Secondary | ICD-10-CM | POA: Diagnosis not present

## 2017-02-15 DIAGNOSIS — G252 Other specified forms of tremor: Secondary | ICD-10-CM | POA: Diagnosis not present

## 2017-02-15 DIAGNOSIS — G243 Spasmodic torticollis: Secondary | ICD-10-CM | POA: Diagnosis not present

## 2017-02-15 MED ORDER — DONEPEZIL HCL 10 MG PO TABS
10.0000 mg | ORAL_TABLET | Freq: Every day | ORAL | 3 refills | Status: AC
Start: 2017-02-15 — End: ?

## 2017-02-15 NOTE — Patient Instructions (Addendum)
I would like for you to increase your Aricept (generic name: donepezil) from 5 mg to 10 mg daily.   Common side effects may include dry eyes, dry mouth, confusion, low pulse, low blood pressure, also GI related side effects (nausea, vomiting, diarrhea, constipation), headaches; rare side effects may include hallucinations and seizures.   I would like to have you come back for a follow up for memory recheck with one of our nurse practitioners in 6 months.    Please continue to exercise daily, eat well and drink plenty of water.   I am worried about your driving, please have your family monitor it and I would suggest at this point only local roads, familiar routes, no nighttime and no highway driving, no longer distance, even driving to [place] to see your son.

## 2017-02-15 NOTE — Progress Notes (Signed)
Subjective:    Patient ID: Alyssa Macdonald is a 72 y.o. female.  HPI     Interim history:    Alyssa Macdonald is a 72 y.o. female with an underlying medical history of hypertension, hyperlipidemia, reflux disease, trigger finger, tremor, endometriosis, and osteoarthritis, memory loss and cervical dystonia, who presents for follow-up consultation of her memory loss. She is accompanied by her daughter today. I last saw her on 01/18/2017 for her botulinum toxin injection for her cervical dystonia. I started her on low-dose Aricept generic in September 2018.  Today, 02/15/2017:  She reports feeling stable, has been able to tolerate the Aricept generic 5 mg daily. Daughter has noticed no significant new issues, in fact, she feels that memory seems to be stable or slightly improved since September 2018. Her daughter does bring up the issue of driving longer distances as patient would like to be able to drive to [place] to see her son. She is doing fairly well with local roads and familiar routes within a smaller radius. Patient likes to eat out with friends at times but also needs an. She tries to hydrate well, she has 2 dogs and she walks every day. She has noticed a hand tremor which has become worse with time. Of note, daughter also has a hand tremor and patient's granddaughter who is 10 years old also has a mild hand tremor.  The patient's allergies, current medications, family history, past medical history, past social history, past surgical history and problem list were reviewed and updated as appropriate.   Previously (copied from previous notes for reference):   I saw her on 10/22/2016, at which time we talked about her brain MRI results and her neuropsych test results.   She had neuropsychological evaluation with Dr. 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 Results of the current cognitive  evaluation reveal impaired verbal and non-verbal memory retrieval and consolidation as well as reduced verbal fluency. Additionally, there is evidence that her cognitive deficits are interfering with her ability to manage complex tasks, such as driving (forgetting familiar routes) and managing appointments. As such, diagnostic criteria for a dementia syndrome are met.    The patient's cognitive profile is suggestive of medial-temporal lobe involvement. Alzheimer's disease is the most likely etiology, given her cognitive profile and clinical features. The patient's stage of dementia can be characterized as mild. She retains many areas of strength on cognitive testing, including in processing speed, attention, visual-spatial construction and executive functioning.   There is no evidence of primary psychiatric disorder. Her current mood appears euthymic, and there is no behavioral disturbance.   Recommendations:   1. Treatment/Medication: The patient may be an appropriate candidate for cholinesterase inhibitor therapy. She will follow up with Dr. Rexene Alberts about this.   2. Complex ADLs: The patient would benefit from assistance with managing her appointments (e.g., oversight of her calendar/appointments and cueing/reminders). Monitoring of her medication management is also recommended. She performed well on a cognitive test highly correlated with driving ability, but her memory impairment puts her at risk for becoming lost. She should limit her driving to local/familiar locations during the day. Oversight of her management of finances is also recommended to ensure errors are not made.    3. Education/Support: The patient's family can seek additional education and support from [employer] and Tax adviser.   4. Quality of Life/Optimization of Cognitive Functioning: The patient should continue  to participate in activities which provide mental stimulation, safe cardiovascular  exercise, and social interaction. She appears to be an ideal candidate for an assisted living community where she would have more support as well as more opportunities for structured social activities.   5. Neuropsychological re-assessment in 1-2 years could be considered in order to monitor cognitive status, track progression of symptoms and further assist with treatment planning.   >>   She no showed for an appointment on 09/29/2016. Her last injection was on 06/25/2016, at which time she reported doing well, reported no side effects or new symptoms. She was advised to reschedule her appointment with neuropsychology. She had an appointment in the interim on 09/24/2016 with a full report pending and also follow-up pending as I understand with Dr. Bonita Quin.    She had an injection on 03/18/2016, at which time she did well with her injection, reported no side effects.    She had an injection under Dr. Krista Blue on 12/11/2015. I have seen her in the interim on 03/11/2016 as a new problem referral for memory loss.    03/11/2016: She reports memory issues "all her life", denies issues driving, feels like she has issues with paying attention. Mother had AD, mother was around 44 when she passed, but she does not know. Her father lived to be 50s, had emphysema. She herself does not smoke, occasional wine, drinks 2 cups of coffee in AM. Has not slept well since her husband passed in 10/14. She is still on clonazepam. I reviewed Dr. Merilyn Baba note from 12/12/15, and patient was advised to continue with propranolol, and stop the clonazepam, but continues to take it at the current dose of 0.5 mg qHS, has trazodone, but does not take it.  She endorses symptoms of depression since her husband passed away. She has had intermittent suicidal ideations, no current suicidal ideations, no plan.    She had a brain MRI with and without contrast on 10/20/2012 which I reviewed: IMPRESSION:  Slightly abnormal MRI scan of the brain  showing mild changes of chronic microvascular ischemia.   She had an injection on 08/29/2015, at which time she reported doing well and she felt that the medication had worked quite well, no side effects were reported.   She had injection on 05/27/2015, at which time she reported doing fairly well. She had received her previous injection under Dr. Posey Pronto. We reviewe I reviewed her note at the time. She had an injection in January 2017 and follow-up with him in March 2017. She reported no side effects. She was agreeable with pursuing her previous injections that I did.    She had an injection with me on 10/24/2014, at which time she received a total dose of 130 units of Xeomin.    She received an injection under Dr. Posey Pronto with physical rehabilitation 02/15/2015 for total dose of 160 units. She was seen in follow-up in March 2017. I reviewed the office note and procedure note from Dr. Posey Pronto.    Her overall seventh injection was on 07/24/2014, at which time she received a total dose of 130 units of Xeomin.    Her 6th injection was on 04/19/2014, at which time she received a total dose of 130 units of Xeomin.    Her 5th injection was on 01/19/2014, at which time she was re-consented her as this was the first injection with me.    She previously followed with Dr. Janann Colonel and her last injection with him was on 10/11/2013 (4th),  at which time she received a total dose of 135 units of botulinum toxin type A in the form of Xeomin.    Her 3rd injection was on 07/04/2013 at which time she reported good benefit to her symptoms and no adverse effects. She felt it wore off after several weeks, approximately 2-3 weeks before she was due for another injection.    Her first injection was on 12/22/2013 and her second injection was on 03/24/2013. The condition has existed for more than 6 months, and pt does not have a diagnosis of ALS, Myasthenia Gravis or Lambert-Eaton Syndrome.  Her Past Medical History Is  Significant For: Past Medical History:  Diagnosis Date  . Cervical dystonia   . Complication of anesthesia    slow to awaken  . Endometriosis   . Esophageal reflux   . Essential and other specified forms of tremor   . GERD (gastroesophageal reflux disease)   . Hyperlipidemia   . Hypertension   . Hypopotassemia   . Osteoarthrosis involving, or with mention of more than one site, but not specified as generalized, site unspecified(715.80)   . Osteoarthrosis involving, or with mention of more than one site, but not specified as generalized, site unspecified(715.80)   . 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 Trigger finger     Her Past Surgical History Is Significant For: Past Surgical History:  Procedure Laterality Date  . ABDOMINAL HYSTERECTOMY    . APPENDECTOMY    . CHOLECYSTECTOMY    . COLON SURGERY     bowel resection  . FOOT SURGERY    . JOINT REPLACEMENT     bilateral  . TONSILLECTOMY    . TOTAL KNEE ARTHROPLASTY     Bilateral  . TRIGGER FINGER RELEASE  02/01/2012   Procedure: RELEASE TRIGGER FINGER/A-1 PULLEY;  Surgeon: Jolyn Nap, MD;  Location: [facility];  Service: Orthopedics;  Laterality: Right;  RIGHT LONG FINGER  TRIGGER FINGER RELEASE    Her Family History Is Significant For: Family History  Problem Relation Age of Onset  . CVA Mother   . Diabetes Father   . Parkinson's disease Paternal Uncle   . Cataracts Sister     Her Social History Is Significant For: Social History   Socioeconomic History  . Marital status: Widowed    Spouse name: Ronalee Belts  . Number of children: 2  . Years of education: 62  . Highest education level: None  Social Needs  . Financial resource strain: None  . Food insecurity - worry: None  . Food insecurity - inability: None  . Transportation needs - medical: None  . Transportation needs - non-medical: None  Occupational History  . Occupation: RETIRED   Tobacco Use  . Smoking status: Never  Smoker  . Smokeless tobacco: Never Used  Substance and Sexual Activity  . Alcohol use: Yes    Comment: Occasional,one glass of wine weekly  . Drug use: No  . Sexual activity: Not Currently    Partners: Male  Other Topics Concern  . None  Social History Narrative   Marital Status:Widowed Ronalee Belts)    Children:  Son Clare Gandy) Daughter [name])    Pets: Dog Terri Piedra)   Living Situation: Lives alone   Occupation: Retired     Education: Geophysicist/field seismologist in [place] Use/Exposure:  None    Alcohol Use:  Occasional 1-2 per week   Drug Use:  None   Diet:  Regular   Exercise:  None  Hobbies: Reading, Quilting   Patient is right handed                      Her Allergies Are:  Allergies  Allergen Reactions  . Zocor [Simvastatin]     Myalgia  :   Her Current Medications Are:  Outpatient Encounter Medications as of 02/15/2017  Medication Sig  . clonazePAM (KLONOPIN) 0.5 MG tablet Take 0.5 mg by mouth.  . donepezil (ARICEPT) 5 MG tablet Take 1 tablet (5 mg total) by mouth at bedtime.  . incobotulinumtoxinA (XEOMIN) 100 units SOLR injection Inject into the muscle.  . multivitamin-iron-minerals-folic acid (CENTRUM) chewable tablet Chew 1 tablet by mouth daily.  . propranolol ER (INDERAL LA) 60 MG 24 hr capsule TAKE ONE CAPSULE BY MOUTH ONE TIME DAILY  . traZODone (DESYREL) 50 MG tablet Take 50 mg by mouth.   Facility-Administered Encounter Medications as of 02/15/2017  Medication  . incobotulinumtoxinA (XEOMIN) 100 units injection 100 Units  . incobotulinumtoxinA (XEOMIN) 100 units injection 100 Units  . incobotulinumtoxinA (XEOMIN) 100 units injection 100 Units  . incobotulinumtoxinA (XEOMIN) 100 units injection 100 Units  . incobotulinumtoxinA (XEOMIN) 100 UNITS injection 150 Units  . incobotulinumtoxinA (XEOMIN) 100 units injection 200 Units  . incobotulinumtoxinA (XEOMIN) 50 units injection 150 Units  . incobotulinumtoxinA (XEOMIN) 50 units injection 50 Units  .  incobotulinumtoxinA (XEOMIN) 50 units injection 50 Units  . incobotulinumtoxinA (XEOMIN) 50 units injection 50 Units  . incobotulinumtoxinA (XEOMIN) 50 units injection 50 Units  :  Review of Systems:  Out of a complete 14 point review of systems, all are reviewed and negative with the exception of these symptoms as listed below: Review of Systems  Neurological:       Pt presents today to follow up on her memory. Pt feels that things have been stable.    Objective:  Neurological Exam  Physical Exam Physical Examination:   Vitals:   02/15/17 1455  BP: 130/88  Pulse: 76    General Examination: The patient is a very pleasant 72 y.o. female in no acute distress. She appears well-developed and well-nourished and well groomed.   HEENT:Normocephalic, atraumatic, pupils are equal, round and reactive to light and accommodation. Funduscopic exam is normal with sharp disc margins noted. S/p cataract repairs b/l. Extraocular tracking is good without limitation to gaze excursion or nystagmus noted. Normal smooth pursuit is noted. Hearing is grossly intact. Face is symmetric with normal facial animation and normal facial sensation. Speech without dysarthria noted. There is no hypophonia. There is a side to side head/neck tremor and mild voice tremor. Mild R torticollis, L laterocollis, L shoulder higher. Oropharynx exam reveals: mild mouth dryness, adequatedental hygiene, all stable.   Chest:Clear to auscultation without wheezing, rhonchi or crackles noted.  Heart:S1+S2+0, regular and normal without murmurs, rubs or gallops noted.   Abdomen:Soft, non-tender and non-distended with normal bowel sounds appreciated on auscultation.  Extremities:There is nopitting edema in the distal lower extremities bilaterally. Pedal pulses are intact.  Skin: Warm and dry without trophic changes noted.  Musculoskeletal: exam reveals no obvious joint deformities, tenderness or joint swelling or  erythema. S/p b/l TKA.  Neurologically:  Mental status: The patient is awake, alert and oriented in all 4 spheres. Herimmediate and remote memory, attention, language skills and fund of knowledge are fair. There is no evidence of aphasia, agnosia, apraxia or anomia. Speech is clear with normal prosody and enunciation. Thought process is linear. Mood is constrictedand affect is normal.  On 03/11/2016: MOCA 22/30, CDT: 4/5, category fluency 11/min. (She missed 5 points on remote recall, one point on repetition, one point on clock drawing, one point on orientation for exact dates).  Cranial nerves II - XII are as described above under HEENT exam.  Motor exam: Normal bulk, strength and tone is noted. There is no drift, tremor or rebound. Reflexes are 1+ throughout. Fine motor skills and coordination: grossly intact.  Cerebellar testing: No dysmetria or intention tremor on finger to nose testing. Heel to shin is unremarkable bilaterally. There is no truncal or gait ataxia.  Sensory exam: intact to light touch in the upper and lower extremities.  Gait, station and balance: Shestands easily. No veering to one side is noted. No leaning to one side is noted. Posture is age-appropriate and stance is narrow based. Gait shows normalstride length and normalpace. No problems turning are noted.   Assessment and Plan:   In summary, Alyssa Macdonald a very pleasant 72 year old female with an underlying medical history of hypertension, hyperlipidemia, reflux disease, trigger finger, tremor, endometriosis, and osteoarthritis, status post bilateral knee replacement surgeries, status post cataract repairs, obesity, cervical dystonia (receiving botulinum toxin injections q104mo, who presents for follow-up consultation of her memory loss. Per family, they had noted memory issues for the past 5+ years. Of note, she has in the past missed appointments and also reported getting lost while coming to the clinic here  when she had been here before several times before. She has had forgetfulness, abnormal MOCA score in 2/18. She had a brain MRI in 2014 with fairly age-appropriate findings and mild microvascular changes, repeat brain MRI in 2/18 showed mild atrophy and mild white matter changes, no significant progression compared to 2014. She recently had neuropsychological evaluation in 8/18 with test results in keeping with mild dementia of the Alzheimer's type. She was started on Aricept generic low dose, 5 mg strength in September 2018 and has been able to tolerate this. Today, I suggested we increase this to 10 mg once daily. She's agreeable. We talked about her driving again today. Diarrhea tolerated the importance of staying within the recommendations which is local roads, familiar routes, no highways, no nighttime and no long[place] driving [place]. Her daughter is in agreement with this recommendation as well. Her neuropsychological test report also recommended this restriction. I suggested a six-month follow-up with one of our nurse practitioners, I changed the Aricept prescription to 10 mg strength once daily.  She is also going to have her repeat botulinum toxin injection as scheduled in March 2019.  I answered all the questions today and the patient and her daughter were in agreement.  I spent 25 minutes in total face-to-face time with the patient, more than 50% of which was spent in counseling and coordination of care, reviewing test results, reviewing medication and discussing or reviewing the diagnosis of dementia, its prognosis and treatment options. Pertinent laboratory and imaging test results that were available during this visit with the patient were reviewed by me and considered in my medical decision making (see chart for details).

## 2017-03-04 ENCOUNTER — Ambulatory Visit: Payer: Medicare Other | Admitting: Adult Health

## 2017-04-15 ENCOUNTER — Encounter: Payer: Self-pay | Admitting: Neurology

## 2017-04-15 ENCOUNTER — Ambulatory Visit (INDEPENDENT_AMBULATORY_CARE_PROVIDER_SITE_OTHER): Payer: Medicare Other | Admitting: Neurology

## 2017-04-15 VITALS — BP 82/64 | HR 84 | Ht 67.0 in | Wt 194.0 lb

## 2017-04-15 DIAGNOSIS — G243 Spasmodic torticollis: Secondary | ICD-10-CM

## 2017-04-15 DIAGNOSIS — G252 Other specified forms of tremor: Secondary | ICD-10-CM

## 2017-04-15 MED ORDER — INCOBOTULINUMTOXINA 100 UNITS IM SOLR
200.0000 [IU] | INTRAMUSCULAR | Status: DC
Start: 2017-04-15 — End: 2017-05-28
  Administered 2017-04-15: 200 [IU] via INTRAMUSCULAR

## 2017-04-15 NOTE — Progress Notes (Signed)
Alyssa Macdonald is a 72 y.o. female with an underlying medical history of hypertension, hyperlipidemia, reflux disease, trigger finger, tremor, endometriosis, and osteoarthritis, memory loss and cervical dystonia, who presents for repeat botulinum toxin injection for her cervical dystonia. She is accompanied by her daughter today. She had her last botulinum toxin injection for her cervical dystonia on 01/18/2017, at which time she did well. We kept her injection dose the same.  I saw her in the interim on 02/15/2017 for follow-up of her memory loss and we mutually agreed to increase her generic Aricept 10 mg once daily at the time.   Today, 04/15/2017:     She denies lightheadedness or dizziness. She admits that she has not had anything to eat or drink today yet. Her daughter reports that she is typically late to rise and eat in the mornings. She is encouraged to eat better and drink more fluid. he reports doing okay as far as the injections go.    The patient's allergies, current medications, family history, past medical history, past social history, past surgical history and problem list were reviewed and updated as appropriate.    She no showed for an appointment on 09/29/2016. Her last injection was on 06/25/2016, at which time she reported doing well, reported no side effects or new symptoms. She was advised to reschedule her appointment with neuropsychology. She had an appointment in the interim on 09/24/2016 with a full report pending and also follow-up pending as I understand with Dr. Bonita Quin.    O/E:   BP (!) 82/64   Pulse 84   Ht 5' 7"  (1.702 m)   Wt 194 lb (88 kg)   BMI 30.38 kg/m   She has evidence of mild right torticollis and slight left shoulder elevation. 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 She also has a voice tremor, mild and a very mild L laterocollis, all stable.   Written informed consent has previously been obtained and scanned into the patient's electronic  chart. We will re-consent if the type of botulinum toxin or the dosing or the distribution changes in the future. The patient is informed that we will use the same consent for her recurrent, most likely 3 monthly injections. I have previously talked to the patient in detail about expectations, limitations, benefits as well as potential adverse effects of botulinum toxin injections. The patient understands that the side effects are not limited to the ones I discussed, which include mouth dryness, dryness of eyes, speech and swallowing difficulties, respiratory depression or problems breathing, weakness of muscles including more distant muscles than the ones injected, flu-like symptoms, myalgias, injection site reactions such as redness, itching, swelling, pain, and infection.   200 units of botulinum toxin type A were reconstituted using preservative-free normal saline to a concentration of 10 units per 0.1 mL and drawn up into 1 mL tuberculin syringes. Lot number was [id number] and expiration date 03/2019 for both 100 unit vials, buy and bill.    The patient was situated in a chair, sitting comfortably. After preparing the areas with 70% isopropyl alcohol and using a 26 gauge 1/2 inch hollow lumen recording EMG needle, a total dose of 130 units of botulinum toxin type A in the form of Xeomin was injected into the muscles and the following distribution and quantities:   #1: 25 units in the left SCM #2: 15 units in the right SCM #3: 25 units in the left splenius capitis muscle #4: 10 units in the left semispinalis  capitis muscle   #5: 10 units in the right semispinalis capitis muscle   #6: 25 units in the left levator scapulae muscle   #7: 20 units in the left upper trapezius muscle    EMG guidance was utilized for these injections with minimal to mild EMG activity noted, especially in the levator scapulae on the left, bilateral splenius capitis muscles, bilateral SCM's, bilateral semispinalis capitis  muscles.     A dose of 70 out of a total dose of 200 units was discarded as unavoidable waste. The patient tolerated the procedure well without immediate complications. She was advised to make a followup appointment for repeat injections.    Previously (copied from previous notes for reference):    Of note, she no showed for her injection appointment on 01/13/2017 and 01/14/17. She had an injection on 10/12/2016, at which time she did well.    I saw her on 10/22/2016, at which time we talked about her brain MRI results and her neuropsych test results.    She had neuropsychological evaluation with Dr. 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 Results of the current cognitive evaluation reveal impaired verbal and non-verbal memory retrieval and consolidation as well as reduced verbal fluency. Additionally, there is evidence that her cognitive deficits are interfering with her ability to manage complex tasks, such as driving (forgetting familiar routes) and managing appointments. As such, diagnostic criteria for a dementia syndrome are met.    The patient's cognitive profile is suggestive of medial-temporal lobe involvement. Alzheimer's disease is the most likely etiology, given her cognitive profile and clinical features. The patient's stage of dementia can be characterized as mild. She retains many areas of strength on cognitive testing, including in processing speed, attention, visual-spatial construction and executive functioning.   There is no evidence of primary psychiatric disorder. Her current mood appears euthymic, and there is no behavioral disturbance.     Recommendations:   1. Treatment/Medication: The patient may be an appropriate candidate for cholinesterase inhibitor therapy. She will follow up with Dr. Rexene Alberts about this.   2. Complex ADLs: The patient would benefit from assistance  with managing her appointments (e.g., oversight of her calendar/appointments and cueing/reminders). Monitoring of her medication management is also recommended. She performed well on a cognitive test highly correlated with driving ability, but her memory impairment puts her at risk for becoming lost. She should limit her driving to local/familiar locations during the day. Oversight of her management of finances is also recommended to ensure errors are not made.    3. Education/Support: The patient's family can seek additional education and support from [employer] and Tax adviser.   4. Quality of Life/Optimization of Cognitive Functioning: The patient should continue to participate in activities which provide mental stimulation, safe cardiovascular exercise, and social interaction. She appears to be an ideal candidate for an assisted living community where she would have more support as well as more opportunities for structured social activities.   5. Neuropsychological re-assessment in 1-2 years could be considered in order to monitor cognitive status, track progression of symptoms and further assist with treatment planning.   >>   She no showed for an appointment on 09/29/2016. Her last injection was on 06/25/2016, at which time she reported doing well, reported no side effects or new symptoms. She was advised to reschedule her appointment with neuropsychology. She had an appointment in the interim on 09/24/2016  with a full report pending and also follow-up pending as I understand with Dr. Bonita Quin.    She had an injection on 03/18/2016, at which time she did well with her injection, reported no side effects.    She had an injection under Dr. Krista Blue on 12/11/2015. I have seen her in the interim on 03/11/2016 as a new problem referral for memory loss.    03/11/2016: She reports memory issues "all her life", denies issues driving, feels like she has issues with paying  attention. Mother had AD, mother was around 72 when she passed, but she does not know. Her father lived to be 69s, had emphysema. She herself does not smoke, occasional wine, drinks 2 cups of coffee in AM. Has not slept well since her husband passed in 10/14. She is still on clonazepam. I reviewed Dr. Merilyn Baba note from 12/12/15, and patient was advised to continue with propranolol, and stop the clonazepam, but continues to take it at the current dose of 0.5 mg qHS, has trazodone, but does not take it.  She endorses symptoms of depression since her husband passed away. She has had intermittent suicidal ideations, no current suicidal ideations, no plan.    She had a brain MRI with and without contrast on 10/20/2012 which I reviewed: IMPRESSION:  Slightly abnormal MRI scan of the brain showing mild changes of chronic microvascular ischemia.   She had an injection on 08/29/2015, at which time she reported doing well and she felt that the medication had worked quite well, no side effects were reported.   She had injection on 05/27/2015, at which time she reported doing fairly well. She had received her previous injection under Dr. Posey Pronto. We reviewe I reviewed her note at the time. She had an injection in January 2017 and follow-up with him in March 2017. She reported no side effects. She was agreeable with pursuing her previous injections that I did.    She had an injection with me on 10/24/2014, at which time she received a total dose of 130 units of Xeomin.    She received an injection under Dr. Posey Pronto with physical rehabilitation 02/15/2015 for total dose of 160 units. She was seen in follow-up in March 2017. I reviewed the office note and procedure note from Dr. Posey Pronto.    Her overall seventh injection was on 07/24/2014, at which time she received a total dose of 130 units of Xeomin.    Her 6th injection was on 04/19/2014, at which time she received a total dose of 130 units of Xeomin.    Her 5th  injection was on 01/19/2014, at which time she was re-consented her as this was the first injection with me.    She previously followed with Dr. Janann Colonel and her last injection with him was on 10/11/2013 (4th), at which time she received a total dose of 135 units of botulinum toxin type A in the form of Xeomin.    Her 3rd injection was on 07/04/2013 at which time she reported good benefit to her symptoms and no adverse effects. She felt it wore off after several weeks, approximately 2-3 weeks before she was due for another injection.    Her first injection was on 12/22/2013 and her second injection was on 03/24/2013. The condition has existed for more than 6 months, and pt does not have a diagnosis of ALS, Myasthenia Gravis or Lambert-Eaton Syndrome.

## 2017-04-15 NOTE — Patient Instructions (Signed)
As discussed, botulinum toxin takes about 3-7 days to kick in. Please remember, this is not a pain shot, this is to gradually improve your symptoms. In some patients it takes up to 2-3 weeks to make a difference and it wears off with time. Sometimes it may wear off before it is time for the next injection. We still should wait till the next 3 monthly injection, because injecting too frequently may cause you to develop immunity to the botulinum toxin. We are looking for a reduction in your headache frequency and headache severity. Side effects to look out for are mouth dryness, dryness of the eyes, heaviness of your head or muscle weakness, rarely, speech or swallowing difficulties and very rarely breathing difficulties. Some people have transient neck pain or soreness which typically responds to over-the-counter anti-inflammatory medication and local heat application with a heat pad. If you think you have a severe reaction to the botulinum toxin, such as weakness, trouble speaking, trouble breathing, or trouble swallowing, you have to call 911 or have someone take you to the nearest emergency room. However, most people have no side effects from the injections. It is normal to have a little bit of redness and swelling around the injection sites which usually improves after a few hours. Rarely, there may be a bruise that improves on its own. Most side effects reported are very mild and resolve within 10-14 days. Please feel free to call us if you have any additional questions or concerns: [phone].

## 2017-05-24 ENCOUNTER — Encounter (HOSPITAL_COMMUNITY): Payer: Self-pay | Admitting: Emergency Medicine

## 2017-05-24 ENCOUNTER — Inpatient Hospital Stay (HOSPITAL_COMMUNITY)
Admission: EM | Admit: 2017-05-24 | Discharge: 2017-05-28 | DRG: 682 | Disposition: A | Discharge status: Skilled Nursing Facility | Payer: Medicare Other | Attending: Internal Medicine | Admitting: Internal Medicine

## 2017-05-24 DIAGNOSIS — I10 Essential (primary) hypertension: Secondary | ICD-10-CM | POA: Diagnosis present

## 2017-05-24 DIAGNOSIS — N3090 Cystitis, unspecified without hematuria: Secondary | ICD-10-CM

## 2017-05-24 DIAGNOSIS — F419 Anxiety disorder, unspecified: Secondary | ICD-10-CM | POA: Diagnosis present

## 2017-05-24 DIAGNOSIS — E785 Hyperlipidemia, unspecified: Secondary | ICD-10-CM | POA: Diagnosis present

## 2017-05-24 DIAGNOSIS — N390 Urinary tract infection, site not specified: Secondary | ICD-10-CM | POA: Diagnosis present

## 2017-05-24 DIAGNOSIS — Z96653 Presence of artificial knee joint, bilateral: Secondary | ICD-10-CM | POA: Diagnosis present

## 2017-05-24 DIAGNOSIS — G9341 Metabolic encephalopathy: Secondary | ICD-10-CM | POA: Diagnosis present

## 2017-05-24 DIAGNOSIS — K219 Gastro-esophageal reflux disease without esophagitis: Secondary | ICD-10-CM | POA: Diagnosis present

## 2017-05-24 DIAGNOSIS — N179 Acute kidney failure, unspecified: Principal | ICD-10-CM | POA: Diagnosis present

## 2017-05-24 DIAGNOSIS — B9620 Unspecified Escherichia coli [E. coli] as the cause of diseases classified elsewhere: Secondary | ICD-10-CM | POA: Diagnosis present

## 2017-05-24 DIAGNOSIS — Z66 Do not resuscitate: Secondary | ICD-10-CM | POA: Diagnosis present

## 2017-05-24 DIAGNOSIS — E876 Hypokalemia: Secondary | ICD-10-CM | POA: Diagnosis present

## 2017-05-24 DIAGNOSIS — J383 Other diseases of vocal cords: Secondary | ICD-10-CM | POA: Diagnosis present

## 2017-05-24 DIAGNOSIS — G9340 Encephalopathy, unspecified: Secondary | ICD-10-CM | POA: Diagnosis present

## 2017-05-24 DIAGNOSIS — F0390 Unspecified dementia without behavioral disturbance: Secondary | ICD-10-CM | POA: Diagnosis present

## 2017-05-24 DIAGNOSIS — Z79899 Other long term (current) drug therapy: Secondary | ICD-10-CM

## 2017-05-24 DIAGNOSIS — G250 Essential tremor: Secondary | ICD-10-CM | POA: Diagnosis present

## 2017-05-24 DIAGNOSIS — R42 Dizziness and giddiness: Secondary | ICD-10-CM

## 2017-05-24 LAB — URINALYSIS, MICROSCOPIC (REFLEX)

## 2017-05-24 LAB — BASIC METABOLIC PANEL
Anion gap: 14 (ref 5–15)
BUN: 20 mg/dL (ref 6–20)
CO2: 29 mmol/L (ref 22–32)
CREATININE: 1.33 mg/dL — AB (ref 0.44–1.00)
Calcium: 8.7 mg/dL — ABNORMAL LOW (ref 8.9–10.3)
Chloride: 89 mmol/L — ABNORMAL LOW (ref 101–111)
GFR, EST AFRICAN AMERICAN: 45 mL/min — AB (ref >60)
GFR, EST NON AFRICAN AMERICAN: 39 mL/min — AB (ref >60)
Glucose, Bld: 144 mg/dL — ABNORMAL HIGH (ref 65–99)
POTASSIUM: 2.5 mmol/L — AB (ref 3.5–5.1)
SODIUM: 132 mmol/L — AB (ref 135–145)

## 2017-05-24 LAB — URINALYSIS, ROUTINE W REFLEX MICROSCOPIC
GLUCOSE, UA: NEGATIVE mg/dL
KETONES UR: NEGATIVE mg/dL
NITRITE: NEGATIVE
PROTEIN: 100 mg/dL — AB
Specific Gravity, Urine: 1.015 (ref 1.005–1.030)
pH: 6 (ref 5.0–8.0)

## 2017-05-24 LAB — CBC
HCT: 41.1 % (ref 36.0–46.0)
Hemoglobin: 13.9 g/dL (ref 12.0–15.0)
MCH: 30.1 pg (ref 26.0–34.0)
MCHC: 33.8 g/dL (ref 30.0–36.0)
MCV: 89 fL (ref 78.0–100.0)
PLATELETS: 604 10*3/uL — AB (ref 150–400)
RBC: 4.62 MIL/uL (ref 3.87–5.11)
RDW: 13 % (ref 11.5–15.5)
WBC: 14.9 10*3/uL — AB (ref 4.0–10.5)

## 2017-05-24 LAB — CBG MONITORING, ED: GLUCOSE-CAPILLARY: 125 mg/dL — AB (ref 65–99)

## 2017-05-24 MED ORDER — POTASSIUM CHLORIDE CRYS ER 20 MEQ PO TBCR
40.0000 meq | EXTENDED_RELEASE_TABLET | Freq: Once | ORAL | Status: AC
  Administered 2017-05-25: 40 meq via ORAL
  Filled 2017-05-24: qty 2

## 2017-05-24 NOTE — ED Triage Notes (Signed)
Patient BIB son, reports patient became dizzy with nausea x10 days ago. Hx dementia. States they were seen at PCP and prescribed anti nausea medication. Today c/o continued dizziness with movement. Son reports patient has not been walking. Denies N/V/D, chest pain, SOB. A&Ox2.

## 2017-05-25 ENCOUNTER — Other Ambulatory Visit: Payer: Self-pay

## 2017-05-25 ENCOUNTER — Inpatient Hospital Stay (HOSPITAL_COMMUNITY): Payer: Medicare Other

## 2017-05-25 DIAGNOSIS — K219 Gastro-esophageal reflux disease without esophagitis: Secondary | ICD-10-CM | POA: Diagnosis present

## 2017-05-25 DIAGNOSIS — G9340 Encephalopathy, unspecified: Secondary | ICD-10-CM | POA: Diagnosis present

## 2017-05-25 DIAGNOSIS — N179 Acute kidney failure, unspecified: Secondary | ICD-10-CM | POA: Diagnosis present

## 2017-05-25 DIAGNOSIS — G250 Essential tremor: Secondary | ICD-10-CM | POA: Diagnosis present

## 2017-05-25 DIAGNOSIS — F0390 Unspecified dementia without behavioral disturbance: Secondary | ICD-10-CM | POA: Diagnosis not present

## 2017-05-25 DIAGNOSIS — I10 Essential (primary) hypertension: Secondary | ICD-10-CM | POA: Diagnosis present

## 2017-05-25 DIAGNOSIS — N3090 Cystitis, unspecified without hematuria: Secondary | ICD-10-CM | POA: Diagnosis present

## 2017-05-25 DIAGNOSIS — N390 Urinary tract infection, site not specified: Secondary | ICD-10-CM | POA: Diagnosis present

## 2017-05-25 DIAGNOSIS — F0280 Dementia in other diseases classified elsewhere without behavioral disturbance: Secondary | ICD-10-CM | POA: Diagnosis not present

## 2017-05-25 DIAGNOSIS — R42 Dizziness and giddiness: Secondary | ICD-10-CM

## 2017-05-25 DIAGNOSIS — Z79899 Other long term (current) drug therapy: Secondary | ICD-10-CM | POA: Diagnosis not present

## 2017-05-25 DIAGNOSIS — Z96653 Presence of artificial knee joint, bilateral: Secondary | ICD-10-CM | POA: Diagnosis present

## 2017-05-25 DIAGNOSIS — J383 Other diseases of vocal cords: Secondary | ICD-10-CM

## 2017-05-25 DIAGNOSIS — B9620 Unspecified Escherichia coli [E. coli] as the cause of diseases classified elsewhere: Secondary | ICD-10-CM | POA: Diagnosis present

## 2017-05-25 DIAGNOSIS — E876 Hypokalemia: Secondary | ICD-10-CM | POA: Diagnosis present

## 2017-05-25 DIAGNOSIS — N3000 Acute cystitis without hematuria: Secondary | ICD-10-CM | POA: Diagnosis not present

## 2017-05-25 DIAGNOSIS — Z66 Do not resuscitate: Secondary | ICD-10-CM | POA: Diagnosis present

## 2017-05-25 DIAGNOSIS — F419 Anxiety disorder, unspecified: Secondary | ICD-10-CM

## 2017-05-25 DIAGNOSIS — E785 Hyperlipidemia, unspecified: Secondary | ICD-10-CM | POA: Diagnosis present

## 2017-05-25 DIAGNOSIS — G9341 Metabolic encephalopathy: Secondary | ICD-10-CM | POA: Diagnosis present

## 2017-05-25 LAB — SODIUM, URINE, RANDOM: Sodium, Ur: <10 mmol/L

## 2017-05-25 LAB — URINALYSIS, ROUTINE W REFLEX MICROSCOPIC
Bilirubin Urine: NEGATIVE
Glucose, UA: NEGATIVE mg/dL
Ketones, ur: NEGATIVE mg/dL
NITRITE: NEGATIVE
Specific Gravity, Urine: 1.015 (ref 1.005–1.030)
pH: 6 (ref 5.0–8.0)

## 2017-05-25 LAB — MAGNESIUM: Magnesium: 1.8 mg/dL (ref 1.7–2.4)

## 2017-05-25 LAB — CBC
HEMATOCRIT: 36.7 % (ref 36.0–46.0)
Hemoglobin: 12.3 g/dL (ref 12.0–15.0)
MCH: 29.5 pg (ref 26.0–34.0)
MCHC: 33.5 g/dL (ref 30.0–36.0)
MCV: 88 fL (ref 78.0–100.0)
PLATELETS: 435 10*3/uL — AB (ref 150–400)
RBC: 4.17 MIL/uL (ref 3.87–5.11)
RDW: 12.8 % (ref 11.5–15.5)
WBC: 12.2 10*3/uL — AB (ref 4.0–10.5)

## 2017-05-25 LAB — URINALYSIS, MICROSCOPIC (REFLEX): SQUAMOUS EPITHELIAL / LPF: NONE SEEN (ref 0–5)

## 2017-05-25 LAB — BASIC METABOLIC PANEL
Anion gap: 12 (ref 5–15)
BUN: 17 mg/dL (ref 6–20)
CO2: 26 mmol/L (ref 22–32)
CREATININE: 1.1 mg/dL — AB (ref 0.44–1.00)
Calcium: 7.9 mg/dL — ABNORMAL LOW (ref 8.9–10.3)
Chloride: 97 mmol/L — ABNORMAL LOW (ref 101–111)
GFR, EST AFRICAN AMERICAN: 56 mL/min — AB (ref >60)
GFR, EST NON AFRICAN AMERICAN: 49 mL/min — AB (ref >60)
Glucose, Bld: 99 mg/dL (ref 65–99)
POTASSIUM: 2.8 mmol/L — AB (ref 3.5–5.1)
Sodium: 135 mmol/L (ref 135–145)

## 2017-05-25 LAB — CBG MONITORING, ED: Glucose-Capillary: 72 mg/dL (ref 65–99)

## 2017-05-25 LAB — LACTIC ACID, PLASMA
LACTIC ACID, VENOUS: 1.7 mmol/L (ref 0.5–1.9)
Lactic Acid, Venous: 1.1 mmol/L (ref 0.5–1.9)

## 2017-05-25 LAB — CREATININE, URINE, RANDOM: Creatinine, Urine: 188.26 mg/dL

## 2017-05-25 LAB — PROCALCITONIN: PROCALCITONIN: 0.26 ng/mL

## 2017-05-25 MED ORDER — SODIUM CHLORIDE 0.9 % IV SOLN: 1.0000 g | INTRAVENOUS | Status: DC

## 2017-05-25 MED ORDER — SODIUM CHLORIDE 0.9 % IV SOLN
1.0000 g | INTRAVENOUS | Status: DC
  Administered 2017-05-26 – 2017-05-27 (×2): 1 g via INTRAVENOUS
  Filled 2017-05-25 (×2): qty 1

## 2017-05-25 MED ORDER — SODIUM CHLORIDE 0.9 % IV SOLN: INTRAVENOUS | Status: DC

## 2017-05-25 MED ORDER — HYDRALAZINE HCL 20 MG/ML IJ SOLN: 5.0000 mg | INTRAMUSCULAR | Status: DC | PRN

## 2017-05-25 MED ORDER — MECLIZINE HCL 25 MG PO TABS: 12.5000 mg | ORAL_TABLET | Freq: Three times a day (TID) | ORAL | Status: DC | PRN

## 2017-05-25 MED ORDER — SODIUM CHLORIDE 0.9 % IV BOLUS
500.0000 mL | Freq: Once | INTRAVENOUS | Status: AC
  Administered 2017-05-25: 500 mL via INTRAVENOUS

## 2017-05-25 MED ORDER — SODIUM CHLORIDE 0.9 % IV SOLN
INTRAVENOUS | Status: DC
  Administered 2017-05-25: 05:00:00 via INTRAVENOUS

## 2017-05-25 MED ORDER — ACETAMINOPHEN 325 MG PO TABS
650.0000 mg | ORAL_TABLET | Freq: Four times a day (QID) | ORAL | Status: DC | PRN
  Administered 2017-05-25: 650 mg via ORAL
  Filled 2017-05-25 (×2): qty 2

## 2017-05-25 MED ORDER — ONDANSETRON HCL 4 MG/2ML IJ SOLN: 4.0000 mg | Freq: Three times a day (TID) | INTRAMUSCULAR | Status: DC | PRN

## 2017-05-25 MED ORDER — PROPRANOLOL HCL ER 60 MG PO CP24
60.0000 mg | ORAL_CAPSULE | Freq: Every day | ORAL | Status: DC
  Administered 2017-05-26: 60 mg via ORAL
  Filled 2017-05-25 (×3): qty 1

## 2017-05-25 MED ORDER — DONEPEZIL HCL 10 MG PO TABS
10.0000 mg | ORAL_TABLET | Freq: Every day | ORAL | Status: DC
  Administered 2017-05-25 – 2017-05-27 (×3): 10 mg via ORAL
  Filled 2017-05-25 (×2): qty 1
  Filled 2017-05-25: qty 2
  Filled 2017-05-25: qty 1

## 2017-05-25 MED ORDER — POTASSIUM CHLORIDE 10 MEQ/100ML IV SOLN
10.0000 meq | Freq: Once | INTRAVENOUS | Status: AC
  Administered 2017-05-25: 10 meq via INTRAVENOUS
  Filled 2017-05-25: qty 100

## 2017-05-25 MED ORDER — ZOLPIDEM TARTRATE 5 MG PO TABS: 5.0000 mg | ORAL_TABLET | Freq: Every evening | ORAL | Status: DC | PRN

## 2017-05-25 MED ORDER — TRAZODONE HCL 50 MG PO TABS: 50.0000 mg | ORAL_TABLET | Freq: Every day | ORAL | Status: DC

## 2017-05-25 MED ORDER — POTASSIUM CHLORIDE IN NACL 40-0.9 MEQ/L-% IV SOLN
INTRAVENOUS | Status: DC
  Administered 2017-05-25: 100 mL/h via INTRAVENOUS
  Administered 2017-05-26: 75 mL/h via INTRAVENOUS
  Filled 2017-05-25 (×2): qty 1000

## 2017-05-25 MED ORDER — SODIUM CHLORIDE 0.9 % IV BOLUS
1000.0000 mL | Freq: Once | INTRAVENOUS | Status: AC
  Administered 2017-05-25: 1000 mL via INTRAVENOUS

## 2017-05-25 MED ORDER — LACTATED RINGERS IV BOLUS
1000.0000 mL | Freq: Once | INTRAVENOUS | Status: AC
  Administered 2017-05-25: 1000 mL via INTRAVENOUS

## 2017-05-25 MED ORDER — QUETIAPINE FUMARATE 25 MG PO TABS: 25.0000 mg | ORAL_TABLET | Freq: Every evening | ORAL | Status: DC | PRN

## 2017-05-25 MED ORDER — CENTRUM PO CHEW: 1.0000 | CHEWABLE_TABLET | Freq: Every day | ORAL | Status: DC

## 2017-05-25 MED ORDER — HALOPERIDOL LACTATE 5 MG/ML IJ SOLN: 2.0000 mg | Freq: Four times a day (QID) | INTRAMUSCULAR | Status: DC | PRN

## 2017-05-25 MED ORDER — SODIUM CHLORIDE 0.9 % IV SOLN
1.0000 g | Freq: Once | INTRAVENOUS | Status: AC
  Administered 2017-05-25: 1 g via INTRAVENOUS
  Filled 2017-05-25: qty 10

## 2017-05-25 MED ORDER — POTASSIUM CHLORIDE CRYS ER 20 MEQ PO TBCR
40.0000 meq | EXTENDED_RELEASE_TABLET | Freq: Once | ORAL | Status: AC
  Administered 2017-05-25: 40 meq via ORAL
  Filled 2017-05-25: qty 2

## 2017-05-25 MED ORDER — TAB-A-VITE/IRON PO TABS
1.0000 | ORAL_TABLET | Freq: Every day | ORAL | Status: DC
  Administered 2017-05-26 – 2017-05-28 (×3): 1 via ORAL
  Filled 2017-05-25 (×4): qty 1

## 2017-05-25 MED ORDER — MAGNESIUM SULFATE IN D5W 1-5 GM/100ML-% IV SOLN
1.0000 g | Freq: Once | INTRAVENOUS | Status: AC
  Administered 2017-05-25: 1 g via INTRAVENOUS
  Filled 2017-05-25: qty 100

## 2017-05-25 MED ORDER — SODIUM CHLORIDE 0.9 % IV BOLUS: 500.0000 mL | Freq: Once | INTRAVENOUS | Status: DC

## 2017-05-25 MED ORDER — ENOXAPARIN SODIUM 40 MG/0.4ML ~~LOC~~ SOLN
40.0000 mg | Freq: Every day | SUBCUTANEOUS | Status: DC
  Administered 2017-05-25 – 2017-05-28 (×4): 40 mg via SUBCUTANEOUS
  Filled 2017-05-25 (×4): qty 0.4

## 2017-05-25 MED ORDER — SENNOSIDES-DOCUSATE SODIUM 8.6-50 MG PO TABS
1.0000 | ORAL_TABLET | Freq: Every evening | ORAL | Status: DC | PRN
  Administered 2017-05-26: 1 via ORAL
  Filled 2017-05-25: qty 1

## 2017-05-25 NOTE — H&P (Signed)
History and Physical    Alyssa Macdonald:[id number] DOB: 19-Dec-1945 DOA: 05/24/2017  Referring MD/NP/PA:   PCP: Bartholome Bill, MD   Patient coming from:  The patient is coming from home.  At baseline, pt is partially dependent for most of ADL.  Chief Complaint: AMS, dizziness  HPI: Alyssa Macdonald is a 72 y.o. female with medical history significant of hypertension, hyperlipidemia, GERD, anxiety, specimen taken dystonia, endometriosis, essential tremor, dementia, obesity, who presents with altered mental status and dizziness.  Per patient's son, patient has been more confused than her baseline in the past to 10 days. Patient has dizziness and poor balance. She has nausea, no vomiting, diarrhea or abdominal pain. Patient moves all extremities, no unilateral weakness, numbness or tingling in extremities. No facial droop, slurred speech, vision change or hearing loss. Patient does not have chest pain, shortness breath, cough. No fever or chills. Not sure if patient has symptoms of UTI, but no increased urinary frequency noted.  ED Course: pt was found to have positive urinalysis with large amount of leukocyte, WBC 14.9, lactic acid 1.1, potassium 2.5, acute renal injury with creatinine 1.33, temperature normal, no tachycardia, no tachypnea, oxygen saturation 96% on room air. CT head is negative for acute intracranial abnormalities.  Review of Systems: could not be reviewed due to altered mental status and dementia.  Allergy:  Allergies  Allergen Reactions  . Zocor [Simvastatin]     Myalgia    Past Medical History:  Diagnosis Date  . Cervical dystonia   . Complication of anesthesia    slow to awaken  . Endometriosis   . Esophageal reflux   . Essential and other specified forms of tremor   . GERD (gastroesophageal reflux disease)   . Hyperlipidemia   . Hypertension   . Hypopotassemia   . Osteoarthrosis involving, or with mention of more than one site, but not specified as  generalized, site unspecified(715.80)   . Osteoarthrosis involving, or with mention of more than one site, but not specified as generalized, site unspecified(715.80)   . 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 Trigger finger     Past Surgical History:  Procedure Laterality Date  . ABDOMINAL HYSTERECTOMY    . APPENDECTOMY    . CHOLECYSTECTOMY    . COLON SURGERY     bowel resection  . FOOT SURGERY    . JOINT REPLACEMENT     bilateral  . TONSILLECTOMY    . TOTAL KNEE ARTHROPLASTY     Bilateral  . TRIGGER FINGER RELEASE  02/01/2012   Procedure: RELEASE TRIGGER FINGER/A-1 PULLEY;  Surgeon: Jolyn Nap, MD;  Location: [facility];  Service: Orthopedics;  Laterality: Right;  RIGHT LONG FINGER  TRIGGER FINGER RELEASE    Social History:  reports that she has never smoked. She has never used smokeless tobacco. She reports that she drinks alcohol. She reports that she does not use drugs.  Family History:  Family History  Problem Relation Age of Onset  . CVA Mother   . Diabetes Father   . Parkinson's disease Paternal Uncle   . Cataracts Sister      Prior to Admission medications   Medication Sig Start Date End Date Taking? Authorizing Provider  clonazePAM (KLONOPIN) 0.5 MG tablet Take 0.5 mg by mouth. 04/01/15   [provider]  donepezil (ARICEPT) 10 MG tablet Take 1 tablet (10 mg total) by mouth at bedtime. 02/15/17   Star Age, MD  incobotulinumtoxinA Su Hoff) 100  units SOLR injection Inject into the muscle. 07/04/13   [provider]  multivitamin-iron-minerals-folic acid (CENTRUM) chewable tablet Chew 1 tablet by mouth daily.    [provider]  propranolol ER (INDERAL LA) 60 MG 24 hr capsule TAKE ONE CAPSULE BY MOUTH ONE TIME DAILY 08/13/15   Star Age, MD  traZODone (DESYREL) 50 MG tablet Take 50 mg by mouth. 05/23/15   [provider]    Physical Exam: Vitals:   05/25/17 0300 05/25/17 0400 05/25/17 0407  05/25/17 0749  BP: 110/68 131/78 108/65 (!) 95/49  Pulse: 65 68 65 77  Resp: 16 13 18 16   Temp:    (!) 97.4 F (36.3 C)  TempSrc:    Oral  SpO2: 94% 96% 98% 90%  Weight:      Height:       General: Not in acute distress HEENT:       Eyes: PERRL, EOMI, no scleral icterus.       ENT: No discharge from the ears and nose, no pharynx injection, no tonsillar enlargement.        Neck: No JVD, no bruit, no mass felt. Heme: No neck lymph node enlargement. Cardiac: S1/S2, RRR, No murmurs, No gallops or rubs. Respiratory: No rales, wheezing, rhonchi or rubs. GI: Soft, nondistended, nontender, no rebound pain, no organomegaly, BS present. GU: No hematuria Ext: No pitting leg edema bilaterally. 2+DP/PT pulse bilaterally. Musculoskeletal: No joint deformities, No joint redness or warmth, no limitation of ROM in spin. Skin: No rashes.  Neuro: confused, oriented to place and person, but not to time, cranial nerves II-XII grossly intact, moves all extremities normally. Muscle strength 5/5 in all extremities, sensation to light touch intact. Brachial reflex 2+ bilaterally.. Negative Babinski's sign.  Psych: Patient is not psychotic, no suicidal or hemocidal ideation.  Labs on Admission: I have personally reviewed following labs and imaging studies  CBC: Recent Labs  Lab 05/24/17 2117 05/25/17 0613  WBC 14.9* 12.2*  HGB 13.9 12.3  HCT 41.1 36.7  MCV 89.0 88.0  PLT 604* 161*   Basic Metabolic Panel: Recent Labs  Lab 05/24/17 2117 05/25/17 0613  NA 132* 135  K 2.5* 2.8*  CL 89* 97*  CO2 29 26  GLUCOSE 144* 99  BUN 20 17  CREATININE 1.33* 1.10*  CALCIUM 8.7* 7.9*  MG  --  1.8   GFR: Estimated Creatinine Clearance: 49.2 mL/min (A) (by C-G formula based on SCr of 1.1 mg/dL (H)). Liver Function Tests: No results for input(s): AST, ALT, ALKPHOS, BILITOT, PROT, ALBUMIN in the last 168 hours. No results for input(s): LIPASE, AMYLASE in the last 168 hours. No results for input(s):  AMMONIA in the last 168 hours. Coagulation Profile: No results for input(s): INR, PROTIME in the last 168 hours. Cardiac Enzymes: No results for input(s): CKTOTAL, CKMB, CKMBINDEX, TROPONINI in the last 168 hours. BNP (last 3 results) No results for input(s): PROBNP in the last 8760 hours. HbA1C: No results for input(s): HGBA1C in the last 72 hours. CBG: Recent Labs  Lab 05/24/17 2331  GLUCAP 125*   Lipid Profile: No results for input(s): CHOL, HDL, LDLCALC, TRIG, CHOLHDL, LDLDIRECT in the last 72 hours. Thyroid Function Tests: No results for input(s): TSH, T4TOTAL, FREET4, T3FREE, THYROIDAB in the last 72 hours. Anemia Panel: No results for input(s): VITAMINB12, FOLATE, FERRITIN, TIBC, IRON, RETICCTPCT in the last 72 hours. Urine analysis:    Component Value Date/Time   COLORURINE YELLOW 05/25/2017 0124   APPEARANCEUR TURBID (A) 05/25/2017 0124  LABSPEC 1.015 05/25/2017 0124   PHURINE 6.0 05/25/2017 0124   GLUCOSEU NEGATIVE 05/25/2017 0124   HGBUR LARGE (A) 05/25/2017 0124   BILIRUBINUR NEGATIVE 05/25/2017 0124   BILIRUBINUR negative 04/18/2013 1223   KETONESUR NEGATIVE 05/25/2017 0124   PROTEINUR >300 (A) 05/25/2017 0124   UROBILINOGEN negative 04/18/2013 1223   UROBILINOGEN 1.0 01/31/2008 0717   NITRITE NEGATIVE 05/25/2017 0124   LEUKOCYTESUR LARGE (A) 05/25/2017 0124   Sepsis Labs: @LABRCNTIP (procalcitonin:4,lacticidven:4) )No results found for this or any previous visit (from the past 240 hour(s)).   Radiological Exams on Admission: Ct Head Wo Contrast  Result Date: 05/25/2017 CLINICAL DATA:  Acute onset of dizziness and nausea. EXAM: CT HEAD WITHOUT CONTRAST TECHNIQUE: Contiguous axial images were obtained from the base of the skull through the vertex without intravenous contrast. COMPARISON:  MRI of the brain performed 03/27/2016 FINDINGS: Brain: No evidence of acute infarction, hemorrhage, hydrocephalus, extra-axial collection or mass lesion / mass effect.  Prominence of the ventricles and sulci reflects mild cortical volume loss. Mild cerebellar atrophy is noted. Scattered periventricular and subcortical matter change likely reflects small vessel ischemic microangiopathy. The brainstem and fourth ventricle are within normal limits. The basal ganglia are unremarkable in appearance. The cerebral hemispheres demonstrate grossly normal gray-white differentiation. No mass effect or midline shift is seen. Vascular: No hyperdense vessel or unexpected calcification. Skull: There is no evidence of fracture; visualized osseous structures are unremarkable in appearance. Sinuses/Orbits: The orbits are within normal limits. The paranasal sinuses and mastoid air cells are well-aerated. Other: No significant soft tissue abnormalities are seen. IMPRESSION: 1. No acute intracranial pathology seen on CT. 2. Mild cortical volume loss and scattered small vessel ischemic microangiopathy. Electronically Signed   By: Garald Balding M.D.   On: 05/25/2017 04:44     EKG: Independently reviewed.  Sinus rhythm, QTC 493, LAD, poor R-wave progression  Assessment/Plan Principal Problem:   UTI (urinary tract infection) Active Problems:   Spastic dysphonia   Acute metabolic encephalopathy   Essential hypertension   Anxiety   Dementia   Hypokalemia   Dizziness   UTI (urinary tract infection):   -Admitted to telemetry bed as inpatient -IVF Rocephin -Blood culture and urine culture -IV fluids: 1 L normal saline and 1 L of Ringer's solution, then 125 cc/h of NS  Acute metabolic encephalopathy: likely due to UTI. CT has negative for acute intracranial abnormalities. No focal neurological findings on physical examination. -Frequent neuro check  Dementia: no behavioral disturbance -Namenda, donepezil  Anxiety: -hold Klonopin due to altered mental status  Spastic dysphonia: =patient is getting Botoxin injection every 3 months  HTN:  -Continue home  medications:propranolol -IV hydralazine prn  Dizziness: likely due to UTI, Other differential diagnosis is orthostatic vital status. No focal neurological findings on physical examination, less likely to have stroke. -PT/OT -Check orthostatic status -IV fluid as above -consult CM - prn meclizine  Hypokalemia: K=2.5  on admission. - Repleted - give 1 Gram of magnesium sulfate - Check Mg level   DVT ppx: SQ Lovenox Code Status: per her son, pt would wants to be DNR Family Communication:  Yes, patient's  son  at bed side Disposition Plan:  Anticipate discharge back to previous home environment Consults called:  none Admission status:  Inpatient/tele     Date of Service 05/25/2017    Ivor Costa [facility] Pager [phone]  If 7PM-7AM, please contact night-coverage [email or web address] Password [email or web address] 05/25/2017, 8:20 AM

## 2017-05-25 NOTE — ED Notes (Signed)
Informed attending nurse of vitals being out of parameters

## 2017-05-25 NOTE — Progress Notes (Signed)
PT Cancellation Note  Patient Details Name: Alyssa Macdonald MRN: [id number] DOB: July 19, 1945   Cancelled Treatment:    Reason Eval/Treat Not Completed: Other (comment).  Pt was seen for attempted eval and her BP in bed was 58/38, talked with nursing and will try again tomorrow.   Ramond Dial 05/25/2017, 6:51 PM   Mee Hives, PT MS Acute Rehab Dept. Number: [place]

## 2017-05-25 NOTE — ED Provider Notes (Signed)
[facility] DEPT Provider Note   CSN: [id number] Arrival date & time: 05/24/17  2023     History   Chief Complaint Chief Complaint  Patient presents with  . Dizziness    HPI Alyssa Macdonald is a 72 y.o. female.  HPI 73 year old female brought into the ER by son with chief complaint of dizziness and nausea into 10 days.  Level 5 caveat for dementia.  Patient has history of hypertension, hyperlipidemia and early onset dementia.  Patient lives by herself and is independent.  According to the son patient has declined quickly over the past 7-10 days.  Patient specifically is having difficulty with walking and requires assistance with any kind of activity of daily living -which is unusual.  Patient was seen by her primary care doctor's urgent care, and they advised that patient come to the ER for further workup.  There is no history of falls, strokes, new numbness or tingling or weakness that is focal.  Past Medical History:  Diagnosis Date  . Cervical dystonia   . Complication of anesthesia    slow to awaken  . Endometriosis   . Esophageal reflux   . Essential and other specified forms of tremor   . GERD (gastroesophageal reflux disease)   . Hyperlipidemia   . Hypertension   . Hypopotassemia   . Osteoarthrosis involving, or with mention of more than one site, but not specified as generalized, site unspecified(715.80)   . Osteoarthrosis involving, or with mention of more than one site, but not specified as generalized, site unspecified(715.80)   . 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 Trigger finger     Patient Active Problem List   Diagnosis Date Noted  . 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 Abnormal urine odor 06/18/2013  . Foul smelling urine 06/18/2013  . Vaginitis and vulvovaginitis, unspecified 06/18/2013  . Need for prophylactic vaccination and inoculation against influenza 12/11/2012  . Knee pain, chronic 12/11/2012  . GERD  (gastroesophageal reflux disease) 12/11/2012  . Other and unspecified hyperlipidemia 12/11/2012  . Stress due to illness of family member 11/05/2012  . 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 Obesity (BMI 30-39.9) 10/11/2012  . Tremors of nervous system 09/10/2012  . Other malaise and fatigue 09/10/2012  . Leg cramps 09/10/2012  . Osteopenia 09/10/2012    Past Surgical History:  Procedure Laterality Date  . ABDOMINAL HYSTERECTOMY    . APPENDECTOMY    . CHOLECYSTECTOMY    . COLON SURGERY     bowel resection  . FOOT SURGERY    . JOINT REPLACEMENT     bilateral  . TONSILLECTOMY    . TOTAL KNEE ARTHROPLASTY     Bilateral  . TRIGGER FINGER RELEASE  02/01/2012   Procedure: RELEASE TRIGGER FINGER/A-1 PULLEY;  Surgeon: Jolyn Nap, MD;  Location: [facility];  Service: Orthopedics;  Laterality: Right;  RIGHT LONG FINGER  TRIGGER FINGER RELEASE     OB History   None      Home Medications    Prior to Admission medications   Medication Sig Start Date End Date Taking? Authorizing Provider  clonazePAM (KLONOPIN) 0.5 MG tablet Take 0.5 mg by mouth. 04/01/15   [provider]  donepezil (ARICEPT) 10 MG tablet Take 1 tablet (10 mg total) by mouth at bedtime. 02/15/17   Star Age, MD  incobotulinumtoxinA (XEOMIN) 100 units SOLR injection Inject into the muscle. 07/04/13   [provider]  multivitamin-iron-minerals-folic acid (  CENTRUM) chewable tablet Chew 1 tablet by mouth daily.    [provider]  propranolol ER (INDERAL LA) 60 MG 24 hr capsule TAKE ONE CAPSULE BY MOUTH ONE TIME DAILY 08/13/15   Star Age, MD  traZODone (DESYREL) 50 MG tablet Take 50 mg by mouth. 05/23/15   [provider]    Family History Family History  Problem Relation Age of Onset  . CVA Mother   . Diabetes Father   . Parkinson's disease Paternal Uncle   . Cataracts Sister     Social History Social History   Tobacco Use    . Smoking status: Never Smoker  . Smokeless tobacco: Never Used  Substance Use Topics  . Alcohol use: Yes    Comment: Occasional,one glass of wine weekly  . Drug use: No     Allergies   Zocor [simvastatin]   Review of Systems Review of Systems  Unable to perform ROS: Dementia     Physical Exam Updated Vital Signs BP 103/60   Pulse 64   Temp 98 F (36.7 C) (Oral)   Resp 18   Ht 5\' 7"  (1.702 m)   Wt 78.5 kg (173 lb)   SpO2 96%   BMI 27.10 kg/m   Physical Exam  Constitutional: She appears well-developed.  HENT:  Head: Normocephalic and atraumatic.  Eyes: EOM are normal.  Neck: Normal range of motion. Neck supple.  Cardiovascular: Normal rate.  Pulmonary/Chest: Effort normal.  Abdominal: Bowel sounds are normal.  Neurological: She is alert. No cranial nerve deficit or sensory deficit. Coordination normal.  I ambulated the patient and she did not have ataxia, however son reports that her gait was not normal  Skin: Skin is warm and dry.  Nursing note and vitals reviewed.    ED Treatments / Results  Labs (all labs ordered are listed, but only abnormal results are displayed) Labs Reviewed  BASIC METABOLIC PANEL - Abnormal; Notable for the following components:      Result Value   Sodium 132 (*)    Potassium 2.5 (*)    Chloride 89 (*)    Glucose, Bld 144 (*)    Creatinine, Ser 1.33 (*)    Calcium 8.7 (*)    GFR calc non Af Amer 39 (*)    GFR calc Af Amer 45 (*)    All other components within normal limits  CBC - Abnormal; Notable for the following components:   WBC 14.9 (*)    Platelets 604 (*)    All other components within normal limits  URINALYSIS, ROUTINE W REFLEX MICROSCOPIC - Abnormal; Notable for the following components:   Color, Urine ORANGE (*)    APPearance TURBID (*)    Hgb urine dipstick LARGE (*)    Bilirubin Urine MODERATE (*)    Protein, ur 100 (*)    Leukocytes, UA MODERATE (*)    All other components within normal limits   URINALYSIS, MICROSCOPIC (REFLEX) - Abnormal; Notable for the following components:   Bacteria, UA MANY (*)    Squamous Epithelial / LPF 6-30 (*)    Non Squamous Epithelial PRESENT (*)    All other components within normal limits  URINALYSIS, ROUTINE W REFLEX MICROSCOPIC - Abnormal; Notable for the following components:   APPearance TURBID (*)    Hgb urine dipstick LARGE (*)    Protein, ur >300 (*)    Leukocytes, UA LARGE (*)    All other components within normal limits  URINALYSIS, MICROSCOPIC (REFLEX) - Abnormal; Notable for  the following components:   Bacteria, UA MANY (*)    All other components within normal limits  CBG MONITORING, ED - Abnormal; Notable for the following components:   Glucose-Capillary 125 (*)    All other components within normal limits  URINE CULTURE    EKG EKG Interpretation  Date/Time:  Monday May 24 2017 21:01:18 EDT Ventricular Rate:  71 PR Interval:    QRS Duration: 95 QT Interval:  453 QTC Calculation: 493 R Axis:   -78 Text Interpretation:  Sinus rhythm Inferior infarct, old Consider anterior infarct QTc borderline, longer than prior No significant change since last tracing Confirmed by Gareth Morgan [phone]) on 05/24/2017 10:44:34 PM   Radiology No results found.  Procedures Procedures (including critical care time)  Medications Ordered in ED Medications  lactated ringers bolus 1,000 mL (1,000 mLs Intravenous New Bag/Given 05/25/17 0115)  cefTRIAXone (ROCEPHIN) 1 g in sodium chloride 0.9 % 100 mL IVPB (has no administration in time range)  potassium chloride SA (K-DUR,KLOR-CON) CR tablet 40 mEq (40 mEq Oral Given 05/25/17 0114)  potassium chloride 10 mEq in 100 mL IVPB (10 mEq Intravenous New Bag/Given 05/25/17 0114)     Initial Impression / Assessment and Plan / ED Course  I have reviewed the triage vital signs and the nursing notes.  Pertinent labs & imaging results that were available during my care of the patient were reviewed  by me and considered in my medical decision making (see chart for details).     DDx:  ICH Stroke CHF exacerbation COPD exacerbation Infection - pneumonia/UTI/Cellulitis PE Dehydration Electrolyte abnormality Tox syndrome  72 year old female brought into the ER with ataxic gait.  Patient over the last week or so has had steady decline in her ambulation.  Patient has no history of stroke, and clinically I do not have concerns for stroke.  I ambulated the patient and her gait according to family is not at her baseline normal.  Differential diagnosis includes normal pressure hydrocephalus and UTIs.  We will reassess and decide on the disposition.  Final Clinical Impressions(s) / ED Diagnoses   Final diagnoses:  None    ED Discharge Orders    None       Varney Biles, MD 05/25/17 [phone]

## 2017-05-25 NOTE — Progress Notes (Signed)
[facility] PROGRESS NOTE    Progress Note  Alyssa Macdonald  TFT:[id number] DOB: [birth year] DOA: 05/24/2017 PCP: Bartholome Bill, MD     Brief Narrative:   Alyssa Macdonald is an 72 y.o. female past medical history of advanced dementia and hypertension essential tremors comes in with confusion that started the day prior to admission.  Assessment/Plan:   UTI (urinary tract infection): No events on telemetry discontinue telemetry admit to inpatient Agree with IV Rocephin, continue IV fluids and IV Rocephin. Patient can be transferred to MedSurg  Acute metabolic encephalopathy: Likely due to urinary tract infection, will continue to monitor. Use of alcohol and Haldol at bedtime as needed for agitation. Check a 12-lead EKG in the morning to monitor QTC. PT OT consult in the morning.  Acute kidney injury: Urinary sodium less than 10 continue IV fluids check a basic metabolic panel in the morning. Spastic dysphonia  Advanced dementia without behavioral disturbances: Continue Namenda in the hospital.  Anxiety: We will continue to hold amlodipine will use Haldol as needed for agitation.  Spastic dysphonia:  Dizziness: Likely due to UTI use meclizine IV as needed I am guessing this is orthostatics, patient is not cooperating to check orthostatic vitals and she cannot give a good history.  Essential hypertension: Continue propranolol IV as needed hydralazine.  Hypokalemia: We will continue to replete we will try to give it orally.   DVT prophylaxis: lovenox Family Communication:daughter in [place] Disposition Plan/Barrier to D/C: SNF in 2-3days Code Status:     Code Status Orders  (From admission, onward)        Start     Ordered   05/25/17 0409  Do not attempt resuscitation (DNR)  Continuous    Question Answer Comment  In the event of cardiac or respiratory ARREST Do not call a "code blue"   In the event of cardiac or respiratory ARREST Do not perform  Intubation, CPR, defibrillation or ACLS   In the event of cardiac or respiratory ARREST Use medication by any route, position, wound care, and other measures to relive pain and suffering. May use oxygen, suction and manual treatment of airway obstruction as needed for comfort.      05/25/17 0409    Code Status History    This patient has a current code status but no historical code status.    Advance Directive Documentation     Most Recent Value  Type of Advance Directive  Living will  Pre-existing out of facility DNR order (yellow form or pink MOST form)  -  "MOST" Form in Place?  -        IV Access:    Peripheral IV   Procedures and diagnostic studies:   Ct Head Wo Contrast  Result Date: 05/25/2017 CLINICAL DATA:  Acute onset of dizziness and nausea. EXAM: CT HEAD WITHOUT CONTRAST TECHNIQUE: Contiguous axial images were obtained from the base of the skull through the vertex without intravenous contrast. COMPARISON:  MRI of the brain performed 03/27/2016 FINDINGS: Brain: No evidence of acute infarction, hemorrhage, hydrocephalus, extra-axial collection or mass lesion / mass effect. Prominence of the ventricles and sulci reflects mild cortical volume loss. Mild cerebellar atrophy is noted. Scattered periventricular and subcortical matter change likely reflects small vessel ischemic microangiopathy. The brainstem and fourth ventricle are within normal limits. The basal ganglia are unremarkable in appearance. The cerebral hemispheres demonstrate grossly normal gray-white differentiation. No mass effect or midline shift is seen. Vascular: No hyperdense vessel or unexpected calcification.  Skull: There is no evidence of fracture; visualized osseous structures are unremarkable in appearance. Sinuses/Orbits: The orbits are within normal limits. The paranasal sinuses and mastoid air cells are well-aerated. Other: No significant soft tissue abnormalities are seen. IMPRESSION: 1. No acute  intracranial pathology seen on CT. 2. Mild cortical volume loss and scattered small vessel ischemic microangiopathy. Electronically Signed   By: Garald Balding M.D.   On: 05/25/2017 04:44     Medical Consultants:    None.  Anti-Infectives:   IV Rocephin  Subjective:    Alyssa Macdonald patient is somnolent.  Objective:    Vitals:   05/25/17 0830 05/25/17 0900 05/25/17 0930 05/25/17 1104  BP: (!) 95/50 (!) 102/42 104/67 (!) 108/92  Pulse: 73 77  (!) 105  Resp: (!) 21 (!) 22 (!) 26 (!) 25  Temp:    98 F (36.7 C)  TempSrc:    Oral  SpO2: 99% 95%  96%  Weight:      Height:        Intake/Output Summary (Last 24 hours) at 05/25/2017 1125 Last data filed at 05/25/2017 0700 Gross per 24 hour  Intake 2300 ml  Output 200 ml  Net 2100 ml   Filed Weights   05/24/17 2101  Weight: 78.5 kg (173 lb)    Exam: General exam: In no acute distress. Respiratory system: Good air movement and clear to auscultation. Cardiovascular system: S1 & S2 heard, RRR. Gastrointestinal system: Abdomen is nondistended, soft and nontender.  Central nervous system: Alert and oriented. No focal neurological deficits. Extremities: No pedal edema. Skin: No rashes, lesions or ulcers    Data Reviewed:    Labs: Basic Metabolic Panel: Recent Labs  Lab 05/24/17 2117 05/25/17 0613  NA 132* 135  K 2.5* 2.8*  CL 89* 97*  CO2 29 26  GLUCOSE 144* 99  BUN 20 17  CREATININE 1.33* 1.10*  CALCIUM 8.7* 7.9*  MG  --  1.8   GFR Estimated Creatinine Clearance: 49.2 mL/min (A) (by C-G formula based on SCr of 1.1 mg/dL (H)). Liver Function Tests: No results for input(s): AST, ALT, ALKPHOS, BILITOT, PROT, ALBUMIN in the last 168 hours. No results for input(s): LIPASE, AMYLASE in the last 168 hours. No results for input(s): AMMONIA in the last 168 hours. Coagulation profile No results for input(s): INR, PROTIME in the last 168 hours.  CBC: Recent Labs  Lab 05/24/17 2117 05/25/17 0613  WBC  14.9* 12.2*  HGB 13.9 12.3  HCT 41.1 36.7  MCV 89.0 88.0  PLT 604* 435*   Cardiac Enzymes: No results for input(s): CKTOTAL, CKMB, CKMBINDEX, TROPONINI in the last 168 hours. BNP (last 3 results) No results for input(s): PROBNP in the last 8760 hours. CBG: Recent Labs  Lab 05/24/17 2331 05/25/17 1101  GLUCAP 125* 72   D-Dimer: No results for input(s): DDIMER in the last 72 hours. Hgb A1c: No results for input(s): HGBA1C in the last 72 hours. Lipid Profile: No results for input(s): CHOL, HDL, LDLCALC, TRIG, CHOLHDL, LDLDIRECT in the last 72 hours. Thyroid function studies: No results for input(s): TSH, T4TOTAL, T3FREE, THYROIDAB in the last 72 hours.  Invalid input(s): FREET3 Anemia work up: No results for input(s): VITAMINB12, FOLATE, FERRITIN, TIBC, IRON, RETICCTPCT in the last 72 hours. Sepsis Labs: Recent Labs  Lab 05/24/17 2117 05/25/17 0613  PROCALCITON  --  0.26  WBC 14.9* 12.2*  LATICACIDVEN  --  1.1   Microbiology No results found for this or any previous visit (from  the past 240 hour(s)).   Medications:   . donepezil  10 mg Oral QHS  . enoxaparin (LOVENOX) injection  40 mg Subcutaneous Q24H  . multivitamin-iron-minerals-folic acid  1 tablet Oral Daily  . propranolol ER  60 mg Oral Daily  . traZODone  50 mg Oral QHS   Continuous Infusions: . sodium chloride 125 mL/hr at 05/25/17 0452  . [START ON 05/26/2017] cefTRIAXone (ROCEPHIN)  IV       LOS: 0 days   Charlynne Cousins  [facility] Pager [phone]  *Please refer to [email or web address], password [email or web address] to get updated schedule on who will round on this patient, as hospitalists switch teams weekly. If 7PM-7AM, please contact night-coverage at [email or web address], password [email or web address] for any overnight needs.  05/25/2017, 11:25 AM

## 2017-05-25 NOTE — ED Notes (Signed)
ED TO INPATIENT HANDOFF REPORT  Name/Age/Gender Alyssa Macdonald 72 y.o. female  Code Status    Code Status Orders  (From admission, onward)        Start     Ordered   05/25/17 0409  Do not attempt resuscitation (DNR)  Continuous    Question Answer Comment  In the event of cardiac or respiratory ARREST Do not call a "code blue"   In the event of cardiac or respiratory ARREST Do not perform Intubation, CPR, defibrillation or ACLS   In the event of cardiac or respiratory ARREST Use medication by any route, position, wound care, and other measures to relive pain and suffering. May use oxygen, suction and manual treatment of airway obstruction as needed for comfort.      05/25/17 0409    Code Status History    This patient has a current code status but no historical code status.    Advance Directive Documentation     Most Recent Value  Type of Advance Directive  Living will  Pre-existing out of facility DNR order (yellow form or pink MOST form)  -  "MOST" Form in Place?  -      Home/SNF/Other Home  Chief Complaint AMS; Dizziness; Weakness  Level of Care/Admitting Diagnosis ED Disposition    ED Disposition Condition [place]: [place] [100102]  Level of Care: Med-Surg [16]  Diagnosis: Encephalopathy [762263]  Admitting Physician: Charlynne Cousins [[id number]]  Attending Physician: Charlynne Cousins [[id number]]  Estimated length of stay: past midnight tomorrow  Certification:: I certify this patient will need inpatient services for at least 2 midnights  PT Class (Do Not Modify): Inpatient [101]  PT Acc Code (Do Not Modify): Private [1]       Medical History Past Medical History:  Diagnosis Date  . Cervical dystonia   . Complication of anesthesia    slow to awaken  . Endometriosis   . Esophageal reflux   . Essential and other specified forms of tremor   . GERD (gastroesophageal reflux disease)   . Hyperlipidemia   .  Hypertension   . Hypopotassemia   . Osteoarthrosis involving, or with mention of more than one site, but not specified as generalized, site unspecified(715.80)   . Osteoarthrosis involving, or with mention of more than one site, but not specified as generalized, site unspecified(715.80)   . 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 Trigger finger     Allergies Allergies  Allergen Reactions  . Zocor [Simvastatin]     Myalgia    IV Location/Drains/Wounds Patient Lines/Drains/Airways Status   Active Line/Drains/Airways    Name:   Placement date:   Placement time:   Site:   Days:   Peripheral IV 05/24/17 Right Antecubital   05/24/17    2334    Antecubital   1   Incision 02/01/12 Hand Right   02/01/12    0905     1940          Labs/Imaging Results for orders placed or performed during the hospital encounter of 05/24/17 (from the past 48 hour(s))  Basic metabolic panel     Status: Abnormal   Collection Time: 05/24/17  9:17 PM  Result Value Ref Range   Sodium 132 (L) 135 - 145 mmol/L   Potassium 2.5 (LL) 3.5 - 5.1 mmol/L    Comment: CRITICAL RESULT CALLED TO, READ BACK BY AND VERIFIED WITH: Jordan Hawks RN [id number] 05/24/17 A  NAVARRO    Chloride 89 (L) 101 - 111 mmol/L   CO2 29 22 - 32 mmol/L   Glucose, Bld 144 (H) 65 - 99 mg/dL   BUN 20 6 - 20 mg/dL   Creatinine, Ser 1.33 (H) 0.44 - 1.00 mg/dL   Calcium 8.7 (L) 8.9 - 10.3 mg/dL   GFR calc non Af Amer 39 (L) >60 mL/min   GFR calc Af Amer 45 (L) >60 mL/min    Comment: (NOTE) The eGFR has been calculated using the CKD EPI equation. This calculation has not been validated in all clinical situations. eGFR's persistently <60 mL/min signify possible Chronic Kidney Disease.    Anion gap 14 5 - 15    Comment: Performed at [facility], [place] [address], [place], [place] [address]  CBC     Status: Abnormal   Collection Time: 05/24/17  9:17 PM  Result Value Ref Range   WBC 14.9 (H) 4.0 - 10.5 K/uL   RBC 4.62  3.87 - 5.11 MIL/uL   Hemoglobin 13.9 12.0 - 15.0 g/dL   HCT 41.1 36.0 - 46.0 %   MCV 89.0 78.0 - 100.0 fL   MCH 30.1 26.0 - 34.0 pg   MCHC 33.8 30.0 - 36.0 g/dL   RDW 13.0 11.5 - 15.5 %   Platelets 604 (H) 150 - 400 K/uL    Comment: Performed at [facility], [place] [address] [place], [place] [address]  Urinalysis, Routine w reflex microscopic     Status: Abnormal   Collection Time: 05/24/17 10:31 PM  Result Value Ref Range   Color, Urine ORANGE (A) YELLOW    Comment: BIOCHEMICALS MAY BE AFFECTED BY COLOR   APPearance TURBID (A) CLEAR   Specific Gravity, Urine 1.015 1.005 - 1.030   pH 6.0 5.0 - 8.0   Glucose, UA NEGATIVE NEGATIVE mg/dL   Hgb urine dipstick LARGE (A) NEGATIVE   Bilirubin Urine MODERATE (A) NEGATIVE   Ketones, ur NEGATIVE NEGATIVE mg/dL   Protein, ur 100 (A) NEGATIVE mg/dL   Nitrite NEGATIVE NEGATIVE   Leukocytes, UA MODERATE (A) NEGATIVE    Comment: Performed at [facility], [place] [address], [place], [place] [address]  Urinalysis, Microscopic (reflex)     Status: Abnormal   Collection Time: 05/24/17 10:31 PM  Result Value Ref Range   RBC / HPF 0-5 0 - 5 RBC/hpf   WBC, UA TOO NUMEROUS TO COUNT 0 - 5 WBC/hpf   Bacteria, UA MANY (A) NONE SEEN   Squamous Epithelial / LPF 6-30 (A) NONE SEEN   Non Squamous Epithelial PRESENT (A) NONE SEEN    Comment: Performed at [facility], [place] [address] [place], [place] [address]  CBG monitoring, ED     Status: Abnormal   Collection Time: 05/24/17 11:31 PM  Result Value Ref Range   Glucose-Capillary 125 (H) 65 - 99 mg/dL  Urinalysis, Routine w reflex microscopic     Status: Abnormal   Collection Time: 05/25/17  1:24 AM  Result Value Ref Range   Color, Urine YELLOW YELLOW   APPearance TURBID (A) CLEAR   Specific Gravity, Urine 1.015 1.005 - 1.030   pH 6.0 5.0 - 8.0   Glucose, UA NEGATIVE NEGATIVE mg/dL   Hgb urine dipstick LARGE (A) NEGATIVE   Bilirubin Urine  NEGATIVE NEGATIVE   Ketones, ur NEGATIVE NEGATIVE mg/dL   Protein, ur >300 (A) NEGATIVE mg/dL   Nitrite NEGATIVE NEGATIVE    Comment: NEGATIVE  Leukocytes, UA LARGE (A) NEGATIVE    Comment: Performed at [facility], [place] [address] [place], [place] [address]  Urinalysis, Microscopic (reflex)     Status: Abnormal   Collection Time: 05/25/17  1:24 AM  Result Value Ref Range   RBC / HPF 0-5 0 - 5 RBC/hpf   WBC, UA TOO NUMEROUS TO COUNT 0 - 5 WBC/hpf   Bacteria, UA MANY (A) NONE SEEN   Squamous Epithelial / LPF NONE SEEN 0 - 5   Hyaline Casts, UA PRESENT     Comment: Performed at [facility], [place] [address] [place], [place] [address]  Creatinine, urine, random     Status: None   Collection Time: 05/25/17  1:24 AM  Result Value Ref Range   Creatinine, Urine 188.26 mg/dL    Comment: Performed at [facility], [place] [address], [place], [place] [address]  Sodium, urine, random     Status: None   Collection Time: 05/25/17  1:24 AM  Result Value Ref Range   Sodium, Ur <10 mmol/L    Comment: Performed at [facility], [place] [address] [place], [place] [address]  Magnesium     Status: None   Collection Time: 05/25/17  6:13 AM  Result Value Ref Range   Magnesium 1.8 1.7 - 2.4 mg/dL    Comment: Performed at [facility], [place] [address] [place], [place] [address]  Lactic acid, plasma     Status: None   Collection Time: 05/25/17  6:13 AM  Result Value Ref Range   Lactic Acid, Venous 1.1 0.5 - 1.9 mmol/L    Comment: Performed at [facility], [place] [address] [place], [place] [address]  Procalcitonin     Status: None   Collection Time: 05/25/17  6:13 AM  Result Value Ref Range   Procalcitonin 0.26 ng/mL    Comment:        Interpretation: PCT (Procalcitonin) <= 0.5 ng/mL: Systemic infection (sepsis) is not likely. Local bacterial infection is possible. (NOTE)       Sepsis  PCT Algorithm           Lower Respiratory Tract                                      Infection PCT Algorithm    ----------------------------     ----------------------------         PCT < 0.25 ng/mL                PCT < 0.10 ng/mL         Strongly encourage             Strongly discourage   discontinuation of antibiotics    initiation of antibiotics    ----------------------------     -----------------------------       PCT 0.25 - 0.50 ng/mL            PCT 0.10 - 0.25 ng/mL               OR       >80% decrease in PCT            Discourage initiation of  antibiotics      Encourage discontinuation           of antibiotics    ----------------------------     -----------------------------         PCT >= 0.50 ng/mL              PCT 0.26 - 0.50 ng/mL               AND        <80% decrease in PCT             Encourage initiation of                                             antibiotics       Encourage continuation           of antibiotics    ----------------------------     -----------------------------        PCT >= 0.50 ng/mL                  PCT > 0.50 ng/mL               AND         increase in PCT                  Strongly encourage                                      initiation of antibiotics    Strongly encourage escalation           of antibiotics                                     -----------------------------                                           PCT <= 0.25 ng/mL                                                 OR                                        > 80% decrease in PCT                                     Discontinue / Do not initiate                                             antibiotics Performed at [place] [address] [place], [place] [address]   Basic metabolic panel     Status: Abnormal   Collection Time: 05/25/17  6:13 AM  Result Value Ref Range   Sodium 135 135 - 145 mmol/L    Potassium 2.8 (L) 3.5 - 5.1 mmol/L   Chloride 97 (L) 101 - 111 mmol/L   CO2 26 22 - 32 mmol/L   Glucose, Bld 99 65 - 99 mg/dL   BUN 17 6 - 20 mg/dL   Creatinine, Ser 1.10 (H) 0.44 - 1.00 mg/dL   Calcium 7.9 (L) 8.9 - 10.3 mg/dL   GFR calc non Af Amer 49 (L) >60 mL/min   GFR calc Af Amer 56 (L) >60 mL/min    Comment: (NOTE) The eGFR has been calculated using the CKD EPI equation. This calculation has not been validated in all clinical situations. eGFR's persistently <60 mL/min signify possible Chronic Kidney Disease.    Anion gap 12 5 - 15    Comment: Performed at [facility], [place] [address], [place], [place] [address]  CBC     Status: Abnormal   Collection Time: 05/25/17  6:13 AM  Result Value Ref Range   WBC 12.2 (H) 4.0 - 10.5 K/uL   RBC 4.17 3.87 - 5.11 MIL/uL   Hemoglobin 12.3 12.0 - 15.0 g/dL   HCT 36.7 36.0 - 46.0 %   MCV 88.0 78.0 - 100.0 fL   MCH 29.5 26.0 - 34.0 pg   MCHC 33.5 30.0 - 36.0 g/dL   RDW 12.8 11.5 - 15.5 %   Platelets 435 (H) 150 - 400 K/uL    Comment: Performed at [facility], [place] [address] [place], [place] [address]  CBG monitoring, ED     Status: None   Collection Time: 05/25/17 11:01 AM  Result Value Ref Range   Glucose-Capillary 72 65 - 99 mg/dL   Ct Head Wo Contrast  Result Date: 05/25/2017 CLINICAL DATA:  Acute onset of dizziness and nausea. EXAM: CT HEAD WITHOUT CONTRAST TECHNIQUE: Contiguous axial images were obtained from the base of the skull through the vertex without intravenous contrast. COMPARISON:  MRI of the brain performed 03/27/2016 FINDINGS: Brain: No evidence of acute infarction, hemorrhage, hydrocephalus, extra-axial collection or mass lesion / mass effect. Prominence of the ventricles and sulci reflects mild cortical volume loss. Mild cerebellar atrophy is noted. Scattered periventricular and subcortical matter change likely reflects small vessel ischemic microangiopathy. The brainstem and  fourth ventricle are within normal limits. The basal ganglia are unremarkable in appearance. The cerebral hemispheres demonstrate grossly normal gray-white differentiation. No mass effect or midline shift is seen. Vascular: No hyperdense vessel or unexpected calcification. Skull: There is no evidence of fracture; visualized osseous structures are unremarkable in appearance. Sinuses/Orbits: The orbits are within normal limits. The paranasal sinuses and mastoid air cells are well-aerated. Other: No significant soft tissue abnormalities are seen. IMPRESSION: 1. No acute intracranial pathology seen on CT. 2. Mild cortical volume loss and scattered small vessel ischemic microangiopathy. Electronically Signed   By: Garald Balding M.D.   On: 05/25/2017 04:44    Pending Labs Unresulted Labs (From admission, onward)   Start     Ordered   05/26/17 5364  Basic metabolic panel  Daily,   R     05/25/17 1128   05/25/17 0405  Culture, blood (Routine X 2) w Reflex to ID Panel  BLOOD CULTURE X 2,   R    Comments:  Please obtain prior to antibiotic administration.    05/25/17 0405   05/25/17 0000  Urine culture  STAT,   STAT  05/24/17 2359      Vitals/Pain Today's Vitals   05/25/17 0830 05/25/17 0900 05/25/17 0930 05/25/17 1104  BP: (!) 95/50 (!) 102/42 104/67 (!) 108/92  Pulse: 73 77  (!) 105  Resp: (!) 21 (!) 22 (!) 26 (!) 25  Temp:    98 F (36.7 C)  TempSrc:    Oral  SpO2: 99% 95%  96%  Weight:      Height:        Isolation Precautions No active isolations  Medications Medications  donepezil (ARICEPT) tablet 10 mg (has no administration in time range)  multivitamin-iron-minerals-folic acid (CENTRUM) chewable tablet 1 tablet (has no administration in time range)  propranolol ER (INDERAL LA) 24 hr capsule 60 mg (has no administration in time range)  traZODone (DESYREL) tablet 50 mg (has no administration in time range)  ondansetron (ZOFRAN) injection 4 mg (has no administration in time  range)  hydrALAZINE (APRESOLINE) injection 5 mg (has no administration in time range)  acetaminophen (TYLENOL) tablet 650 mg (650 mg Oral Given 05/25/17 1102)  zolpidem (AMBIEN) tablet 5 mg (has no administration in time range)  enoxaparin (LOVENOX) injection 40 mg (has no administration in time range)  senna-docusate (Senokot-S) tablet 1 tablet (has no administration in time range)  cefTRIAXone (ROCEPHIN) 1 g in sodium chloride 0.9 % 100 mL IVPB (has no administration in time range)  meclizine (ANTIVERT) tablet 12.5 mg (has no administration in time range)  sodium chloride 0.9 % bolus 500 mL (has no administration in time range)  QUEtiapine (SEROQUEL) tablet 25 mg (has no administration in time range)  haloperidol lactate (HALDOL) injection 2 mg (has no administration in time range)  sodium chloride 0.9 % 1,000 mL with potassium chloride 40 mEq infusion (has no administration in time range)  potassium chloride SA (K-DUR,KLOR-CON) CR tablet 40 mEq (40 mEq Oral Given 05/25/17 0114)  potassium chloride 10 mEq in 100 mL IVPB (0 mEq Intravenous Stopped 05/25/17 0214)  lactated ringers bolus 1,000 mL (0 mLs Intravenous Stopped 05/25/17 0315)  cefTRIAXone (ROCEPHIN) 1 g in sodium chloride 0.9 % 100 mL IVPB (0 g Intravenous Stopped 05/25/17 0522)  potassium chloride SA (K-DUR,KLOR-CON) CR tablet 40 mEq (40 mEq Oral Given 05/25/17 0600)  magnesium sulfate IVPB 1 g 100 mL (0 g Intravenous Stopped 05/25/17 0700)  sodium chloride 0.9 % bolus 1,000 mL (0 mLs Intravenous Stopped 05/25/17 0551)    Mobility Unsure

## 2017-05-25 NOTE — Progress Notes (Signed)
T 101.2, HR 103, r 22 Bp82/45. Dr Venetia Constable was  notified and  Orders received.  1432 T 99.3, HR 93, bp 71/52 R 28 HR 92 o2 sat 94. Manula l BP WAS 60/40. Dr Venetia Constable was notified. IT TIME IS 1457. Md IS IN THE ROOM

## 2017-05-26 LAB — BASIC METABOLIC PANEL
ANION GAP: 10 (ref 5–15)
BUN: 13 mg/dL (ref 6–20)
CALCIUM: 7.5 mg/dL — AB (ref 8.9–10.3)
CO2: 24 mmol/L (ref 22–32)
CREATININE: 1.23 mg/dL — AB (ref 0.44–1.00)
Chloride: 106 mmol/L (ref 101–111)
GFR, EST AFRICAN AMERICAN: 49 mL/min — AB (ref >60)
GFR, EST NON AFRICAN AMERICAN: 42 mL/min — AB (ref >60)
GLUCOSE: 126 mg/dL — AB (ref 65–99)
Potassium: 3.2 mmol/L — ABNORMAL LOW (ref 3.5–5.1)
Sodium: 140 mmol/L (ref 135–145)

## 2017-05-26 LAB — GLUCOSE, CAPILLARY: Glucose-Capillary: 114 mg/dL — ABNORMAL HIGH (ref 65–99)

## 2017-05-26 MED ORDER — POTASSIUM CHLORIDE CRYS ER 20 MEQ PO TBCR
40.0000 meq | EXTENDED_RELEASE_TABLET | Freq: Two times a day (BID) | ORAL | Status: AC
Start: 2017-05-26 — End: 2017-05-26
  Administered 2017-05-26 (×2): 40 meq via ORAL
  Filled 2017-05-26 (×2): qty 2

## 2017-05-26 NOTE — Evaluation (Signed)
Physical Therapy Evaluation Patient Details Name: Alyssa Macdonald MRN: [id number] DOB: 12-31-1945 Today's Date: 05/26/2017   History of Present Illness  Pt admited with increased confusion, dizziness and balance for last 10 days was found to have UTI. Pt with dementia and essential tremors, however states she is indepent at home and does not use an assistive device to ambulate. Family lives nearby.   Clinical Impression   Pt moves fairly well, some fatigue, generalized weakness and decreased activity tolerance, however ambulation was limited today due to pt reporting dizziness EOB and also in standing that after time never subsided and BPS were low. Will continue to follow, and at this time unless things improve may need SNF, or if has assistance 24/7 to monitor pt with mobility due to weakness and dizziness.      Follow Up Recommendations SNF(possible HHPT if pt progresses and have 24/7 help inititally)    Equipment Recommendations  (may need RW , unsure at this time )    Recommendations for Other Services       Precautions / Restrictions Restrictions Weight Bearing Restrictions: No      Mobility  Bed Mobility Overal bed mobility: Modified Independent                Transfers Overall transfer level: Needs assistance Equipment used: Rolling walker (2 wheeled) Transfers: Sit to/from Stand Sit to Stand: Min guard         General transfer comment: guard due to reprots of dizziness sitting and dizziness when standing that did not get better after several stands , seted exercises, and marching in place. BPs low during session today limited walking however felt pt would have been able to walk if was not dizzy  Ambulation/Gait Ambulation/Gait assistance: (unable only due to report of dizziness and low BPs, feel she would have been able to if medical would not have prevented it. )              Stairs            Wheelchair Mobility    Modified Rankin (Stroke  Patients Only)       Balance                                             Pertinent Vitals/Pain Pain Assessment: No/denies pain    Home Living Family/patient expects to be discharged to:: Private residence Living Arrangements: Alone Available Help at Discharge: Other (Comment)(family lives nearby and could have ot [place] come to stay for a few days. ) Type of Home: House Home Access: Stairs to enter Entrance Stairs-Rails: Right Entrance Stairs-Number of Steps: 4 Home Layout: One level [place]: [place] - single point      Prior Function Level of Independence: Independent         Comments: pt reports she did not need assistance with anything prior to about a week ago. Since then has not been doing well and requiring assistance.      Hand Dominance        Extremity/Trunk Assessment        Lower Extremity Assessment Lower Extremity Assessment: Overall WFL for tasks assessed(however fatigued very easily)       Communication   Communication: No difficulties  Cognition Arousal/Alertness: Awake/alert Behavior During Therapy: WFL for tasks assessed/performed Overall Cognitive Status: Within Functional Limits for tasks assessed(some minor memory deficits,  however provided correct info according to son while in room )                                        General Comments      Exercises Other Exercises Other Exercises: at EOB and performed seated knee extensions, ankle pumps , and then mnarhcing in place standing at RW 10x3    Assessment/Plan    PT Assessment Patient needs continued PT services  PT Problem List Decreased strength;Decreased activity tolerance;Decreased mobility       PT Treatment Interventions Gait training;Functional mobility training;Therapeutic activities;Therapeutic exercise;Patient/family education    PT Goals (Current goals can be found in the Care Plan section)  Acute Rehab PT Goals Patient  Stated Goal: I want to go home  PT Goal Formulation: With patient Time For Goal Achievement: 06/09/17    Frequency Min 3X/week   Barriers to discharge        Co-evaluation               AM-PAC PT "6 Clicks" Daily Activity  Outcome Measure Difficulty turning over in bed (including adjusting bedclothes, sheets and blankets)?: None Difficulty moving from lying on back to sitting on the side of the bed? : Unable Difficulty sitting down on and standing up from a chair with arms (e.g., wheelchair, bedside commode, etc,.)?: Unable Help needed moving to and from a bed to chair (including a wheelchair)?: A Lot Help needed walking in hospital room?: A Lot Help needed climbing 3-5 steps with a railing? : A Lot 6 Click Score: 12    End of Session Equipment Utilized During Treatment: Gait belt Activity Tolerance: Other (comment);Treatment limited secondary to medical complications (Comment)(limited by low BPs ) Patient left: in bed;with call bell/phone within reach;with bed alarm set;with family/visitor present Nurse Communication: Mobility status(encouraged continued sitting EOB with nursing staff and [facility] for toileting to help promotoe upright movment today.) PT Visit Diagnosis: Unsteadiness on feet (R26.81)    Time: 1120-1200 PT Time Calculation (min) (ACUTE ONLY): 40 min   Charges:   PT Evaluation $PT Eval Low Complexity: 1 Low PT Treatments $Therapeutic Activity: 8-22 mins   PT G Codes:        [id number]  Brendan Gruwell 05/26/2017, 2:21 PM

## 2017-05-26 NOTE — Evaluation (Signed)
Occupational Therapy Evaluation Patient Details Name: Alyssa Macdonald MRN: [id number] DOB: Mar 26, 1945 Today's Date: 05/26/2017    History of Present Illness Pt admited with increased confusion, dizziness and balance for last 10 days was found to have UTI. Pt with dementia and essential tremors, however states she is indepent at home and does not use an assistive device to ambulate. Family lives nearby.    Clinical Impression   Eval limited due to BP of 77/60, RN present and requested OT not get pt OOB but may work with at bed level. Pt with decline in function and safety with ADLs and ADL mobility with decreased balance, strength ad endurnace. Pt with hx of cognitive impairments with dementia and lives at home alone. Pt's son present and states that pt's sister will be able to come to her home initially and stay 24/7 to assist prn. Pt has been with low BP this admission. Pt would benefit from acute OT services to address impairments to maximize level of function and safety    Follow Up Recommendations  Home health OT;Supervision/Assistance - 24 hour(may need SNF depending on progress and amount of assist at home)    Equipment Recommendations  Other (comment)(TBD)    Recommendations for Other Services       Precautions / Restrictions Precautions Precautions: Fall Restrictions Weight Bearing Restrictions: No      Mobility Bed Mobility Overal bed mobility: Needs Assistance Bed Mobility: Rolling Rolling: Modified independent (Device/Increase time)         General bed mobility comments: eval limited due to BP, per PT note, pt Mod I with bed mobility  Transfers Overall transfer level: Needs assistance Equipment used: Rolling walker (2 wheeled) Transfers: Sit to/from Stand Sit to Stand: Min guard         General transfer comment: NT, per PT note, pt min guard A with transfers    Balance                                           ADL either performed or  assessed with clinical judgement   ADL                                         General ADL Comments: Eval/ADL assessment limited due to BP of 77/60. RN present and administered IV to L hand. Pt participation at bed level     Vision Baseline Vision/History: Wears glasses Wears Glasses: Reading only Patient Visual Report: No change from baseline       Perception     Praxis      Pertinent Vitals/Pain Pain Assessment: No/denies pain     Hand Dominance Right   Extremity/Trunk Assessment Upper Extremity Assessment Upper Extremity Assessment: Generalized weakness   Lower Extremity Assessment Lower Extremity Assessment: Defer to PT evaluation       Communication Communication Communication: No difficulties   Cognition Arousal/Alertness: Awake/alert Behavior During Therapy: WFL for tasks assessed/performed Overall Cognitive Status: Within Functional Limits for tasks assessed                                     General Comments  NT due to low BP    Exercises Other Exercises Other Exercises:  at EOB and performed seated knee extensions, ankle pumps , and then mnarhcing in place standing at [facility] 10x3    Shoulder Instructions      Home Living Family/patient expects to be discharged to:: Private residence Living Arrangements: Alone Available Help at Discharge: Other (Comment);Family(family nearby and planning to have sister stay with her initially) Type of Home: House Home Access: Stairs to enter [employer] of Steps: 4 Entrance Stairs-Rails: Right Home Layout: One level         Biochemist, clinical: Standard     Home Equipment: Cane - single point          Prior Functioning/Environment Level of Independence: Independent    ADL's / Homemaking Assistance Needed: pt reports that she was independent with all ADLs. Pt's son present and did not intervene t make any corrections to pt report   Comments: pt reports she did not  need assistance with anything prior to about a week ago. Since then has not been doing well and requiring assistance.         OT Problem List: Decreased strength;Decreased activity tolerance;Decreased knowledge of use of DME or AE;Impaired balance (sitting and/or standing);Decreased knowledge of precautions;Decreased cognition      OT Treatment/Interventions: Self-care/ADL training;DME and/or AE instruction;Therapeutic activities;Balance training;Therapeutic exercise;Neuromuscular education;Patient/family education    OT Goals(Current goals can be found in the care plan section) Acute Rehab OT Goals Patient Stated Goal: I want to go home  OT Goal Formulation: With patient/family Time For Goal Achievement: 06/09/17 Potential to Achieve Goals: Good ADL Goals Pt Will Perform Grooming: with min assist;with min guard assist;standing Pt Will Perform Upper Body Bathing: with min guard assist;with supervision;with set-up;sitting Pt Will Perform Lower Body Bathing: with min assist;with min guard assist Pt Will Perform Upper Body Dressing: with min guard assist;with supervision;with set-up;sitting Pt Will Perform Lower Body Dressing: with min assist;with min guard assist Pt Will Transfer to Toilet: with min guard assist;with supervision;ambulating;regular height toilet;bedside commode;grab bars Pt Will Perform Toileting - Clothing Manipulation and hygiene: with min guard assist;with supervision;sit to/from stand Pt Will Perform Tub/Shower Transfer: with min guard assist;with supervision;ambulating;rolling walker;shower seat;3 in 1;grab bars  OT Frequency: Min 2X/week   Barriers to D/C:    pt lives at home alone, but will have family assist 24/7 from her sister per her son's report       Co-evaluation              AM-PAC PT "6 Clicks" Daily Activity     Outcome Measure Help from another person eating meals?: None Help from another person taking care of personal grooming?: A Little Help  from another person toileting, which includes using toliet, bedpan, or urinal?: A Little Help from another person bathing (including washing, rinsing, drying)?: A Little Help from another person to put on and taking off regular upper body clothing?: A Little Help from another person to put on and taking off regular lower body clothing?: A Little 6 Click Score: 19   End of Session    Activity Tolerance: Other (comment)(low BP, eval at bed level) Patient left: in bed;with call bell/phone within reach;with family/visitor present  OT Visit Diagnosis: Other abnormalities of gait and mobility (R26.89);Muscle weakness (generalized) (M62.81)                Time: 6834-1962 OT Time Calculation (min): 19 min Charges:  OT General Charges $OT Visit: 1 Visit OT Evaluation $OT Eval Moderate Complexity: 1 Mod G-Codes: OT G-codes **NOT FOR INPATIENT CLASS**  Functional Assessment Tool Used: AM-PAC 6 Clicks Daily Activity     Britt Bottom 05/26/2017, 3:09 PM

## 2017-05-26 NOTE — Progress Notes (Signed)
PT Note  Patient Details Name: Alyssa Macdonald MRN: [id number] DOB: 05/05/1945    Evaluated pt this morning. Pt did report getting dizzy sitting EOB . We worked sitting EOB for about 10 minutes and sit to stand 3 times with supervision only because reports of dizziness that got worse with standing. Sat and rested for periods of time and repeated standing and marching in place 3 times. Pt refused to walk at the moment due to her dizzy feeling. Son present.   BP sitting and after standing ,fluxuated between 84/52 to 93/54.   Son reports she does live alone, but will have some family staying with her initially. At this time feel her mobility will move along as long as her BP cooperates and her dizziness subsides. At this time recommend HHPT to follow and we will continue to follow at this time.   Recommend nursing continue to sit PT EOB and get to [facility] to increase mobility as well.   Full write up to follow.    Clide Dales 05/26/2017, 1:24 PM  [phone]

## 2017-05-26 NOTE — Progress Notes (Signed)
[facility] PROGRESS NOTE    Progress Note  Alyssa Macdonald  UJW:[id number] DOB: [birth year] DOA: 05/24/2017 PCP: Bartholome Bill, MD     Brief Narrative:   Alyssa Macdonald is an 72 y.o. female past medical history of advanced dementia and hypertension essential tremors comes in with confusion that started the day prior to admission.  Assessment/Plan:   UTI (urinary tract infection): No events on telemetry discontinue telemetry admit to inpatient Culture data showed more than 100,000 colonies of gram-negative rods, will continue IV Rocephin she has remained afebrile.  Acute metabolic encephalopathy: Likely due to urinary tract infection, now resolved Awaiting physical therapy evaluation. Use Seroquel and Haldol as needed for agitation.  Acute kidney injury: Likely prerenal in etiology resolved with IV fluid hydration.  Advanced dementia without behavioral disturbances: Continue Namenda in the hospital.  Anxiety: We will continue to hold amlodipine will use Haldol as needed for agitation.  Spastic dysphonia:  Dizziness: Likely orthostatic now resolved.  Essential hypertension: Continue propranolol IV as needed hydralazine.  Hypokalemia: Discontinue IV potassium supplementation, knows that she is mentating better, will give her oral potassium recheck a basic metabolic panel in the morning.   DVT prophylaxis: lovenox Family Communication:daughter in [place] Disposition Plan/Barrier to D/C: SNF in am Code Status:     Code Status Orders  (From admission, onward)        Start     Ordered   05/25/17 0409  Do not attempt resuscitation (DNR)  Continuous    Question Answer Comment  In the event of cardiac or respiratory ARREST Do not call a "code blue"   In the event of cardiac or respiratory ARREST Do not perform Intubation, CPR, defibrillation or ACLS   In the event of cardiac or respiratory ARREST Use medication by any route, position, wound care, and other  measures to relive pain and suffering. May use oxygen, suction and manual treatment of airway obstruction as needed for comfort.      05/25/17 0409    Code Status History    This patient has a current code status but no historical code status.    Advance Directive Documentation     Most Recent Value  Type of Advance Directive  Living will  Pre-existing out of facility DNR order (yellow form or pink MOST form)  -  "MOST" Form in Place?  -        IV Access:    Peripheral IV   Procedures and diagnostic studies:   Ct Head Wo Contrast  Result Date: 05/25/2017 CLINICAL DATA:  Acute onset of dizziness and nausea. EXAM: CT HEAD WITHOUT CONTRAST TECHNIQUE: Contiguous axial images were obtained from the base of the skull through the vertex without intravenous contrast. COMPARISON:  MRI of the brain performed 03/27/2016 FINDINGS: Brain: No evidence of acute infarction, hemorrhage, hydrocephalus, extra-axial collection or mass lesion / mass effect. Prominence of the ventricles and sulci reflects mild cortical volume loss. Mild cerebellar atrophy is noted. Scattered periventricular and subcortical matter change likely reflects small vessel ischemic microangiopathy. The brainstem and fourth ventricle are within normal limits. The basal ganglia are unremarkable in appearance. The cerebral hemispheres demonstrate grossly normal gray-white differentiation. No mass effect or midline shift is seen. Vascular: No hyperdense vessel or unexpected calcification. Skull: There is no evidence of fracture; visualized osseous structures are unremarkable in appearance. Sinuses/Orbits: The orbits are within normal limits. The paranasal sinuses and mastoid air cells are well-aerated. Other: No significant soft tissue abnormalities are seen. IMPRESSION:  1. No acute intracranial pathology seen on CT. 2. Mild cortical volume loss and scattered small vessel ischemic microangiopathy. Electronically Signed   By: Garald Balding  M.D.   On: 05/25/2017 04:44     Medical Consultants:    None.  Anti-Infectives:   IV Rocephin  Subjective:    Alyssa Macdonald no complaints able to carry on a conversation..  Objective:    Vitals:   05/25/17 1612 05/25/17 1801 05/25/17 2135 05/26/17 0623  BP: (!) 73/30  (!) 100/56 103/60  Pulse: 88  69 63  Resp:   18 15  Temp:  98.1 F (36.7 C) (!) 97.4 F (36.3 C) 97.7 F (36.5 C)  TempSrc:   Oral Oral  SpO2:   98% 96%  Weight:      Height:        Intake/Output Summary (Last 24 hours) at 05/26/2017 0940 Last data filed at 05/26/2017 0412 Gross per 24 hour  Intake 1205 ml  Output -  Net 1205 ml   Filed Weights   05/24/17 2101  Weight: 78.5 kg (173 lb)    Exam: General exam: In no acute distress. Respiratory system: Good air movement and clear to auscultation. Cardiovascular system: S1 & S2 heard, RRR. Gastrointestinal system: Abdomen is nondistended, soft and nontender.  Central nervous system: Alert and oriented. No focal neurological deficits. Extremities: No pedal edema. Skin: No rashes, lesions or ulcers    Data Reviewed:    Labs: Basic Metabolic Panel: Recent Labs  Lab 05/24/17 2117 05/25/17 0613 05/26/17 0338  NA 132* 135 140  K 2.5* 2.8* 3.2*  CL 89* 97* 106  CO2 29 26 24   GLUCOSE 144* 99 126*  BUN 20 17 13   CREATININE 1.33* 1.10* 1.23*  CALCIUM 8.7* 7.9* 7.5*  MG  --  1.8  --    GFR Estimated Creatinine Clearance: 44 mL/min (A) (by C-G formula based on SCr of 1.23 mg/dL (H)). Liver Function Tests: No results for input(s): AST, ALT, ALKPHOS, BILITOT, PROT, ALBUMIN in the last 168 hours. No results for input(s): LIPASE, AMYLASE in the last 168 hours. No results for input(s): AMMONIA in the last 168 hours. Coagulation profile No results for input(s): INR, PROTIME in the last 168 hours.  CBC: Recent Labs  Lab 05/24/17 2117 05/25/17 0613  WBC 14.9* 12.2*  HGB 13.9 12.3  HCT 41.1 36.7  MCV 89.0 88.0  PLT 604* 435*    Cardiac Enzymes: No results for input(s): CKTOTAL, CKMB, CKMBINDEX, TROPONINI in the last 168 hours. BNP (last 3 results) No results for input(s): PROBNP in the last 8760 hours. CBG: Recent Labs  Lab 05/24/17 2331 05/25/17 1101 05/26/17 0630  GLUCAP 125* 72 114*   D-Dimer: No results for input(s): DDIMER in the last 72 hours. Hgb A1c: No results for input(s): HGBA1C in the last 72 hours. Lipid Profile: No results for input(s): CHOL, HDL, LDLCALC, TRIG, CHOLHDL, LDLDIRECT in the last 72 hours. Thyroid function studies: No results for input(s): TSH, T4TOTAL, T3FREE, THYROIDAB in the last 72 hours.  Invalid input(s): FREET3 Anemia work up: No results for input(s): VITAMINB12, FOLATE, FERRITIN, TIBC, IRON, RETICCTPCT in the last 72 hours. Sepsis Labs: Recent Labs  Lab 05/24/17 2117 05/25/17 0613 05/25/17 1529  PROCALCITON  --  0.26  --   WBC 14.9* 12.2*  --   LATICACIDVEN  --  1.1 1.7   Microbiology Recent Results (from the past 240 hour(s))  Urine culture     Status: Abnormal (Preliminary result)  Collection Time: 05/25/17  1:24 AM  Result Value Ref Range Status   Specimen Description   Final    URINE, RANDOM Performed at [facility] [address], [place], [place] [address]    Special Requests   Final    NONE Performed at [facility], [place] [address], [place], [place] [address]    Culture >=100,000 COLONIES/mL GRAM NEGATIVE RODS (A)  Final   Report Status PENDING  Incomplete     Medications:   . donepezil  10 mg Oral QHS  . enoxaparin (LOVENOX) injection  40 mg Subcutaneous Daily  . multivitamins with iron  1 tablet Oral Daily  . propranolol ER  60 mg Oral Daily   Continuous Infusions: . 0.9 % NaCl with KCl 40 mEq / L 75 mL/hr (05/26/17 0505)  . cefTRIAXone (ROCEPHIN)  IV Stopped (05/26/17 0542)  . sodium chloride       LOS: 1 day   Charlynne Cousins  [facility] Pager [phone]  *Please refer  to [email or web address], password [email or web address] to get updated schedule on who will round on this patient, as hospitalists switch teams weekly. If 7PM-7AM, please contact night-coverage at [email or web address], password [email or web address] for any overnight needs.  05/26/2017, 9:40 AM

## 2017-05-26 NOTE — Progress Notes (Signed)
MD notified of most recent BP. Per MD, pt's baseline is low. No orders given. Will continue to monitor.

## 2017-05-27 LAB — BASIC METABOLIC PANEL
Anion gap: 7 (ref 5–15)
BUN: 12 mg/dL (ref 6–20)
CO2: 24 mmol/L (ref 22–32)
CREATININE: 1.02 mg/dL — AB (ref 0.44–1.00)
Calcium: 7.8 mg/dL — ABNORMAL LOW (ref 8.9–10.3)
Chloride: 104 mmol/L (ref 101–111)
GFR calc Af Amer: >60 mL/min (ref >60)
GFR, EST NON AFRICAN AMERICAN: 53 mL/min — AB (ref >60)
GLUCOSE: 83 mg/dL (ref 65–99)
POTASSIUM: 3.6 mmol/L (ref 3.5–5.1)
Sodium: 135 mmol/L (ref 135–145)

## 2017-05-27 LAB — URINE CULTURE

## 2017-05-27 LAB — GLUCOSE, CAPILLARY: Glucose-Capillary: 86 mg/dL (ref 65–99)

## 2017-05-27 MED ORDER — CEPHALEXIN 500 MG PO CAPS
500.0000 mg | ORAL_CAPSULE | Freq: Two times a day (BID) | ORAL | Status: DC
  Administered 2017-05-27 – 2017-05-28 (×2): 500 mg via ORAL
  Filled 2017-05-27 (×2): qty 1

## 2017-05-27 MED ORDER — KCL IN DEXTROSE-NACL 20-5-0.45 MEQ/L-%-% IV SOLN
INTRAVENOUS | Status: DC
  Administered 2017-05-27 – 2017-05-28 (×2): via INTRAVENOUS
  Filled 2017-05-27 (×3): qty 1000

## 2017-05-27 MED ORDER — SODIUM CHLORIDE 0.9 % IV BOLUS
3000.0000 mL | Freq: Once | INTRAVENOUS | Status: AC
  Administered 2017-05-27: 3000 mL via INTRAVENOUS

## 2017-05-27 NOTE — NC FL2 (Signed)
[place] LEVEL OF CARE SCREENING TOOL     IDENTIFICATION  Patient Name: Alyssa Macdonald Birthdate: 1945/10/24 Sex: female Admission Date (Current Location): 05/24/2017  [facility] and [place] Number:  Herbalist and Address:  [facility],  [place] [address] [place]      Provider Number: [id number]  Attending Physician Name and Address:  Roney Jaffe, MD  Relative Name and Phone Number:       Current Level of Care: Hospital Recommended Level of Care: [place] Prior Approval Number:    Date Approved/Denied:   PASRR Number:    Discharge Plan: SNF    Current Diagnoses: Patient Active Problem List   Diagnosis Date Noted  . UTI (urinary tract infection) 05/25/2017  . Acute metabolic encephalopathy 20/25/4270  . Essential hypertension 05/25/2017  . Anxiety 05/25/2017  . Dementia 05/25/2017  . Hypokalemia 05/25/2017  . Dizziness 05/25/2017  . Encephalopathy 05/25/2017  . 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 Abnormal urine odor 06/18/2013  . Foul smelling urine 06/18/2013  . Vaginitis and vulvovaginitis, unspecified 06/18/2013  . Need for prophylactic vaccination and inoculation against influenza 12/11/2012  . Knee pain, chronic 12/11/2012  . GERD (gastroesophageal reflux disease) 12/11/2012  . Other and unspecified hyperlipidemia 12/11/2012  . Stress due to illness of family member 11/05/2012  . 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 Obesity (BMI 30-39.9) 10/11/2012  . Tremors of nervous system 09/10/2012  . Other malaise and fatigue 09/10/2012  . Leg cramps 09/10/2012  . Osteopenia 09/10/2012    Orientation RESPIRATION BLADDER Height & Weight     Self, Situation, Place  Normal Continent Weight: 173 lb (78.5 kg) Height:  5\' 7"  (170.2 cm)  BEHAVIORAL SYMPTOMS/MOOD NEUROLOGICAL BOWEL NUTRITION STATUS      Continent Diet(full liquid diet- see DC summary for updated diet )   AMBULATORY STATUS COMMUNICATION OF NEEDS Skin   Extensive Assist Verbally Normal                       Personal Care Assistance Level of Assistance  Bathing, Feeding, Dressing Bathing Assistance: Limited assistance Feeding assistance: Independent Dressing Assistance: Independent     Functional Limitations Info  Sight, Hearing, Speech Sight Info: Adequate Hearing Info: Adequate Speech Info: Adequate    SPECIAL CARE FACTORS FREQUENCY  PT (By licensed PT), OT (By licensed OT)     PT Frequency: 5x OT Frequency: 5x            Contractures Contractures Info: Not present    Additional Factors Info  Code Status, Allergies Code Status Info: DNR Allergies Info: zocor           Current Medications (05/27/2017):  This is the current hospital active medication list Current Facility-Administered Medications  Medication Dose Route Frequency Provider Last Rate Last Dose  . acetaminophen (TYLENOL) tablet 650 mg  650 mg Oral Q6H PRN Ivor Costa, MD   650 mg at 05/25/17 1102  . cephALEXin (KEFLEX) capsule 500 mg  500 mg Oral Q12H Roney Jaffe, MD      . dextrose 5 % and 0.45 % NaCl with KCl 20 mEq/L infusion   Intravenous Continuous Roney Jaffe, MD      . donepezil (ARICEPT) tablet 10 mg  10 mg Oral QHS Ivor Costa, MD   10 mg at 05/26/17 2143  . enoxaparin (LOVENOX) injection 40 mg  40 mg Subcutaneous Daily Ivor Costa, MD   40 mg at  05/27/17 0953  . haloperidol lactate (HALDOL) injection 2 mg  2 mg Intravenous Q6H PRN Charlynne Cousins, MD      . meclizine (ANTIVERT) tablet 12.5 mg  12.5 mg Oral TID PRN Ivor Costa, MD      . multivitamins with iron tablet 1 tablet  1 tablet Oral Daily Charlynne Cousins, MD   1 tablet at 05/27/17 [phone]  . ondansetron (ZOFRAN) injection 4 mg  4 mg Intravenous Q8H PRN Ivor Costa, MD      . QUEtiapine (SEROQUEL) tablet 25 mg  25 mg Oral QHS PRN Charlynne Cousins, MD      . senna-docusate (Senokot-S) tablet 1 tablet  1 tablet Oral QHS  PRN Ivor Costa, MD   1 tablet at 05/26/17 1047  . sodium chloride 0.9 % bolus 3,000 mL  3,000 mL Intravenous Once Roney Jaffe, MD 500 mL/hr at 05/27/17 0855 3,000 mL at 05/27/17 0855  . sodium chloride 0.9 % bolus 500 mL  500 mL Intravenous Once Aileen Fass, Tammi Klippel, MD      . zolpidem [facility]) tablet 5 mg  5 mg Oral QHS PRN Ivor Costa, MD         Discharge Medications: Please see discharge summary for a list of discharge medications.  Relevant Imaging Results:  Relevant Lab Results:   Additional Information [phone]  Nila Nephew, LCSW

## 2017-05-27 NOTE — Progress Notes (Signed)
Occupational Therapy Treatment Patient Details Name: Alyssa Macdonald MRN: [id number] DOB: 1945-05-12 Today's Date: 05/27/2017    History of present illness Pt admited with increased confusion, dizziness and balance for last 10 days was found to have UTI. Pt with dementia and essential tremors, however states she is indepent at home and does not use an assistive device to ambulate. Family lives nearby.    OT comments  Pt sat EOB but declined further movement due to feeling poorly.  No dizziness today  Follow Up Recommendations  Supervision/Assistance - 24 hour;Home health OT(depending upon progress)    Equipment Recommendations  (tba)    Recommendations for Other Services      Precautions / Restrictions Precautions Precautions: Fall Restrictions Weight Bearing Restrictions: No       Mobility Bed Mobility Overal bed mobility: Modified Independent             General bed mobility comments: HOB  Transfers                 General transfer comment: pt declined    Balance                                           ADL either performed or assessed with clinical judgement   ADL Overall ADL's : Needs assistance/impaired     Grooming: Oral care;Set up;Sitting                                 General ADL Comments: pt states she generally feels bad but agreeable to working with OT.  BP 129 61 supine and no c/o dizziness     Vision       Perception     Praxis      Cognition Arousal/Alertness: Awake/alert Behavior During Therapy: WFL for tasks assessed/performed Overall Cognitive Status: Within Functional Limits for tasks assessed                                 General Comments: h/o dementia but lives alone; repeats self        Exercises     Shoulder Instructions       General Comments      Pertinent Vitals/ Pain       Pain Assessment: No/denies pain  Home Living                                          Prior Functioning/Environment              Frequency  Min 2X/week        Progress Toward Goals  OT Goals(current goals can now be found in the care plan section)  Progress towards OT goals: Not progressing toward goals - comment     Plan      Co-evaluation                 AM-PAC PT "6 Clicks" Daily Activity     Outcome Measure   Help from another person eating meals?: None Help from another person taking care of personal grooming?: A Little Help from another person toileting, which includes using toliet, bedpan, or urinal?: A  Little Help from another person bathing (including washing, rinsing, drying)?: A Little Help from another person to put on and taking off regular upper body clothing?: A Little Help from another person to put on and taking off regular lower body clothing?: A Little 6 Click Score: 19    End of Session    OT Visit Diagnosis: Other abnormalities of gait and mobility (R26.89);Muscle weakness (generalized) (M62.81)   Activity Tolerance Patient limited by fatigue   Patient Left in bed;with call bell/phone within reach;with family/visitor present   Nurse Communication          Time: 5465-6812 OT Time Calculation (min): 14 min  Charges: OT General Charges $OT Visit: 1 Visit OT Treatments $Self Care/Home Management : 8-22 mins  Alyssa Macdonald, Alyssa Macdonald [phone] 05/27/2017   Alyssa Macdonald 05/27/2017, 8:24 AM

## 2017-05-27 NOTE — Progress Notes (Signed)
[facility] PROGRESS NOTE    Progress Note  Alyssa Macdonald  QVZ:[id number] DOB: 1945/05/20 DOA: 05/24/2017 PCP: Bartholome Bill, MD     Brief Narrative:   Alyssa Macdonald is an 72 y.o. female past medical history of advanced dementia and hypertension essential tremors comes in with confusion that started the day prior to admission.  Assessment/Plan:   1) UTI (urinary tract infection): - urine cx + for EColi - cont IV rocephin awaiting sensitivities  2) Debility: patient remains very weak , 3rd day in hospital .  Was in the bed for 2- 3 wks at home per the daughter prior to admission.  Daughter was hoping pt could be dc'd to home, but I don't think she will do well.  Would recommend SNF placement for rehab stay.  Daughter is in agreement, have d/w SW.  Plan is for SNF placement.   3) Acute kidney injury: - improving, creat down 1.0  4) Advanced dementia without behavioral disturbances: Continue Namenda in the hospital.  5) Anxiety: - will use Haldol as needed for agitation  6) Dizziness: - now resolved.  7) Essential hypertension: - BP's soft, getting NS bolus - cont to hold norvasc  8) Hypokalemia: - improved, K 3.6  9) Acute metabolic encephalopathy: - Likely due to urinary tract infection w underlying dementia, now resolved - Use Seroquel and Haldol as needed for agitation. - K up 3.6 today   DVT prophylaxis: lovenox Family Communication: daughter in law Disposition Plan/Barrier to D/C: SNF in am Code Status: DNR  Kelly Splinter MD [facility] pgr [phone] 05/27/2017, 11:56 AM   IV Access:    Peripheral IV   Procedures and diagnostic studies:   No results found.   Medical Consultants:    None.  Anti-Infectives:   IV Rocephin  Subjective:    Alyssa Macdonald no complaints able to carry on a conversation..  Objective:    Vitals:   05/26/17 1300 05/26/17 2245 05/27/17 0433 05/27/17 0820  BP: (!) 76/49 (!)  90/52 (!) 107/55 129/61  Pulse: 67 67 74 70  Resp: 16 15 16    Temp: 98.3 F (36.8 C) 99.2 F (37.3 C) 98.9 F (37.2 C)   TempSrc: Oral Oral Oral   SpO2: 96% 98% 97%   Weight:      Height:        Intake/Output Summary (Last 24 hours) at 05/27/2017 1152 Last data filed at 05/27/2017 1055 Gross per 24 hour  Intake 660 ml  Output 1050 ml  Net -390 ml   Filed Weights   05/24/17 2101  Weight: 78.5 kg (173 lb)    Exam: General exam: In no acute distress. Respiratory system: Good air movement and clear to auscultation. Cardiovascular system: S1 & S2 heard, RRR. Gastrointestinal system: Abdomen is nondistended, soft and nontender.  Central nervous system: Alert and oriented. No focal neurological deficits. Extremities: No pedal edema. Skin: No rashes, lesions or ulcers    Data Reviewed:    Labs: Basic Metabolic Panel: Recent Labs  Lab 05/24/17 2117 05/25/17 0613 05/26/17 0338 05/27/17 0344  NA 132* 135 140 135  K 2.5* 2.8* 3.2* 3.6  CL 89* 97* 106 104  CO2 29 26 24 24   GLUCOSE 144* 99 126* 83  BUN 20 17 13 12   CREATININE 1.33* 1.10* 1.23* 1.02*  CALCIUM 8.7* 7.9* 7.5* 7.8*  MG  --  1.8  --   --    GFR Estimated Creatinine Clearance: 53 mL/min (A) (  by C-G formula based on SCr of 1.02 mg/dL (H)). Liver Function Tests: No results for input(s): AST, ALT, ALKPHOS, BILITOT, PROT, ALBUMIN in the last 168 hours. No results for input(s): LIPASE, AMYLASE in the last 168 hours. No results for input(s): AMMONIA in the last 168 hours. Coagulation profile No results for input(s): INR, PROTIME in the last 168 hours.  CBC: Recent Labs  Lab 05/24/17 2117 05/25/17 0613  WBC 14.9* 12.2*  HGB 13.9 12.3  HCT 41.1 36.7  MCV 89.0 88.0  PLT 604* 435*   Cardiac Enzymes: No results for input(s): CKTOTAL, CKMB, CKMBINDEX, TROPONINI in the last 168 hours. BNP (last 3 results) No results for input(s): PROBNP in the last 8760 hours. CBG: Recent Labs  Lab 05/24/17 2331  05/25/17 1101 05/26/17 0630 05/27/17 0734  GLUCAP 125* 72 114* 86   D-Dimer: No results for input(s): DDIMER in the last 72 hours. Hgb A1c: No results for input(s): HGBA1C in the last 72 hours. Lipid Profile: No results for input(s): CHOL, HDL, LDLCALC, TRIG, CHOLHDL, LDLDIRECT in the last 72 hours. Thyroid function studies: No results for input(s): TSH, T4TOTAL, T3FREE, THYROIDAB in the last 72 hours.  Invalid input(s): FREET3 Anemia work up: No results for input(s): VITAMINB12, FOLATE, FERRITIN, TIBC, IRON, RETICCTPCT in the last 72 hours. Sepsis Labs: Recent Labs  Lab 05/24/17 2117 05/25/17 0613 05/25/17 1529  PROCALCITON  --  0.26  --   WBC 14.9* 12.2*  --   LATICACIDVEN  --  1.1 1.7   Microbiology Recent Results (from the past 240 hour(s))  Urine culture     Status: Abnormal   Collection Time: 05/25/17  1:24 AM  Result Value Ref Range Status   Specimen Description   Final    URINE, RANDOM Performed at [facility], [place] [address] [place], [place] [address]    Special Requests   Final    NONE Performed at [facility], [place] [address] [place], [place] [address]    Culture >=100,000 COLONIES/mL ESCHERICHIA COLI (A)  Final   Report Status 05/27/2017 FINAL  Final   Organism ID, Bacteria ESCHERICHIA COLI (A)  Final      Susceptibility   Escherichia coli - MIC*    AMPICILLIN 4 SENSITIVE Sensitive     CEFAZOLIN <=4 SENSITIVE Sensitive     CEFTRIAXONE <=1 SENSITIVE Sensitive     CIPROFLOXACIN <=0.25 SENSITIVE Sensitive     GENTAMICIN <=1 SENSITIVE Sensitive     IMIPENEM <=0.25 SENSITIVE Sensitive     NITROFURANTOIN <=16 SENSITIVE Sensitive     TRIMETH/SULFA <=20 SENSITIVE Sensitive     AMPICILLIN/SULBACTAM <=2 SENSITIVE Sensitive     PIP/TAZO <=4 SENSITIVE Sensitive     Extended ESBL NEGATIVE Sensitive     * >=100,000 COLONIES/mL ESCHERICHIA COLI  Culture, blood (Routine X 2) w Reflex to ID Panel     Status: None  (Preliminary result)   Collection Time: 05/25/17  6:14 AM  Result Value Ref Range Status   Specimen Description   Final    BLOOD RIGHT ANTECUBITAL Performed at [facility] [address], [place], [place] [address]    Special Requests   Final    BOTTLES DRAWN AEROBIC AND ANAEROBIC Blood Culture adequate volume Performed at [facility] [address], [place], [place] [address]    Culture   Final    NO GROWTH 1 DAY Performed at [facility], [place] [address] [place], [place] [address]  Report Status PENDING  Incomplete  Culture, blood (Routine X 2) w Reflex to ID Panel     Status: None (Preliminary result)   Collection Time: 05/25/17  2:17 PM  Result Value Ref Range Status   Specimen Description   Final    BLOOD LEFT ARM Performed at [facility] [address], [place], [place] [address]    Special Requests   Final    BOTTLES DRAWN AEROBIC AND ANAEROBIC Blood Culture adequate volume Performed at [facility] [address] [place], [place] [address]    Culture   Final    NO GROWTH < 24 HOURS Performed at [facility] [address], [place], [place] [address]    Report Status PENDING  Incomplete     Medications:   . donepezil  10 mg Oral QHS  . enoxaparin (LOVENOX) injection  40 mg Subcutaneous Daily  . multivitamins with iron  1 tablet Oral Daily   Continuous Infusions: . cefTRIAXone (ROCEPHIN)  IV Stopped (05/27/17 1062)  . dextrose 5 % and 0.45 % NaCl with KCl 20 mEq/L    . sodium chloride 3,000 mL (05/27/17 0855)  . sodium chloride

## 2017-05-27 NOTE — Care Management Note (Signed)
Case Management Note  Patient Details  Name: Alyssa Macdonald MRN: [id number] Date of Birth: 1945-12-05  Discharge plan is for home with family. Choice offered for home health services and [facility] chosen. [facility] rep alerted of referral. Request made for RW and 3in1 and orders received. Will need orders for HHPT/OT at discharge.    Additional CommentsLynnell Catalan, RN 05/27/2017, 11:39 AM  [phone]

## 2017-05-27 NOTE — Clinical Social Work Note (Signed)
Clinical Social Work Assessment  Patient Details  Name: Alyssa Macdonald MRN: [id number] Date of Birth: 1945-12-28  Date of referral:  05/27/17               Reason for consult:  Facility Placement                Permission sought to share information with:  Family Supports Permission granted to share information::  Yes, Verbal Permission Granted  Name::     daughter Alyssa Macdonald::     Relationship::     Contact Information:     Housing/Transportation Living arrangements for the past 2 months:  Single Family Home Source of Information:  Patient, Adult Children Patient Interpreter Needed:  None Criminal Activity/Legal Involvement Pertinent to Current Situation/Hospitalization:  No - Comment as needed Significant Relationships:  Adult Children Lives with:  Self Do you feel safe going back to the place where you live?  Yes Need for family participation in patient care:  Yes (Comment)(daughter assists)  Care giving concerns:  Pt admitted from home where she resides alone. Family nearby assist with her supervision and care. PTA reports she was needing some assistance with ADls and ambulation but not as much as she is needing now.  Has dementia, is at baseline fairly oriented but forgets details and sometimes not oriented to time/situation per daughter. Admitted for UTI, encephalopathy.    Social Worker assessment / plan:  CSW consulted to assist with disposition. Met with pt and daughter at bedside. Explain they had planned for pt to return directly home, had been working to set up home health prior to admission, however now feel her best recuperation/rehab would be at SNF. CSW explained referral and insurance pre-authorization process. Daughter given SNF list to identify preferred facilities. CSW completed FL2 and made referrals, will follow up with bed offers and insurance auth can be initiated once facility selected.  Note- could not submit for PASRR as site is down. Will submit when  able. Pt will need approved PASRR prior to SNF admission.  Plan: SNF at DC for ST rehab Barriers: bed offers, PASRR, insurance auth  Employment status:  Retired Forensic scientist:  Arboriculturist, Music therapist) PT Recommendations:  [place], Home with [place] / Referral to community resources:  [place]  Patient/Family's Response to care:  grateful  Patient/Family's Understanding of and Emotional Response to Diagnosis, Current Treatment, and Prognosis:  Pt demonstrates some understanding but not thorough (able to describe being in hospital and plan but not able to describe treatment). Daughter however has good understanding of both. Both pt and daughter emotionally calm and reasonable about DC plan expectations - "We want her to be in the best place she can, want to have her home but if she'll get more care at rehab first, that is preferable"  Emotional Assessment Appearance:  Appears stated age Attitude/Demeanor/Rapport:  Engaged(sometimes distracted) Affect (typically observed):  Appropriate Orientation:  Oriented to Self, Oriented to Place, Oriented to Situation Alcohol / Substance use:  Not Applicable Psych involvement (Current and /or in the community):  No (Comment)  Discharge Needs  Concerns to be addressed:  Discharge Planning Concerns Readmission within the last 30 days:  No Current discharge risk:  Dependent with Mobility Barriers to Discharge:  Awaiting Regulatory affairs officer Tour manager), Insurance Authorization   Nila Nephew, [place] 05/27/2017, 2:46 PM  [phone]

## 2017-05-28 DIAGNOSIS — F0280 Dementia in other diseases classified elsewhere without behavioral disturbance: Secondary | ICD-10-CM

## 2017-05-28 DIAGNOSIS — G9341 Metabolic encephalopathy: Secondary | ICD-10-CM

## 2017-05-28 DIAGNOSIS — E876 Hypokalemia: Secondary | ICD-10-CM

## 2017-05-28 DIAGNOSIS — N179 Acute kidney failure, unspecified: Principal | ICD-10-CM

## 2017-05-28 DIAGNOSIS — I10 Essential (primary) hypertension: Secondary | ICD-10-CM

## 2017-05-28 DIAGNOSIS — N3000 Acute cystitis without hematuria: Secondary | ICD-10-CM

## 2017-05-28 LAB — BASIC METABOLIC PANEL
Anion gap: 9 (ref 5–15)
BUN: 6 mg/dL (ref 6–20)
CALCIUM: 8 mg/dL — AB (ref 8.9–10.3)
CO2: 22 mmol/L (ref 22–32)
CREATININE: 0.95 mg/dL (ref 0.44–1.00)
Chloride: 108 mmol/L (ref 101–111)
GFR calc non Af Amer: 58 mL/min — ABNORMAL LOW (ref >60)
Glucose, Bld: 112 mg/dL — ABNORMAL HIGH (ref 65–99)
Potassium: 3.7 mmol/L (ref 3.5–5.1)
SODIUM: 139 mmol/L (ref 135–145)

## 2017-05-28 LAB — GLUCOSE, CAPILLARY: GLUCOSE-CAPILLARY: 96 mg/dL (ref 65–99)

## 2017-05-28 MED ORDER — CEPHALEXIN 500 MG PO CAPS: 500.0000 mg | ORAL_CAPSULE | Freq: Two times a day (BID) | ORAL | 0 refills | Status: AC

## 2017-05-28 MED ORDER — CLONAZEPAM 0.5 MG PO TABS: 0.5000 mg | ORAL_TABLET | Freq: Every day | ORAL | 0 refills | Status: DC

## 2017-05-28 NOTE — Care Management Important Message (Signed)
Important Message  Patient Details  Name: Alyssa Macdonald MRN: [id number] Date of Birth: 15-May-1945   Medicare Important Message Given:  Yes    Kerin Salen 05/28/2017, 11:34 AMImportant Message  Patient Details  Name: Alyssa Macdonald MRN: [id number] Date of Birth: 02-19-1945   Medicare Important Message Given:  Yes    Kerin Salen 05/28/2017, 11:34 AM

## 2017-05-28 NOTE — Progress Notes (Signed)
PROGRESS NOTE    Alyssa Macdonald  WNI:[id number] DOB: [birth year] DOA: 05/24/2017 PCP: Bartholome Bill, MD    Brief Narrative:  [name] is a 72 year old female who presents with altered mental status and dizziness. Past medical history significant for hypertension, hyperlipidemia, GERD, anxiety, essential tremor, spastic dysphonia, dementia and obesity. As per son, she had become more confused than her baseline for the past 20 days with dizziness and poor balance. No nausea, vomiting, diarrhea or abdominal pain. Patient moves all four extremities, no unilateral weakness, numbness or tingling in extremities. No facial droop, slurred speech, vision change or hearing loss. No chest pain, shortness of breath, cough. No fever or chills. Not sure if patient has symptoms of UTI, no increased urinary frequency noted. On admission, temperature normal, no tachycardia, no tachypnea, oxygen saturation 96% on room air, positive urinalysis with large amount og leukocyte, WBC 14.9, lactic acid 1.1, potassium 2.5, acute renal injury with creatinine 1.33. CT head negative for acute intracranial abnormalities. 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  Patient was admitted with working diagnosis of UTI with acute metabolic encephalopathy.   Assessment & Plan:   Principal Problem:   UTI (urinary tract infection) Active Problems:   Spastic dysphonia   Acute metabolic encephalopathy   Essential hypertension   Anxiety   Dementia   Hypokalemia   Dizziness   Encephalopathy   UTI -Started on IV Rocephin, now transitioned to PO Cephalexin. -Urine culture positive for E.coli. -Blood culture no growth after 3 days.  Acute metabolic encephalopathy -Likely due to UTI, CT negative for acute intracranial abnormalities.  -No focal neurological findings on physical exam. -Continue frequent neuro checks.  Acute Kidney Injury  -Improving, creatinine down to 0.95.  Dementia -Continue Namenda and  Donepezil.  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  Hypertension -Continue home medications: propranolol. -IV hydralazine PRN.  Dizziness -Resolved. -Continue PRN meclizine  Hypokalemia -On admission 2.5, replenished, now 3.7. -Magnesium 1.8. -Continue to  Monitor.  DVT prophylaxis: Lovenox. Code Status: DNR. Family Communication: None at bedside. Disposition Plan: SNF.   Consultants:   None.  Procedures:   None.  Antimicrobials:   Cephalexin 4/25>>  Ceftriaxone 4/23-4/25   Subjective: Patient reports feeling much better since admission, reports feeling lightheaded and dizzy toay when getting out of bed to the chair. Denies headache, nausea, vomiting, diarrhea. Denies dysuria or urinary frequency. Denies abdominal pain, chest pain.   Objective: Vitals:   05/27/17 0820 05/27/17 1400 05/27/17 2128 05/28/17 0456  BP: 129/61 (!) 104/56 133/75 123/76  Pulse: 70 75 71 64  Resp:  15 14 14   Temp:  98.8 F (37.1 C) 98 F (36.7 C) 97.9 F (36.6 C)  TempSrc:  Oral Oral Oral  SpO2:  97% 94% 95%  Weight:      Height:        Intake/Output Summary (Last 24 hours) at 05/28/2017 1107 Last data filed at 05/28/2017 0951 Gross per 24 hour  Intake 1501.25 ml  Output 1500 ml  Net 1.25 ml   Filed Weights   05/24/17 2101  Weight: 78.5 kg (173 lb)    Examination:  General exam: Appears calm and comfortable, alert and oriented x 3. Respiratory system: Clear to auscultation. Respiratory effort normal. Cardiovascular system: S1 & S2 heard, RRR. No murmurs, rubs, gallops or clicks. No pedal edema. Gastrointestinal system: Abdomen is nondistended, soft and nontender. Normal bowel sounds heard. Central nervous system: Alert and oriented. No focal  neurological deficits. Extremities: Moves all 4 extremities. Skin: No rashes, lesions or ulcers. Psychiatry: Judgement and insight appear normal. Mood & affect appropriate.   Data Reviewed: I  have personally reviewed following labs and imaging studies  CBC: Recent Labs  Lab 05/24/17 2117 05/25/17 0613  WBC 14.9* 12.2*  HGB 13.9 12.3  HCT 41.1 36.7  MCV 89.0 88.0  PLT 604* 628*   Basic Metabolic Panel: Recent Labs  Lab 05/24/17 2117 05/25/17 0613 05/26/17 0338 05/27/17 0344 05/28/17 0432  NA 132* 135 140 135 139  K 2.5* 2.8* 3.2* 3.6 3.7  CL 89* 97* 106 104 108  CO2 29 26 24 24 22   GLUCOSE 144* 99 126* 83 112*  BUN 20 17 13 12 6   CREATININE 1.33* 1.10* 1.23* 1.02* 0.95  CALCIUM 8.7* 7.9* 7.5* 7.8* 8.0*  MG  --  1.8  --   --   --    GFR: Estimated Creatinine Clearance: 57 mL/min (by C-G formula based on SCr of 0.95 mg/dL). Liver Function Tests: No results for input(s): AST, ALT, ALKPHOS, BILITOT, PROT, ALBUMIN in the last 168 hours. No results for input(s): LIPASE, AMYLASE in the last 168 hours. No results for input(s): AMMONIA in the last 168 hours. Coagulation Profile: No results for input(s): INR, PROTIME in the last 168 hours. Cardiac Enzymes: No results for input(s): CKTOTAL, CKMB, CKMBINDEX, TROPONINI in the last 168 hours. BNP (last 3 results) No results for input(s): PROBNP in the last 8760 hours. HbA1C: No results for input(s): HGBA1C in the last 72 hours. CBG: Recent Labs  Lab 05/24/17 2331 05/25/17 1101 05/26/17 0630 05/27/17 0734 05/28/17 0743  GLUCAP 125* 72 114* 86 96   Lipid Profile: No results for input(s): CHOL, HDL, LDLCALC, TRIG, CHOLHDL, LDLDIRECT in the last 72 hours. Thyroid Function Tests: No results for input(s): TSH, T4TOTAL, FREET4, T3FREE, THYROIDAB in the last 72 hours. Anemia Panel: No results for input(s): VITAMINB12, FOLATE, FERRITIN, TIBC, IRON, RETICCTPCT in the last 72 hours. Sepsis Labs: Recent Labs  Lab 05/25/17 0613 05/25/17 1529  PROCALCITON 0.26  --   LATICACIDVEN 1.1 1.7    Recent Results (from the past 240 hour(s))  Urine culture     Status: Abnormal   Collection Time: 05/25/17  1:24 AM  Result  Value Ref Range Status   Specimen Description   Final    URINE, RANDOM Performed at [facility] [address], [place], [place] [address]    Special Requests   Final    NONE Performed at [facility], [place] [address], [place], [place] [address]    Culture >=100,000 COLONIES/mL ESCHERICHIA COLI (A)  Final   Report Status 05/27/2017 FINAL  Final   Organism ID, Bacteria ESCHERICHIA COLI (A)  Final      Susceptibility   Escherichia coli - MIC*    AMPICILLIN 4 SENSITIVE Sensitive     CEFAZOLIN <=4 SENSITIVE Sensitive     CEFTRIAXONE <=1 SENSITIVE Sensitive     CIPROFLOXACIN <=0.25 SENSITIVE Sensitive     GENTAMICIN <=1 SENSITIVE Sensitive     IMIPENEM <=0.25 SENSITIVE Sensitive     NITROFURANTOIN <=16 SENSITIVE Sensitive     TRIMETH/SULFA <=20 SENSITIVE Sensitive     AMPICILLIN/SULBACTAM <=2 SENSITIVE Sensitive     PIP/TAZO <=4 SENSITIVE Sensitive     Extended ESBL NEGATIVE Sensitive     * >=100,000 COLONIES/mL ESCHERICHIA COLI  Culture, blood (Routine X 2) w Reflex to ID Panel     Status: None (Preliminary  result)   Collection Time: 05/25/17  6:14 AM  Result Value Ref Range Status   Specimen Description   Final    BLOOD RIGHT ANTECUBITAL Performed at [facility] [address], [place], [place] [address]    Special Requests   Final    BOTTLES DRAWN AEROBIC AND ANAEROBIC Blood Culture adequate volume Performed at [facility] [address], [place], [place] [address]    Culture   Final    NO GROWTH 3 DAYS Performed at [facility], [place] [address] [place], [place] [address]    Report Status PENDING  Incomplete  Culture, blood (Routine X 2) w Reflex to ID Panel     Status: None (Preliminary result)   Collection Time: 05/25/17  2:17 PM  Result Value Ref Range Status   Specimen Description   Final    BLOOD LEFT ARM Performed at [facility] [address],  [place], [place] [address]    Special Requests   Final    BOTTLES DRAWN AEROBIC AND ANAEROBIC Blood Culture adequate volume Performed at [facility] [address] [place], [place] [address]    Culture   Final    NO GROWTH 3 DAYS Performed at [facility], [place] [address], [place], [place] [address]    Report Status PENDING  Incomplete         Radiology Studies: No results found.      Scheduled Meds: . cephALEXin  500 mg Oral Q12H  . donepezil  10 mg Oral QHS  . enoxaparin (LOVENOX) injection  40 mg Subcutaneous Daily  . multivitamins with iron  1 tablet Oral Daily   Continuous Infusions: . dextrose 5 % and 0.45 % NaCl with KCl 20 mEq/L 125 mL/hr at 05/28/17 0143  . sodium chloride       LOS: 3 days    Time spent: 25 minutes.   Eloy End, PA-S [facility] Pager [phone]  If 7PM-7AM, please contact night-coverage [email or web address] Password [facility] 05/28/2017, 11:07 AM

## 2017-05-28 NOTE — Clinical Social Work Placement (Signed)
   11:51 AM  Patient and family chose bed at [facility].  Patient will transport by [facility].  LCSW faxed dc docs to facility.   LCSW notified family of transfer.   RN report #: [id number]  BKJ  CLINICAL SOCIAL WORK PLACEMENT  NOTE  Date:  05/28/2017  Patient Details  Name: Alyssa Macdonald MRN: [id number] Date of Birth: 05-31-1945  Clinical Social Work is seeking post-discharge placement for this patient at the [place] level of care (*CSW will initial, date and re-position this form in  chart as items are completed):  Yes   Patient/family provided with [facility] Work Department's list of facilities offering this level of care within the geographic area requested by the patient (or if unable, by the patient's family).  Yes   Patient/family informed of their freedom to choose among providers that offer the needed level of care, that participate in Medicare, Medicaid or managed care program needed by the patient, have an available bed and are willing to accept the patient.  Yes   Patient/family informed of [facility]'s ownership interest in [facility] and [facility], as well as of the fact that they are under no obligation to receive care at these facilities.  PASRR submitted to EDS on       PASRR number received on 05/28/17     Existing PASRR number confirmed on       FL2 transmitted to all facilities in geographic area requested by pt/family on 05/27/17     FL2 transmitted to all facilities within larger geographic area on       Patient informed that his/her managed care company has contracts with or will negotiate with certain facilities, including the following:        Yes   Patient/family informed of bed offers received.  Patient chooses bed at [facility]     Physician recommends and patient chooses bed at      Patient to be transferred to [facility] on 05/28/17.  Patient to be  transferred to facility by       Patient family notified on 05/28/17 of transfer.  Name of family member notified:  [name] and Legrand Como     PHYSICIAN Please prepare priority discharge summary, including medications     Additional Comment:    _______________________________________________ Servando Snare, LCSW 05/28/2017, 11:50 AM

## 2017-05-28 NOTE — Discharge Summary (Signed)
Physician Discharge Summary  Alyssa Macdonald UUV:[id number] DOB: 1945-04-07 DOA: 05/24/2017  PCP: Bartholome Bill, MD  Admit date: 05/24/2017 Discharge date: 05/28/2017  Admitted From: Home  Disposition:  snf  Recommendations for Outpatient Follow-up and new medication changes:  1. Follow up with PCP in 1- week 2. Continue Cephalexin for 3 more days, last dose 05/31/2017   Home Health: no   Equipment/Devices: na   Discharge Condition: stable CODE STATUS: dnr  Diet recommendation:  Regular  Brief/Interim Summary: 72 year old female who presented with altered mental status and dizziness.  She does have the significant past medical history of hypertension, dyslipidemia, GERD, anxiety, and dementia.  Patient was noted to have worsening confusion for the last 10 days prior to hospitalization, associated with dizziness and poor balance.  On the initial physical examination blood pressure 110/68, heart rate 65, respirate 16, oxygen saturation 94%.  Lungs are clear to auscultation bilaterally heart S1-S2 present and rhythmic, the abdomen soft nontender, no lower extremity edema, patient was confused, disoriented but nonfocal.  Sodium 132, potassium 2.5, chloride 89, bicarb 29, glucose 144, BUN 20, creatinine 1.33, white count 4.9, hemoglobin 13.9, hematocrit 41.1, platelets 640.  Urinalysis with 100 protein, specific gravity 1.015, RBC 0-5, white cells too numerous to count.  Head CT with no acute changes, mild cortical volume loss and scattered small vessel ischemic microangiopathy.  EKG sinus rhythm, left axis deviation, poor R wave progression, normal intervals.  Patient was admitted to the hospital with the working diagnosis of acute metabolic encephalopathy due to urinary tract infection, in the setting of dementia, and complicated by acute kidney injury and hypokalemia.   1.  Acute metabolic encephalopathy.  Patient was admitted to the medical ward, she received supportive medical therapy  with IV fluids, electrolytes were corrected and she received IV antibiotic therapy with ceftriaxone.  Patient had episodes of agitation which improved with Haldol and Seroquel as needed.  By the time of discharge, she is close to her baseline, no current agitation.  She was found with significant debility, patient was seen by physical therapy with recommendations to be discharged to skilled nursing facility to continued physical therapy.   2.  Urinary tract infection due to E. coli, present on admission.  Patient was treated with IV ceftriaxone, white cell count improved down to 12.2 by April 23, patient has remained afebrile for the last 72 hours.  Blood cultures remain no growth, urine culture was positive E. coli which was poly-sensitive including cephalosporins.  Patient will be discharged on cephalexin for the next 3 days.   3.  Acute kidney injury with hypokalemia.  Patient received IV fluids with improvement of her kidney function, potassium was corrected with potassium chloride, discharge creatinine 0.95, potassium 3.7, serum bicarbonate 22.   4.  Advanced dementia.  Patient will continue donepizil, trazodone, and clonazepam..  5.  Hypertension.  Antihypertensive agents (propranolol) were held during her hospitalization, patient will resume amlodipine at discharge.  Systolic blood pressure discharge 123-133 mmHg.   6.  Essential tremors. Patient will resume propranolol at discharge.     Discharge Diagnoses:  Principal Problem:   UTI (urinary tract infection) Active Problems:   Spastic dysphonia   Acute metabolic encephalopathy   Essential hypertension   Anxiety   Dementia   Hypokalemia   Dizziness   Encephalopathy    Discharge Instructions   Allergies as of 05/28/2017      Reactions   Zocor [simvastatin]    Myalgia  Medication List    STOP taking these medications   incobotulinumtoxinA 100 units Solr injection Commonly known as:  XEOMIN   ondansetron 4 MG  tablet Commonly known as:  ZOFRAN     TAKE these medications   cephALEXin 500 MG capsule Commonly known as:  KEFLEX Take 1 capsule (500 mg total) by mouth every 12 (twelve) hours for 3 days. Last dose on 05/31/2017.   clonazePAM 0.5 MG tablet Commonly known as:  KLONOPIN Take 1 tablet (0.5 mg total) by mouth daily. What changed:  when to take this   donepezil 10 MG tablet Commonly known as:  ARICEPT Take 1 tablet (10 mg total) by mouth at bedtime.   multivitamin-iron-minerals-folic acid chewable tablet Chew 1 tablet by mouth daily.   propranolol ER 60 MG 24 hr capsule Commonly known as:  INDERAL LA TAKE ONE CAPSULE BY MOUTH ONE TIME DAILY   traZODone 50 MG tablet Commonly known as:  DESYREL Take 50 mg by mouth.            Durable Medical Equipment  (From admission, onward)        Start     Ordered   05/27/17 1139  For home use only DME 3 n 1  Once     05/27/17 1139   05/27/17 1138  For home use only DME Walker rolling  Once    Question:  Patient needs a walker to treat with the following condition  Answer:  Weakness   05/27/17 1139     Follow-up Information    [facility].   Specialty:  Home Health Services Why:  For home health PT/OT Contact information: Time [phone]          Allergies  Allergen Reactions  . Zocor [Simvastatin]     Myalgia    Consultations:     Procedures/Studies: Ct Head Wo Contrast  Result Date: 05/25/2017 CLINICAL DATA:  Acute onset of dizziness and nausea. EXAM: CT HEAD WITHOUT CONTRAST TECHNIQUE: Contiguous axial images were obtained from the base of the skull through the vertex without intravenous contrast. COMPARISON:  MRI of the brain performed 03/27/2016 FINDINGS: Brain: No evidence of acute infarction, hemorrhage, hydrocephalus, extra-axial collection or mass lesion / mass effect. Prominence of the ventricles and sulci reflects mild cortical  volume loss. Mild cerebellar atrophy is noted. Scattered periventricular and subcortical matter change likely reflects small vessel ischemic microangiopathy. The brainstem and fourth ventricle are within normal limits. The basal ganglia are unremarkable in appearance. The cerebral hemispheres demonstrate grossly normal gray-white differentiation. No mass effect or midline shift is seen. Vascular: No hyperdense vessel or unexpected calcification. Skull: There is no evidence of fracture; visualized osseous structures are unremarkable in appearance. Sinuses/Orbits: The orbits are within normal limits. The paranasal sinuses and mastoid air cells are well-aerated. Other: No significant soft tissue abnormalities are seen. IMPRESSION: 1. No acute intracranial pathology seen on CT. 2. Mild cortical volume loss and scattered small vessel ischemic microangiopathy. Electronically Signed   By: Garald Balding M.D.   On: 05/25/2017 04:44       Subjective: Patient feeling better, close to her baseline, continue to be very weak and deconditioned, poor appetite, no nausea or vomiting, no chest pain or dyspnea.   Discharge Exam: Vitals:   05/27/17 2128 05/28/17 0456  BP: 133/75 123/76  Pulse: 71 64  Resp: 14 14  Temp: 98 F (36.7 C) 97.9 F (36.6 C)  SpO2: 94%  95%   Vitals:   05/27/17 0820 05/27/17 1400 05/27/17 2128 05/28/17 0456  BP: 129/61 (!) 104/56 133/75 123/76  Pulse: 70 75 71 64  Resp:  15 14 14   Temp:  98.8 F (37.1 C) 98 F (36.7 C) 97.9 F (36.6 C)  TempSrc:  Oral Oral Oral  SpO2:  97% 94% 95%  Weight:      Height:        General: Not in pain or dyspnea.  Neurology: Awake and alert, non focal. Mild confusion but no agitation.   E ENT: mild pallor, no icterus, oral mucosa moist Cardiovascular: No JVD. S1-S2 present, rhythmic, no gallops, rubs, or murmurs. Non pitting lower extremity edema. Pulmonary: vesicular breath sounds bilaterally, adequate air movement, no wheezing, rhonchi or  rales. Gastrointestinal. Abdomen with no organomegaly, non tender, no rebound or guarding Skin. No rashes Musculoskeletal: no joint deformities   The results of significant diagnostics from this hospitalization (including imaging, microbiology, ancillary and laboratory) are listed below for reference.     Microbiology: Recent Results (from the past 240 hour(s))  Urine culture     Status: Abnormal   Collection Time: 05/25/17  1:24 AM  Result Value Ref Range Status   Specimen Description   Final    URINE, RANDOM Performed at [facility] [address], [place], [place] [address]    Special Requests   Final    NONE Performed at [facility], [place] [address], [place], [place] [address]    Culture >=100,000 COLONIES/mL ESCHERICHIA COLI (A)  Final   Report Status 05/27/2017 FINAL  Final   Organism ID, Bacteria ESCHERICHIA COLI (A)  Final      Susceptibility   Escherichia coli - MIC*    AMPICILLIN 4 SENSITIVE Sensitive     CEFAZOLIN <=4 SENSITIVE Sensitive     CEFTRIAXONE <=1 SENSITIVE Sensitive     CIPROFLOXACIN <=0.25 SENSITIVE Sensitive     GENTAMICIN <=1 SENSITIVE Sensitive     IMIPENEM <=0.25 SENSITIVE Sensitive     NITROFURANTOIN <=16 SENSITIVE Sensitive     TRIMETH/SULFA <=20 SENSITIVE Sensitive     AMPICILLIN/SULBACTAM <=2 SENSITIVE Sensitive     PIP/TAZO <=4 SENSITIVE Sensitive     Extended ESBL NEGATIVE Sensitive     * >=100,000 COLONIES/mL ESCHERICHIA COLI  Culture, blood (Routine X 2) w Reflex to ID Panel     Status: None (Preliminary result)   Collection Time: 05/25/17  6:14 AM  Result Value Ref Range Status   Specimen Description   Final    BLOOD RIGHT ANTECUBITAL Performed at [facility] [address] [place], [place] [address]    Special Requests   Final    BOTTLES DRAWN AEROBIC AND ANAEROBIC Blood Culture adequate volume Performed at [facility] [address]  [place], [place] [address]    Culture   Final    NO GROWTH 3 DAYS Performed at [facility], [place] [address], [place],  [address]    Report Status PENDING  Incomplete  Culture, blood (Routine X 2) w Reflex to ID Panel     Status: None (Preliminary result)   Collection Time: 05/25/17  2:17 PM  Result Value Ref Range Status   Specimen Description   Final    BLOOD LEFT ARM Performed at [facility] [address], [place],  [address]    Special Requests   Final    BOTTLES DRAWN AEROBIC AND ANAEROBIC Blood Culture  adequate volume Performed at [facility] [address], [place], [place] [address]    Culture   Final    NO GROWTH 3 DAYS Performed at [facility], [place] [address] [place], [place] [address]    Report Status PENDING  Incomplete     Labs: BNP (last 3 results) No results for input(s): BNP in the last 8760 hours. Basic Metabolic Panel: Recent Labs  Lab 05/24/17 2117 05/25/17 0613 05/26/17 0338 05/27/17 0344 05/28/17 0432  NA 132* 135 140 135 139  K 2.5* 2.8* 3.2* 3.6 3.7  CL 89* 97* 106 104 108  CO2 29 26 24 24 22   GLUCOSE 144* 99 126* 83 112*  BUN 20 17 13 12 6   CREATININE 1.33* 1.10* 1.23* 1.02* 0.95  CALCIUM 8.7* 7.9* 7.5* 7.8* 8.0*  MG  --  1.8  --   --   --    Liver Function Tests: No results for input(s): AST, ALT, ALKPHOS, BILITOT, PROT, ALBUMIN in the last 168 hours. No results for input(s): LIPASE, AMYLASE in the last 168 hours. No results for input(s): AMMONIA in the last 168 hours. CBC: Recent Labs  Lab 05/24/17 2117 05/25/17 0613  WBC 14.9* 12.2*  HGB 13.9 12.3  HCT 41.1 36.7  MCV 89.0 88.0  PLT 604* 435*   Cardiac Enzymes: No results for input(s): CKTOTAL, CKMB, CKMBINDEX, TROPONINI in the last 168 hours. BNP: Invalid input(s): POCBNP CBG: Recent Labs  Lab 05/24/17 2331 05/25/17 1101 05/26/17 0630 05/27/17 0734 05/28/17 0743  GLUCAP 125* 72 114* 86 96   D-Dimer No  results for input(s): DDIMER in the last 72 hours. Hgb A1c No results for input(s): HGBA1C in the last 72 hours. Lipid Profile No results for input(s): CHOL, HDL, LDLCALC, TRIG, CHOLHDL, LDLDIRECT in the last 72 hours. Thyroid function studies No results for input(s): TSH, T4TOTAL, T3FREE, THYROIDAB in the last 72 hours.  Invalid input(s): FREET3 Anemia work up No results for input(s): VITAMINB12, FOLATE, FERRITIN, TIBC, IRON, RETICCTPCT in the last 72 hours. Urinalysis    Component Value Date/Time   COLORURINE YELLOW 05/25/2017 0124   APPEARANCEUR TURBID (A) 05/25/2017 0124   LABSPEC 1.015 05/25/2017 0124   PHURINE 6.0 05/25/2017 0124   GLUCOSEU NEGATIVE 05/25/2017 0124   HGBUR LARGE (A) 05/25/2017 0124   BILIRUBINUR NEGATIVE 05/25/2017 0124   BILIRUBINUR negative 04/18/2013 1223   KETONESUR NEGATIVE 05/25/2017 0124   PROTEINUR >300 (A) 05/25/2017 0124   UROBILINOGEN negative 04/18/2013 1223   UROBILINOGEN 1.0 01/31/2008 0717   NITRITE NEGATIVE 05/25/2017 0124   LEUKOCYTESUR LARGE (A) 05/25/2017 0124   Sepsis Labs Invalid input(s): PROCALCITONIN,  WBC,  LACTICIDVEN Microbiology Recent Results (from the past 240 hour(s))  Urine culture     Status: Abnormal   Collection Time: 05/25/17  1:24 AM  Result Value Ref Range Status   Specimen Description   Final    URINE, RANDOM Performed at [facility], [place] [address] [place], [place] [address]    Special Requests   Final    NONE Performed at [facility], [place] [address], [place], [place] [address]    Culture >=100,000 COLONIES/mL ESCHERICHIA COLI (A)  Final   Report Status 05/27/2017 FINAL  Final   Organism ID, Bacteria ESCHERICHIA COLI (A)  Final      Susceptibility   Escherichia coli - MIC*    AMPICILLIN 4 SENSITIVE Sensitive     CEFAZOLIN <=4 SENSITIVE Sensitive     CEFTRIAXONE <=1  SENSITIVE Sensitive     CIPROFLOXACIN <=0.25 SENSITIVE Sensitive     GENTAMICIN <=1 SENSITIVE  Sensitive     IMIPENEM <=0.25 SENSITIVE Sensitive     NITROFURANTOIN <=16 SENSITIVE Sensitive     TRIMETH/SULFA <=20 SENSITIVE Sensitive     AMPICILLIN/SULBACTAM <=2 SENSITIVE Sensitive     PIP/TAZO <=4 SENSITIVE Sensitive     Extended ESBL NEGATIVE Sensitive     * >=100,000 COLONIES/mL ESCHERICHIA COLI  Culture, blood (Routine X 2) w Reflex to ID Panel     Status: None (Preliminary result)   Collection Time: 05/25/17  6:14 AM  Result Value Ref Range Status   Specimen Description   Final    BLOOD RIGHT ANTECUBITAL Performed at [facility] [address] [place], [place] [address]    Special Requests   Final    BOTTLES DRAWN AEROBIC AND ANAEROBIC Blood Culture adequate volume Performed at [facility] [address] [place], [place] [address]    Culture   Final    NO GROWTH 3 DAYS Performed at [facility], [place] [address], [place], [place] [address]    Report Status PENDING  Incomplete  Culture, blood (Routine X 2) w Reflex to ID Panel     Status: None (Preliminary result)   Collection Time: 05/25/17  2:17 PM  Result Value Ref Range Status   Specimen Description   Final    BLOOD LEFT ARM Performed at [facility] [address] [place], [place] [address]    Special Requests   Final    BOTTLES DRAWN AEROBIC AND ANAEROBIC Blood Culture adequate volume Performed at [facility] [address], [place], [place] [address]    Culture   Final    NO GROWTH 3 DAYS Performed at [facility], [place] [address] [place], [place] [address]    Report Status PENDING  Incomplete     Time coordinating discharge: 45 minutes  SIGNED:   Tawni Millers, MD  [facility] 05/28/2017, 12:06 PM Pager [phone]  If 7PM-7AM, please contact night-coverage [email or web address] Password [email or web address]

## 2017-05-28 NOTE — Progress Notes (Signed)
Physical Therapy Treatment Patient Details Name: Alyssa Macdonald MRN: [id number] DOB: [birth year] Today's Date: 05/28/2017    History of Present Illness   Patient was admitted to the hospital with the working diagnosis of acute metabolic encephalopathy due to urinary tract infection, in the setting of dementia, and complicated by acute kidney injury and hypokalemia.       PT Comments    Assisted OOB to amb to bathroom.  Assisted in bathroom then amb to recliner.  Pt required MinAssist and need for walker due to instability.  Prior lived home alone.  Pt will need ST Rehab at SNF.    Follow Up Recommendations  SNF     Equipment Recommendations       Recommendations for Other Services       Precautions / Restrictions Precautions Precautions: Fall Restrictions Weight Bearing Restrictions: No    Mobility  Bed Mobility Overal bed mobility: Needs Assistance Bed Mobility: Supine to Sit     Supine to sit: Supervision;Min guard     General bed mobility comments: increased time esp to complete scooting to EOB  Transfers Overall transfer level: Needs assistance Equipment used: None Transfers: Sit to/from [facility] Sit to Stand: Min assist Stand pivot transfers: Min assist       General transfer comment: 25% VC's on safety   Also assisted in bathroom   Ambulation/Gait Ambulation/Gait assistance: Min assist Ambulation Distance (Feet): 18 Feet(to and from abthroom ) Assistive device: Rolling walker (2 wheeled) Gait Pattern/deviations: Step-to pattern;Step-through pattern;Staggering left;Staggering right;Shuffle Gait velocity: decreased   General Gait Details: amb to and from bathroom with RW for safety.  Prior pt did NOT use any AD.  Mild unsteady shuffled gait.  Tolerated amn to and from bathroom only due to fatigue.   Stairs             Wheelchair Mobility    Modified Rankin (Stroke Patients Only)       Balance                                             Cognition Arousal/Alertness: Awake/alert Behavior During Therapy: WFL for tasks assessed/performed Overall Cognitive Status: Within Functional Limits for tasks assessed                                 General Comments: following multi commands    appears improved      Exercises      General Comments        Pertinent Vitals/Pain Pain Assessment: No/denies pain    Home Living                      Prior Function            PT Goals (current goals can now be found in the care plan section) Progress towards PT goals: Progressing toward goals    Frequency    Min 3X/week      PT Plan Current plan remains appropriate    Co-evaluation              AM-PAC PT "6 Clicks" Daily Activity  Outcome Measure  Difficulty turning over in bed (including adjusting bedclothes, sheets and blankets)?: A Lot Difficulty moving from lying on back to sitting on the side of the bed? :  A Lot Difficulty sitting down on and standing up from a chair with arms (e.g., wheelchair, bedside commode, etc,.)?: A Lot Help needed moving to and from a bed to chair (including a wheelchair)?: A Lot Help needed walking in hospital room?: A Lot Help needed climbing 3-5 steps with a railing? : Total 6 Click Score: 11    End of Session Equipment Utilized During Treatment: Gait belt Activity Tolerance: Patient limited by fatigue Patient left: in chair;with call bell/phone within reach;with family/visitor present Nurse Communication: Mobility status PT Visit Diagnosis: Unsteadiness on feet (R26.81)     Time: 7473-4037 PT Time Calculation (min) (ACUTE ONLY): 16 min  Charges:  $Gait Training: 8-22 mins                    G Codes:       Rica Koyanagi  PTA [facility]  Acute  Rehab Pager      [phone]

## 2017-05-30 LAB — CULTURE, BLOOD (ROUTINE X 2)
CULTURE: NO GROWTH
Culture: NO GROWTH
Special Requests: ADEQUATE
Special Requests: ADEQUATE

## 2017-05-31 ENCOUNTER — Non-Acute Institutional Stay (SKILLED_NURSING_FACILITY): Payer: Medicare Other | Admitting: Internal Medicine

## 2017-05-31 ENCOUNTER — Encounter: Payer: Self-pay | Admitting: Internal Medicine

## 2017-05-31 DIAGNOSIS — I10 Essential (primary) hypertension: Secondary | ICD-10-CM

## 2017-05-31 DIAGNOSIS — F0280 Dementia in other diseases classified elsewhere without behavioral disturbance: Secondary | ICD-10-CM

## 2017-05-31 DIAGNOSIS — F329 Major depressive disorder, single episode, unspecified: Secondary | ICD-10-CM

## 2017-05-31 DIAGNOSIS — R251 Tremor, unspecified: Secondary | ICD-10-CM | POA: Diagnosis not present

## 2017-05-31 DIAGNOSIS — G9341 Metabolic encephalopathy: Secondary | ICD-10-CM | POA: Diagnosis not present

## 2017-05-31 DIAGNOSIS — B9620 Unspecified Escherichia coli [E. coli] as the cause of diseases classified elsewhere: Secondary | ICD-10-CM

## 2017-05-31 DIAGNOSIS — F419 Anxiety disorder, unspecified: Secondary | ICD-10-CM

## 2017-05-31 DIAGNOSIS — N390 Urinary tract infection, site not specified: Secondary | ICD-10-CM | POA: Diagnosis not present

## 2017-05-31 DIAGNOSIS — F32A Depression, unspecified: Secondary | ICD-10-CM

## 2017-05-31 NOTE — Progress Notes (Signed)
: Provider:  Noah Delaine. Sheppard Coil, MD Location:  [place] Room Number: [id number] Place of Service:  SNF ([phone])  PCP: Bartholome Bill, MD Patient Care Team: Bartholome Bill, MD as PCP - General (Family Medicine)  Extended Emergency Contact Information Primary Emergency Contact: Sheran Lawless [place] of [place] Phone: [phone] Relation: Daughter Secondary Emergency Contact: Trula Ore States of [place] Phone: [phone] Relation: Son     Allergies: Zocor [simvastatin]  Chief Complaint  Patient presents with  . New Admit To SNF    Admit to Facility     HPI: Patient is 72 y.o. female with hypertension, hyperlipidemia, GERD, anxiety, dystonia, endometriosis, essential tremor, dementia, obesity, who presented to [facility] emergency department with altered mental status and dizziness.  Patient has had no nausea vomiting diarrhea or abdominal pain.  Patient moves all extremities no unilateral weakness, numbness or tingling.  No facial droop, slurred speech, vision change or hearing loss.  Patient denied having chest pain shortness of breath cough fever chills.  In ED patient had UA suggestive of UTI, WBC 14.9 potassium 2.5 normal temperature no tachycardia no tachypnea O2 saturation 90% on room air.  CT head negative for acute intracranial abnormalities.  Patient was admitted to [facility] from 4/20 2-26 for acute metabolic encephalopathy due to UTI in the setting of dementia and complicated by acute kidney injury and hypokalemia.  Past Medical History:  Diagnosis Date  . Cervical dystonia   . Complication of anesthesia    slow to awaken  . Depression   . Endometriosis    s/p total hysterectomy  . Esophageal reflux   . Essential and other specified forms of tremor   . GERD (gastroesophageal reflux disease)   . Hyperlipidemia   . Hypertension   . Hypopotassemia   . Osteoarthrosis involving, or with  mention of more than one site, but not specified as generalized, site unspecified(715.80)   . Osteoarthrosis involving, or with mention of more than one site, but not specified as generalized, site unspecified(715.80)   . 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 Tremors of nervous system   . Trigger finger     Past Surgical History:  Procedure Laterality Date  . APPENDECTOMY    . CATARACT EXTRACTION Bilateral    OD 07/03/2015, OS 05/29/2015  . CHOLECYSTECTOMY    . COLON SURGERY     bowel resection  . FOOT SURGERY    . JOINT REPLACEMENT     bilateral  . TONSILLECTOMY    . TOTAL ABDOMINAL HYSTERECTOMY    . TOTAL KNEE ARTHROPLASTY     Bilateral  . TRIGGER FINGER RELEASE  02/01/2012   Procedure: RELEASE TRIGGER FINGER/A-1 PULLEY;  Surgeon: Jolyn Nap, MD;  Location: [facility];  Service: Orthopedics;  Laterality: Right;  RIGHT LONG FINGER  TRIGGER FINGER RELEASE  . WISDOM TOOTH EXTRACTION      Allergies as of 05/31/2017      Reactions   Zocor [simvastatin]    Myalgia      Medication List        Accurate as of 05/31/17 10:33 AM. Always use your most recent med list.          cephALEXin 500 MG capsule Commonly known as:  KEFLEX Take 1 capsule (500 mg total) by mouth every 12 (twelve) hours for 3 days. Last dose on 05/31/2017.   clonazePAM 0.5 MG tablet Commonly known as:  KLONOPIN Take 1 tablet (0.5 mg total) by mouth daily.   donepezil 10 MG tablet Commonly known as:  ARICEPT Take 1 tablet (10 mg total) by mouth at bedtime.   multivitamin-iron-minerals-folic acid chewable tablet Chew 1 tablet by mouth daily.   propranolol ER 60 MG 24 hr capsule Commonly known as:  INDERAL LA TAKE ONE CAPSULE BY MOUTH ONE TIME DAILY   traZODone 50 MG tablet Commonly known as:  DESYREL Take 50 mg by mouth.       No orders of the defined types were placed in this encounter.   Immunization History  Administered Date(s) Administered  . Influenza  Split 12/03/2000, 02/06/2003  . Influenza, High Dose Seasonal PF 10/21/2015, 10/02/2016  . Influenza,inj,Quad PF,6+ Mos 10/11/2012, 03/21/2014, 11/03/2015  . Influenza-Unspecified 03/21/2014, 11/03/2015  . Pneumococcal Conjugate-13 07/10/2014, 07/10/2014  . Pneumococcal Polysaccharide-23 11/15/2015, 11/15/2015  . Pneumococcal-Unspecified 11/27/2003  . Td 08/29/2001, 05/16/2007  . Zoster 04/29/2006  . Zoster Recombinat (Shingrix) 10/02/2016    Social History   Tobacco Use  . Smoking status: Never Smoker  . Smokeless tobacco: Never Used  Substance Use Topics  . Alcohol use: Yes    Comment: Occasional,one glass of wine weekly    Family history is   Family History  Problem Relation Age of Onset  . CVA Mother   . Alzheimer's disease Mother   . Diabetes Father   . Heart disease Father   . Parkinson's disease Paternal Uncle   . Cataracts Sister   . Breast cancer Neg Hx       Review of Systems  DATA OBTAINED: from patient, nurse GENERAL:  no fevers, fatigue, appetite changes SKIN: No itching, or rash EYES: No eye pain, redness, discharge EARS: No earache, tinnitus, change in hearing NOSE: No congestion, drainage or bleeding  MOUTH/THROAT: No mouth or tooth pain, No sore throat RESPIRATORY: No cough, wheezing, SOB CARDIAC: No chest pain, palpitations, lower extremity edema  GI: No abdominal pain, No N/V/D or constipation, No heartburn or reflux  GU: No dysuria, frequency or urgency, or incontinence  MUSCULOSKELETAL: No unrelieved bone/joint pain NEUROLOGIC: No headache, dizziness or focal weakness PSYCHIATRIC: No c/o anxiety or sadness   Vitals:   05/31/17 1022  BP: 135/75  Pulse: 73  Resp: 20  Temp: 97.9 F (36.6 C)  SpO2: 98%    SpO2 Readings from Last 1 Encounters:  05/31/17 98%   Body mass index is 27.1 kg/m.     Physical Exam  GENERAL APPEARANCE: Alert, conversant,  No acute distress.  SKIN: No diaphoresis rash HEAD: Normocephalic, atraumatic    EYES: Conjunctiva/lids clear. Pupils round, reactive. EOMs intact.  EARS: External exam WNL, canals clear. Hearing grossly normal.  NOSE: No deformity or discharge.  MOUTH/THROAT: Lips w/o lesions  RESPIRATORY: Breathing is even, unlabored. Lung sounds are clear   CARDIOVASCULAR: Heart RRR no murmurs, rubs or gallops. No peripheral edema.   GASTROINTESTINAL: Abdomen is soft, non-tender, not distended w/ normal bowel sounds. GENITOURINARY: Bladder non tender, not distended  MUSCULOSKELETAL: No abnormal joints or musculature NEUROLOGIC:  Cranial nerves 2-12 grossly intact. Moves all extremities  PSYCHIATRIC: Mood and affect appropriate to situation, no behavioral issues  Patient Active Problem List   Diagnosis Date Noted  . UTI (urinary tract infection) 05/25/2017  . Acute metabolic encephalopathy 02/54/2706  . Essential hypertension 05/25/2017  . Anxiety 05/25/2017  . Dementia 05/25/2017  . Hypokalemia 05/25/2017  . Dizziness 05/25/2017  . Encephalopathy 05/25/2017  . 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 Abnormal urine odor  06/18/2013  . Foul smelling urine 06/18/2013  . Vaginitis and vulvovaginitis, unspecified 06/18/2013  . Need for prophylactic vaccination and inoculation against influenza 12/11/2012  . Knee pain, chronic 12/11/2012  . GERD (gastroesophageal reflux disease) 12/11/2012  . Other and unspecified hyperlipidemia 12/11/2012  . Stress due to illness of family member 11/05/2012  . 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 Obesity (BMI 30-39.9) 10/11/2012  . Tremors of nervous system 09/10/2012  . Other malaise and fatigue 09/10/2012  . Leg cramps 09/10/2012  . Osteopenia 09/10/2012      Labs reviewed: Basic Metabolic Panel:    Component Value Date/Time   NA 139 05/28/2017 0432   K 3.7 05/28/2017 0432   CL 108 05/28/2017 0432   CO2 22 05/28/2017 0432   GLUCOSE 112 (H) 05/28/2017 0432   BUN 6 05/28/2017 0432   CREATININE 0.95 05/28/2017  0432   CREATININE 0.98 10/04/2012 0811   CALCIUM 8.0 (L) 05/28/2017 0432   PROT 6.1 10/04/2012 0811   ALBUMIN 4.6 10/04/2012 0811   AST 14 10/04/2012 0811   ALT 12 10/04/2012 0811   ALKPHOS 62 10/04/2012 0811   BILITOT 0.5 10/04/2012 0811   GFRNONAA 58 (L) 05/28/2017 0432   GFRNONAA 59 (L) 10/04/2012 0811   GFRAA >60 05/28/2017 0432   GFRAA 68 10/04/2012 0811    Recent Labs    05/25/17 0613 05/26/17 0338 05/27/17 0344 05/28/17 0432  NA 135 140 135 139  K 2.8* 3.2* 3.6 3.7  CL 97* 106 104 108  CO2 26 24 24 22   GLUCOSE 99 126* 83 112*  BUN 17 13 12 6   CREATININE 1.10* 1.23* 1.02* 0.95  CALCIUM 7.9* 7.5* 7.8* 8.0*  MG 1.8  --   --   --    Liver Function Tests: No results for input(s): AST, ALT, ALKPHOS, BILITOT, PROT, ALBUMIN in the last 8760 hours. No results for input(s): LIPASE, AMYLASE in the last 8760 hours. No results for input(s): AMMONIA in the last 8760 hours. CBC: Recent Labs    05/24/17 2117 05/25/17 0613  WBC 14.9* 12.2*  HGB 13.9 12.3  HCT 41.1 36.7  MCV 89.0 88.0  PLT 604* 435*   Lipid No results for input(s): CHOL, HDL, LDLCALC, TRIG in the last 8760 hours.  Cardiac Enzymes: No results for input(s): CKTOTAL, CKMB, CKMBINDEX, TROPONINI in the last 8760 hours. BNP: No results for input(s): BNP in the last 8760 hours. No results found for: MICROALBUR No results found for: HGBA1C Lab Results  Component Value Date   TSH 2.942 10/04/2012   No results found for: VITAMINB12 No results found for: FOLATE No results found for: IRON, TIBC, FERRITIN  Imaging and Procedures obtained prior to SNF admission: Ct Head Wo Contrast  Result Date: 05/25/2017 CLINICAL DATA:  Acute onset of dizziness and nausea. EXAM: CT HEAD WITHOUT CONTRAST TECHNIQUE: Contiguous axial images were obtained from the base of the skull through the vertex without intravenous contrast. COMPARISON:  MRI of the brain performed 03/27/2016 FINDINGS: Brain: No evidence of acute  infarction, hemorrhage, hydrocephalus, extra-axial collection or mass lesion / mass effect. Prominence of the ventricles and sulci reflects mild cortical volume loss. Mild cerebellar atrophy is noted. Scattered periventricular and subcortical matter change likely reflects small vessel ischemic microangiopathy. The brainstem and fourth ventricle are within normal limits. The basal ganglia are unremarkable in appearance. The cerebral hemispheres demonstrate grossly normal gray-white differentiation. No mass effect or midline shift is seen. Vascular: No hyperdense vessel or unexpected  calcification. Skull: There is no evidence of fracture; visualized osseous structures are unremarkable in appearance. Sinuses/Orbits: The orbits are within normal limits. The paranasal sinuses and mastoid air cells are well-aerated. Other: No significant soft tissue abnormalities are seen. IMPRESSION: 1. No acute intracranial pathology seen on CT. 2. Mild cortical volume loss and scattered small vessel ischemic microangiopathy. Electronically Signed   By: Garald Balding M.D.   On: 05/25/2017 04:44     Not all labs, radiology exams or other studies done during hospitalization come through on my EPIC note; however they are reviewed by me.    Assessment and Plan  Acute encephalopathy/UTI due to E. coli- patient's urine culture grew out E. coli which was pansensitive including to cephalosporins.  Patient was treated with Rocephin and will be discharged on cephalexin for the next 3 days; patient's blood cultures were negative; patient had episodes of agitation which improved with IV Haldol and Seroquel at the time of her discharge she was improved and close to baseline SNF -admitted for OT/PT; continue Keflex 500 mg every 12 for the next 3 days  Acute kidney injury/hypokalemia-treated with IV fluids and potassium repleted; at time of discharge creatinine 0.95 potassium 3.5 and serum bicarb 22 SNF -follow-up BMP  Advanced  dementia SNF _diagnostically very poor prognosis the patient was encephalopathic during hospitalization; continue Aricept 10 mg p.o. Daily  Hypertension SNF -propranolol was held during hospitalization but restarted on discharge; blood pressure stable;  continue propranolol ER 60 mg daily  Essential tremors SNF -prowler ER 60 mg daily is used for blood pressure as well as essential tremor; will continue  Anxiety SNF -continue Ativan 0.5 mg p.o. Daily  Depression  SNF -continue Zoloft 50 mg nightly   Time spent greater than 45 minutes;> 50% of time with patient was spent reviewing records, labs, tests and studies, counseling and developing plan of care  Webb Silversmith D. Sheppard Coil, MD

## 2017-06-05 ENCOUNTER — Encounter: Payer: Self-pay | Admitting: Internal Medicine

## 2017-06-05 DIAGNOSIS — F329 Major depressive disorder, single episode, unspecified: Secondary | ICD-10-CM | POA: Insufficient documentation

## 2017-06-05 DIAGNOSIS — B9620 Unspecified Escherichia coli [E. coli] as the cause of diseases classified elsewhere: Secondary | ICD-10-CM | POA: Insufficient documentation

## 2017-06-05 DIAGNOSIS — F32A Depression, unspecified: Secondary | ICD-10-CM | POA: Insufficient documentation

## 2017-06-05 DIAGNOSIS — N390 Urinary tract infection, site not specified: Secondary | ICD-10-CM

## 2017-06-10 ENCOUNTER — Encounter: Payer: Self-pay | Admitting: Internal Medicine

## 2017-06-10 ENCOUNTER — Non-Acute Institutional Stay (SKILLED_NURSING_FACILITY): Payer: Medicare Other | Admitting: Internal Medicine

## 2017-06-10 DIAGNOSIS — N179 Acute kidney failure, unspecified: Secondary | ICD-10-CM

## 2017-06-10 DIAGNOSIS — F0280 Dementia in other diseases classified elsewhere without behavioral disturbance: Secondary | ICD-10-CM

## 2017-06-10 DIAGNOSIS — N390 Urinary tract infection, site not specified: Secondary | ICD-10-CM | POA: Diagnosis not present

## 2017-06-10 DIAGNOSIS — G9341 Metabolic encephalopathy: Secondary | ICD-10-CM

## 2017-06-10 DIAGNOSIS — G250 Essential tremor: Secondary | ICD-10-CM

## 2017-06-10 DIAGNOSIS — F329 Major depressive disorder, single episode, unspecified: Secondary | ICD-10-CM

## 2017-06-10 DIAGNOSIS — I10 Essential (primary) hypertension: Secondary | ICD-10-CM

## 2017-06-10 DIAGNOSIS — F32A Depression, unspecified: Secondary | ICD-10-CM

## 2017-06-10 DIAGNOSIS — E876 Hypokalemia: Secondary | ICD-10-CM

## 2017-06-10 DIAGNOSIS — B9620 Unspecified Escherichia coli [E. coli] as the cause of diseases classified elsewhere: Secondary | ICD-10-CM

## 2017-06-10 DIAGNOSIS — F419 Anxiety disorder, unspecified: Secondary | ICD-10-CM

## 2017-06-10 NOTE — Progress Notes (Signed)
Location:  [place].  [employer] of Service:  SNF (31)Skilled nursing facility Provider: Hennie Duos MD  PCP: Bartholome Bill, MD Patient Care Team: Bartholome Bill, MD as PCP - General (Family Medicine)  Extended Emergency Contact Information Primary Emergency Contact: Sheran Lawless States of [place] Phone: [phone] Relation: Daughter Secondary Emergency Contact: Trula Ore States of [place] Phone: [phone] Relation: Son  Allergies  Allergen Reactions  . Zocor [Simvastatin]     Myalgia    Chief Complaint  Patient presents with  . Discharge Note    HPI:  72 y.o. female with hypertension, hyperlipidemia, GERD, anxiety, dystonia, endometriosis, essential tremor, dementia, and obesity who presented to [facility] emergency department with altered mental status and dizziness.  Patient was admitted to [facility] from 4/20-26 for acute metabolic encephalopathy due to UTI in the setting of dementia and was complicated by acute kidney injury and hypokalemia, both of which resolved.  Patient was admitted to skilled nursing facility for OT/PT and is now ready to be discharged home.    Past Medical History:  Diagnosis Date  . Cervical dystonia   . Complication of anesthesia    slow to awaken  . Depression   . Endometriosis    s/p total hysterectomy  . Esophageal reflux   . Essential and other specified forms of tremor   . GERD (gastroesophageal reflux disease)   . Hyperlipidemia   . Hypertension   . Hypopotassemia   . Osteoarthrosis involving, or with mention of more than one site, but not specified as generalized, site unspecified(715.80)   . Osteoarthrosis involving, or with mention of more than one site, but not specified as generalized, site unspecified(715.80)   . 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 Tremors of nervous system   . Trigger finger     Past Surgical  History:  Procedure Laterality Date  . APPENDECTOMY    . CATARACT EXTRACTION Bilateral    OD 07/03/2015, OS 05/29/2015  . CHOLECYSTECTOMY    . COLON SURGERY     bowel resection  . FOOT SURGERY    . JOINT REPLACEMENT     bilateral  . TONSILLECTOMY    . TOTAL ABDOMINAL HYSTERECTOMY    . TOTAL KNEE ARTHROPLASTY     Bilateral  . TRIGGER FINGER RELEASE  02/01/2012   Procedure: RELEASE TRIGGER FINGER/A-1 PULLEY;  Surgeon: Jolyn Nap, MD;  Location: [facility];  Service: Orthopedics;  Laterality: Right;  RIGHT LONG FINGER  TRIGGER FINGER RELEASE  . WISDOM TOOTH EXTRACTION       reports that she has never smoked. She has never used smokeless tobacco. She reports that she drinks alcohol. She reports that she does not use drugs. Social History   Socioeconomic History  . Marital status: Widowed    Spouse name: Ronalee Belts  . Number of children: 2  . Years of education: 37  . Highest education level: Not on file  Occupational History  . Occupation: RETIRED   Social Needs  . Financial resource strain: Not on file  . Food insecurity:    Worry: Not on file    Inability: Not on file  . Transportation needs:    Medical: Not on file    Non-medical: Not on file  Tobacco Use  . Smoking status: Never Smoker  . Smokeless tobacco: Never Used  Substance and Sexual Activity  . Alcohol use: Yes  Comment: Occasional,one glass of wine weekly  . Drug use: No  . Sexual activity: Not Currently    Partners: Male  Lifestyle  . Physical activity:    Days per week: Not on file    Minutes per session: Not on file  . Stress: Not on file  Relationships  . Social connections:    Talks on phone: Not on file    Gets together: Not on file    Attends religious service: Not on file    Active member of club or organization: Not on file    Attends meetings of clubs or organizations: Not on file    Relationship status: Not on file  . Intimate partner violence:    Fear of current or ex  partner: Not on file    Emotionally abused: Not on file    Physically abused: Not on file    Forced sexual activity: Not on file  Other Topics Concern  . Not on file  Social History Narrative   Marital Status:Widowed Ronalee Belts)    Children:  Son Clare Gandy) Daughter [name])    Pets: Dog Terri Piedra)   Living Situation: Lives alone   Occupation: Retired     Education: Geophysicist/field seismologist in [place] Use/Exposure:  None    Alcohol Use:  Occasional 1-2 per week   Drug Use:  None   Diet:  Regular   Exercise:  None   Hobbies: Reading, Quilting   Patient is right handed                      Pertinent  Health Maintenance Due  Topic Date Due  . MAMMOGRAM  09/16/2014  . INFLUENZA VACCINE  09/02/2017  . COLONOSCOPY  11/21/2021  . DEXA SCAN  Completed  . PNA vac Low Risk Adult  Completed    Medications: Allergies as of 06/10/2017      Reactions   Zocor [simvastatin]    Myalgia      Medication List        Accurate as of 06/10/17  4:21 PM. Always use your most recent med list.          clonazePAM 0.5 MG tablet Commonly known as:  KLONOPIN Take 1 tablet (0.5 mg total) by mouth daily.   donepezil 10 MG tablet Commonly known as:  ARICEPT Take 1 tablet (10 mg total) by mouth at bedtime.   multivitamin-iron-minerals-folic acid chewable tablet Chew 1 tablet by mouth daily.   propranolol ER 60 MG 24 hr capsule Commonly known as:  INDERAL LA TAKE ONE CAPSULE BY MOUTH ONE TIME DAILY   traZODone 50 MG tablet Commonly known as:  DESYREL Take 50 mg by mouth.        Vitals:   06/10/17 1612  BP: 100/67  Pulse: 61  Resp: 18  Temp: (!) 97.4 F (36.3 C)   There is no height or weight on file to calculate BMI.  Physical Exam  GENERAL APPEARANCE: Alert, conversant. No acute distress.  HEENT: Unremarkable. RESPIRATORY: Breathing is even, unlabored. Lung sounds are clear   CARDIOVASCULAR: Heart RRR no murmurs, rubs or gallops. No peripheral edema.  GASTROINTESTINAL:  Abdomen is soft, non-tender, not distended w/ normal bowel sounds.  NEUROLOGIC: Cranial nerves 2-12 grossly intact. Moves all extremities   Labs reviewed: Basic Metabolic Panel: Recent Labs    05/25/17 0613 05/26/17 0338 05/27/17 0344 05/28/17 0432  NA 135 140 135 139  K 2.8* 3.2* 3.6 3.7  CL  97* 106 104 108  CO2 26 24 24 22   GLUCOSE 99 126* 83 112*  BUN 17 13 12 6   CREATININE 1.10* 1.23* 1.02* 0.95  CALCIUM 7.9* 7.5* 7.8* 8.0*  MG 1.8  --   --   --    No results found for: [facility] Liver Function Tests: No results for input(s): AST, ALT, ALKPHOS, BILITOT, PROT, ALBUMIN in the last 8760 hours. No results for input(s): LIPASE, AMYLASE in the last 8760 hours. No results for input(s): AMMONIA in the last 8760 hours. CBC: Recent Labs    05/24/17 2117 05/25/17 0613  WBC 14.9* 12.2*  HGB 13.9 12.3  HCT 41.1 36.7  MCV 89.0 88.0  PLT 604* 435*   Lipid No results for input(s): CHOL, HDL, LDLCALC, TRIG in the last 8760 hours. Cardiac Enzymes: No results for input(s): CKTOTAL, CKMB, CKMBINDEX, TROPONINI in the last 8760 hours. BNP: No results for input(s): BNP in the last 8760 hours. CBG: Recent Labs    05/26/17 0630 05/27/17 0734 05/28/17 0743  GLUCAP 114* 86 96    Procedures and Imaging Studies During Stay: Ct Head Wo Contrast  Result Date: 05/25/2017 CLINICAL DATA:  Acute onset of dizziness and nausea. EXAM: CT HEAD WITHOUT CONTRAST TECHNIQUE: Contiguous axial images were obtained from the base of the skull through the vertex without intravenous contrast. COMPARISON:  MRI of the brain performed 03/27/2016 FINDINGS: Brain: No evidence of acute infarction, hemorrhage, hydrocephalus, extra-axial collection or mass lesion / mass effect. Prominence of the ventricles and sulci reflects mild cortical volume loss. Mild cerebellar atrophy is noted. Scattered periventricular and subcortical matter change likely reflects small vessel ischemic microangiopathy. The brainstem and  fourth ventricle are within normal limits. The basal ganglia are unremarkable in appearance. The cerebral hemispheres demonstrate grossly normal gray-white differentiation. No mass effect or midline shift is seen. Vascular: No hyperdense vessel or unexpected calcification. Skull: There is no evidence of fracture; visualized osseous structures are unremarkable in appearance. Sinuses/Orbits: The orbits are within normal limits. The paranasal sinuses and mastoid air cells are well-aerated. Other: No significant soft tissue abnormalities are seen. IMPRESSION: 1. No acute intracranial pathology seen on CT. 2. Mild cortical volume loss and scattered small vessel ischemic microangiopathy. Electronically Signed   By: Garald Balding M.D.   On: 05/25/2017 04:44    Assessment/Plan:   Acute metabolic encephalopathy  E. coli UTI (urinary tract infection)  Acute kidney injury ([place])  Hypokalemia  Dementia associated with other underlying disease without behavioral disturbance  Essential hypertension  Essential tremor  Depression, unspecified depression type  Anxiety   Patient is being discharged with the following home health services: OT/PT/nursing  Patient is being discharged with the following durable medical equipment: Rolling Walker  Patient has been advised to f/u with their PCP in 1-2 weeks to bring them up to date on their rehab stay.  Social services at facility was responsible for arranging this appointment.  Pt was provided with a 30 day supply of prescriptions for medications and refills must be obtained from their PCP.  For controlled substances, a more limited supply may be provided adequate until PCP appointment only.  Medications have been reconciled.  Time spent greater than 30 minutes;> 50% of time with patient was spent reviewing records, labs, tests and studies, counseling and developing plan of care  Inocencio Homes, MD

## 2017-06-27 ENCOUNTER — Other Ambulatory Visit: Payer: Self-pay

## 2017-06-27 ENCOUNTER — Encounter (HOSPITAL_COMMUNITY): Payer: Self-pay

## 2017-06-27 ENCOUNTER — Emergency Department (HOSPITAL_COMMUNITY)
Admission: EM | Admit: 2017-06-27 | Discharge: 2017-06-28 | Disposition: A | Discharge status: Home / Self Care | Payer: Medicare Other | Attending: Emergency Medicine | Admitting: Emergency Medicine

## 2017-06-27 DIAGNOSIS — Z008 Encounter for other general examination: Secondary | ICD-10-CM

## 2017-06-27 DIAGNOSIS — Z8659 Personal history of other mental and behavioral disorders: Secondary | ICD-10-CM | POA: Diagnosis not present

## 2017-06-27 DIAGNOSIS — G3184 Mild cognitive impairment, so stated: Secondary | ICD-10-CM | POA: Insufficient documentation

## 2017-06-27 DIAGNOSIS — Z0279 Encounter for issue of other medical certificate: Secondary | ICD-10-CM | POA: Diagnosis present

## 2017-06-27 DIAGNOSIS — I10 Essential (primary) hypertension: Secondary | ICD-10-CM | POA: Insufficient documentation

## 2017-06-27 DIAGNOSIS — Z79899 Other long term (current) drug therapy: Secondary | ICD-10-CM | POA: Diagnosis not present

## 2017-06-27 DIAGNOSIS — F329 Major depressive disorder, single episode, unspecified: Secondary | ICD-10-CM | POA: Diagnosis not present

## 2017-06-27 LAB — CBC WITH DIFFERENTIAL/PLATELET
BASOS PCT: 0 %
Basophils Absolute: 0 10*3/uL (ref 0.0–0.1)
EOS ABS: 0.1 10*3/uL (ref 0.0–0.7)
Eosinophils Relative: 2 %
HEMATOCRIT: 39.7 % (ref 36.0–46.0)
HEMOGLOBIN: 13 g/dL (ref 12.0–15.0)
Lymphocytes Relative: 33 %
Lymphs Abs: 1.8 10*3/uL (ref 0.7–4.0)
MCH: 29.3 pg (ref 26.0–34.0)
MCHC: 32.7 g/dL (ref 30.0–36.0)
MCV: 89.4 fL (ref 78.0–100.0)
MONOS PCT: 8 %
Monocytes Absolute: 0.4 10*3/uL (ref 0.1–1.0)
NEUTROS ABS: 3.2 10*3/uL (ref 1.7–7.7)
NEUTROS PCT: 57 %
Platelets: 362 10*3/uL (ref 150–400)
RBC: 4.44 MIL/uL (ref 3.87–5.11)
RDW: 13.7 % (ref 11.5–15.5)
WBC: 5.6 10*3/uL (ref 4.0–10.5)

## 2017-06-27 LAB — URINALYSIS, ROUTINE W REFLEX MICROSCOPIC
BACTERIA UA: NONE SEEN
Bilirubin Urine: NEGATIVE
Glucose, UA: NEGATIVE mg/dL
KETONES UR: NEGATIVE mg/dL
Leukocytes, UA: NEGATIVE
Nitrite: NEGATIVE
Protein, ur: NEGATIVE mg/dL
SPECIFIC GRAVITY, URINE: 1.013 (ref 1.005–1.030)
pH: 5 (ref 5.0–8.0)

## 2017-06-27 LAB — COMPREHENSIVE METABOLIC PANEL
ALBUMIN: 4.3 g/dL (ref 3.5–5.0)
ALK PHOS: 67 U/L (ref 38–126)
ALT: 11 U/L — ABNORMAL LOW (ref 14–54)
ANION GAP: 10 (ref 5–15)
AST: 14 U/L — ABNORMAL LOW (ref 15–41)
BILIRUBIN TOTAL: 1 mg/dL (ref 0.3–1.2)
BUN: 11 mg/dL (ref 6–20)
CALCIUM: 9.3 mg/dL (ref 8.9–10.3)
CO2: 23 mmol/L (ref 22–32)
CREATININE: 1.23 mg/dL — AB (ref 0.44–1.00)
Chloride: 108 mmol/L (ref 101–111)
GFR calc non Af Amer: 42 mL/min — ABNORMAL LOW (ref >60)
GFR, EST AFRICAN AMERICAN: 49 mL/min — AB (ref >60)
Glucose, Bld: 102 mg/dL — ABNORMAL HIGH (ref 65–99)
Potassium: 3.8 mmol/L (ref 3.5–5.1)
SODIUM: 141 mmol/L (ref 135–145)
Total Protein: 6.8 g/dL (ref 6.5–8.1)

## 2017-06-27 LAB — ETHANOL: Alcohol, Ethyl (B): <10 mg/dL (ref <10)

## 2017-06-27 LAB — RAPID URINE DRUG SCREEN, HOSP PERFORMED
Amphetamines: NOT DETECTED
BENZODIAZEPINES: NOT DETECTED
Barbiturates: NOT DETECTED
COCAINE: NOT DETECTED
OPIATES: NOT DETECTED
TETRAHYDROCANNABINOL: NOT DETECTED

## 2017-06-27 LAB — ACETAMINOPHEN LEVEL: Acetaminophen (Tylenol), Serum: <10 ug/mL — ABNORMAL LOW (ref 10–30)

## 2017-06-27 LAB — SALICYLATE LEVEL

## 2017-06-27 MED ORDER — FESOTERODINE FUMARATE ER 4 MG PO TB24
4.0000 mg | ORAL_TABLET | Freq: Every day | ORAL | Status: DC
  Administered 2017-06-28: 4 mg via ORAL
  Filled 2017-06-27: qty 1

## 2017-06-27 MED ORDER — DONEPEZIL HCL 5 MG PO TABS
10.0000 mg | ORAL_TABLET | Freq: Every day | ORAL | Status: DC
  Administered 2017-06-27: 10 mg via ORAL
  Filled 2017-06-27: qty 2

## 2017-06-27 MED ORDER — TRAZODONE HCL 50 MG PO TABS
50.0000 mg | ORAL_TABLET | Freq: Every evening | ORAL | Status: DC | PRN
  Administered 2017-06-27: 50 mg via ORAL
  Filled 2017-06-27: qty 1

## 2017-06-27 MED ORDER — CLONAZEPAM 0.5 MG PO TABS
0.5000 mg | ORAL_TABLET | Freq: Every day | ORAL | Status: DC
  Administered 2017-06-27 – 2017-06-28 (×2): 0.5 mg via ORAL
  Filled 2017-06-27 (×2): qty 1

## 2017-06-27 MED ORDER — PANTOPRAZOLE SODIUM 40 MG PO TBEC
40.0000 mg | DELAYED_RELEASE_TABLET | Freq: Every day | ORAL | Status: DC
  Administered 2017-06-27 – 2017-06-28 (×2): 40 mg via ORAL
  Filled 2017-06-27 (×2): qty 1

## 2017-06-27 MED ORDER — PROPRANOLOL HCL ER 60 MG PO CP24
60.0000 mg | ORAL_CAPSULE | Freq: Every day | ORAL | Status: DC
  Administered 2017-06-28: 60 mg via ORAL
  Filled 2017-06-27: qty 1

## 2017-06-27 NOTE — ED Notes (Signed)
Bed: VG68 Expected date:  Expected time:  Means of arrival:  Comments: EMS [id number]-y-[id number] s.i.

## 2017-06-27 NOTE — ED Provider Notes (Signed)
[facility] DEPT Provider Note   CSN: [id number] Arrival date & time: 06/27/17  1334     History   Chief Complaint Chief Complaint  Patient presents with  . Medical Clearance    HPI Alyssa Macdonald is a 72 y.o. female with a reported history of all timers dementia as well as past medical history as listed below who presents to the ED today for medical clearance. History provided by patient and patient's son in law.  Patient reports that the patient recently changed from [facility] to [facility] assisted living 8 days ago.  He reports that the patient has been very anxious, angry when awaking and stating that she does not know where she is.  She has had a hard time with the move.  He reports that she has been calling and texting the family saying she is going to harm herself.  He shows me text messages of the patient texting "why do not I just slash my wrists".   [facility].  Patient's son reports that the patient wakes anxious, angry not knowing where she is.  She has been calling and texting the family stating that she is going to harm herself.  He should be text stating that the patient states "why do not I just/my wrist".  He states that voicemails worse.  They assisted living was concerned that she would harm herself.  They called [employer] and sent here.  Patient denies any history of self-harm at this current time.  No HI. She has not complaints at this time.   HPI  Past Medical History:  Diagnosis Date  . Cervical dystonia   . Complication of anesthesia    slow to awaken  . Depression   . Endometriosis    s/p total hysterectomy  . Esophageal reflux   . Essential and other specified forms of tremor   . GERD (gastroesophageal reflux disease)   . Hyperlipidemia   . Hypertension   . Hypopotassemia   . Osteoarthrosis involving, or with mention of more than one site, but not specified as generalized, site  unspecified(715.80)   . Osteoarthrosis involving, or with mention of more than one site, but not specified as generalized, site unspecified(715.80)   . 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 Tremors of nervous system   . Trigger finger     Patient Active Problem List   Diagnosis Date Noted  . Acute kidney injury ([place]) 06/10/2017  . Essential tremor 06/10/2017  . E. coli UTI (urinary tract infection) 06/05/2017  . Depression 06/05/2017  . UTI (urinary tract infection) 05/25/2017  . Acute metabolic encephalopathy 16/96/7893  . Essential hypertension 05/25/2017  . Anxiety 05/25/2017  . Dementia 05/25/2017  . Hypokalemia 05/25/2017  . Dizziness 05/25/2017  . Encephalopathy 05/25/2017  . 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 Abnormal urine odor 06/18/2013  . Foul smelling urine 06/18/2013  . Vaginitis and vulvovaginitis, unspecified 06/18/2013  . Need for prophylactic vaccination and inoculation against influenza 12/11/2012  . Knee pain, chronic 12/11/2012  . GERD (gastroesophageal reflux disease) 12/11/2012  . Other and unspecified hyperlipidemia 12/11/2012  . Stress due to illness of family member 11/05/2012  . 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 Obesity (BMI 30-39.9) 10/11/2012  . Tremors of nervous system 09/10/2012  . Other malaise and fatigue 09/10/2012  . Leg cramps 09/10/2012  . Osteopenia 09/10/2012    Past Surgical History:  Procedure Laterality Date  .  APPENDECTOMY    . CATARACT EXTRACTION Bilateral    OD 07/03/2015, OS 05/29/2015  . CHOLECYSTECTOMY    . COLON SURGERY     bowel resection  . FOOT SURGERY    . JOINT REPLACEMENT     bilateral  . TONSILLECTOMY    . TOTAL ABDOMINAL HYSTERECTOMY    . TOTAL KNEE ARTHROPLASTY     Bilateral  . TRIGGER FINGER RELEASE  02/01/2012   Procedure: RELEASE TRIGGER FINGER/A-1 PULLEY;  Surgeon: Jolyn Nap, MD;  Location: [facility];  Service: Orthopedics;  Laterality:  Right;  RIGHT LONG FINGER  TRIGGER FINGER RELEASE  . WISDOM TOOTH EXTRACTION       OB History   None      Home Medications    Prior to Admission medications   Medication Sig Start Date End Date Taking? Authorizing Provider  clonazePAM (KLONOPIN) 0.5 MG tablet Take 1 tablet (0.5 mg total) by mouth daily. 05/28/17   Arrien, Jimmy Picket, MD  donepezil (ARICEPT) 10 MG tablet Take 1 tablet (10 mg total) by mouth at bedtime. 02/15/17   Star Age, MD  multivitamin-iron-minerals-folic acid (CENTRUM) chewable tablet Chew 1 tablet by mouth daily.    [provider]  propranolol ER (INDERAL LA) 60 MG 24 hr capsule TAKE ONE CAPSULE BY MOUTH ONE TIME DAILY 08/13/15   Star Age, MD  traZODone (DESYREL) 50 MG tablet Take 50 mg by mouth. 05/23/15   [provider]    Family History Family History  Problem Relation Age of Onset  . CVA Mother   . Alzheimer's disease Mother   . Diabetes Father   . Heart disease Father   . Parkinson's disease Paternal Uncle   . Cataracts Sister   . Breast cancer Neg Hx     Social History Social History   Tobacco Use  . Smoking status: Never Smoker  . Smokeless tobacco: Never Used  Substance Use Topics  . Alcohol use: Yes    Comment: Occasional,one glass of wine weekly  . Drug use: No     Allergies   Zocor [simvastatin]   Review of Systems Review of Systems  All other systems reviewed and are negative.    Physical Exam Updated Vital Signs BP 127/85 (BP Location: Left Arm)   Pulse 66   Temp 98 F (36.7 C)   Resp 16   SpO2 97%   Physical Exam  Constitutional: She appears well-developed and well-nourished.  Pleasant elderly female in no acute distress  HENT:  Head: Normocephalic and atraumatic.  Right Ear: External ear normal.  Left Ear: External ear normal.  Nose: Nose normal.  Mouth/Throat: Uvula is midline, oropharynx is clear and moist and mucous membranes are normal. No tonsillar exudate.  Eyes: Pupils  are equal, round, and reactive to light. Right eye exhibits no discharge. Left eye exhibits no discharge. No scleral icterus.  Neck: Trachea normal. Neck supple. No spinous process tenderness present. No neck rigidity. Normal range of motion present.  Cardiovascular: Normal rate, regular rhythm and intact distal pulses.  No murmur heard. Pulses:      Radial pulses are 2+ on the right side, and 2+ on the left side.       Dorsalis pedis pulses are 2+ on the right side, and 2+ on the left side.       Posterior tibial pulses are 2+ on the right side, and 2+ on the left side.  Pulmonary/Chest: Effort normal and breath sounds normal. She exhibits no  tenderness.  Abdominal: Soft. Bowel sounds are normal. There is no tenderness. There is no rebound and no guarding.  Musculoskeletal: She exhibits no edema.  Lymphadenopathy:    She has no cervical adenopathy.  Neurological: She is alert.  Speech clear. Follows commands. No facial droop. PERRLA. EOM grossly intact. CN III-XII grossly intact. Grossly moves all extremities 4 without ataxia.    Skin: Skin is warm and dry. No rash noted. She is not diaphoretic.  No marks or abrasions noted skin  Psychiatric: She has a normal mood and affect.  Nursing note and vitals reviewed.    ED Treatments / Results  Labs (all labs ordered are listed, but only abnormal results are displayed) Labs Reviewed  COMPREHENSIVE METABOLIC PANEL - Abnormal; Notable for the following components:      Result Value   Glucose, Bld 102 (*)    Creatinine, Ser 1.23 (*)    AST 14 (*)    ALT 11 (*)    GFR calc non Af Amer 42 (*)    GFR calc Af Amer 49 (*)    All other components within normal limits  ACETAMINOPHEN LEVEL - Abnormal; Notable for the following components:   Acetaminophen (Tylenol), Serum <10 (*)    All other components within normal limits  URINALYSIS, ROUTINE W REFLEX MICROSCOPIC - Abnormal; Notable for the following components:   Hgb urine dipstick SMALL (*)     All other components within normal limits  CBC WITH DIFFERENTIAL/PLATELET  ETHANOL  SALICYLATE LEVEL  RAPID URINE DRUG SCREEN, HOSP PERFORMED    EKG None  Radiology No results found.  Procedures Procedures (including critical care time)  Medications Ordered in ED Medications  clonazePAM (KLONOPIN) tablet 0.5 mg (0.5 mg Oral Given 06/27/17 1712)  donepezil (ARICEPT) tablet 10 mg (has no administration in time range)  pantoprazole (PROTONIX) EC tablet 40 mg (40 mg Oral Given 06/27/17 1712)  propranolol ER (INDERAL LA) 24 hr capsule 60 mg (has no administration in time range)  fesoterodine (TOVIAZ) tablet 4 mg (has no administration in time range)  traZODone (DESYREL) tablet 50 mg (has no administration in time range)     Initial Impression / Assessment and Plan / ED Course  I have reviewed the triage vital signs and the nursing notes.  Pertinent labs & imaging results that were available during my care of the patient were reviewed by me and considered in my medical decision making (see chart for details).     72 y.o. female with a reported history of all timers dementia as well as past medical history as listed below who presents to the ED today for medical clearance. History provided by patient and patient's son in law.  Patient reports that the patient recently changed from [facility] to [facility] assisted living 8 days ago.  He reports that the patient has been very anxious, angry when awaking and stating that she does not know where she is.  She has had a hard time with the move.  He reports that she has been calling and texting the family saying she is going to harm herself.  He shows me text messages of the patient texting "why do not I just slash my wrists".   [facility].  Patient's son reports that the patient wakes anxious, angry not knowing where she is.  She has been calling and texting the family stating that she is going to  harm herself.  He should be text  stating that the patient states "why do not I just/my wrist".  He states that voicemails worse.  They assisted living was concerned that she would harm herself.  They called [employer] and sent here.  Patient denies any history of self-harm at this current time.  No HI.  Patient labs reviewed and reassuring.  No leukocytosis.  No anemia.  No significant elect light derangements.  No evidence of DKA.  Mild elevation of creatinine at 1.23 that is similar to prior.  Alcohol, salicylate and acetaminophen levels are reassuring.  UDS negative.  UA without evidence of UTI.  Patient is medically cleared for evaluation of TTS. Home medications re-ordered.   TTS recommends overnight observation with psych evaluation in the morning.   Patient case seen and discussed with Dr. Ralene Bathe who is in agreement with plan.   Final Clinical Impressions(s) / ED Diagnoses   Final diagnoses:  Encounter for medical clearance for patient hold  History of dementia    ED Discharge Orders    None       Lorelle Gibbs 06/27/17 Corky Mull, MD 06/28/17 [phone]

## 2017-06-27 NOTE — ED Notes (Signed)
Pt has arrived back to room 31, ambulatory, sitter present

## 2017-06-27 NOTE — ED Notes (Signed)
Pt stated is unable to remove rings d/t being too tight.

## 2017-06-27 NOTE — ED Notes (Signed)
Pt up walking around in room, making bed.  Pt stated "I thought it was time to get up."  Pt oriented to time & place.

## 2017-06-27 NOTE — ED Triage Notes (Addendum)
Pt from [facility] (formerly [facility]) after pt son called EMS reporting that pt has red marks on neck and red marks on wrist. Pt son is concerned about pt harming herself. Pt denies self harm and sts that if she wanted to harm herself, she would know how. Pt has a scratch to her LAC that her son was also concerned about. EMS reports that there are no visible marks to pts neck or wrist. Pt is calm and cooperative. Pt has hx of dementia. Pt A&O x3 and in NAD

## 2017-06-27 NOTE — BH Assessment (Addendum)
Assessment Note  Alyssa Macdonald is an 72 y.o. female. Patient presents voluntarily to [facility] brought in by EMS. Patient is oriented to person. She thinks she is in a "doctor's office". Pt is pleasant and at times makes jokes. She is also tearful at times. She denies a history of suicide attempts. She also denies suicidal ideation. Pt reports her mother had bipolar disorder. She reports euthymic mood. When pt was asked if she remembered sending the voicemail, pt answers "yes". When asked what the purpose of the voicemail was, pt says, "Shock value". At time of assessment, pt does not have visible marks on her wrists or arms. She answers "no" when asked if she remembers searching for the ATM machine today or making red marks on her wrists. Pt has visible tremors.   Son Alyssa Macdonald is bedside with patient at time of assessment. He reports he had received texts from pt this am. He says that pt sent him a voicemail this am in which she says "I'm slitting my wrists as we speak." Son says he interpreted the texts and voicemail like a sort of tantrum. He says that pt has had a hard time adjusting to [facility]. He says that pt often thinks she is [place] and that her family moved all her things to this hotel room.   Collateral info provided by [facility] staff [phone] Freddie Breech RN, Residential Care Director, and [place] office. They report they received call from pt's son in law this am notifying them pt had been sending texts threatening pt would harm herself. They report pt was unaware that she was moving to [facility] until her children told her as they were driving her to [place] to move in. They say pt has had difficult time acclimating to facility. They checked on pt after son in [place] call and she was wandering down hall with debit card looking for an ATM machine. Apparently patient had red marks on her wrists. When staff asked about the marks, pt said, "If I  have to stay here, I"ll follow through." Per staff, pt would not elaborate on statement. Per staff, pt would not tell them what she had used to make wrist marks. When writer explains the provider recommendation that pt stay in ED overnight with reevaluation in the am, [facility] staff say that pt has to have a geropsych admission prior to returning. They state that staying in ED overnight will not help.   Diagnosis: Major Neurocognitive Disorder  Past Medical History:  Past Medical History:  Diagnosis Date  . Cervical dystonia   . Complication of anesthesia    slow to awaken  . Depression   . Endometriosis    s/p total hysterectomy  . Esophageal reflux   . Essential and other specified forms of tremor   . GERD (gastroesophageal reflux disease)   . Hyperlipidemia   . Hypertension   . Hypopotassemia   . Osteoarthrosis involving, or with mention of more than one site, but not specified as generalized, site unspecified(715.80)   . Osteoarthrosis involving, or with mention of more than one site, but not specified as generalized, site unspecified(715.80)   . 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 Tremors of nervous system   . Trigger finger     Past Surgical History:  Procedure Laterality Date  . APPENDECTOMY    . CATARACT EXTRACTION Bilateral    OD 07/03/2015, OS 05/29/2015  . CHOLECYSTECTOMY    .  COLON SURGERY     bowel resection  . FOOT SURGERY    . JOINT REPLACEMENT     bilateral  . TONSILLECTOMY    . TOTAL ABDOMINAL HYSTERECTOMY    . TOTAL KNEE ARTHROPLASTY     Bilateral  . TRIGGER FINGER RELEASE  02/01/2012   Procedure: RELEASE TRIGGER FINGER/A-1 PULLEY;  Surgeon: Jolyn Nap, MD;  Location: [facility];  Service: Orthopedics;  Laterality: Right;  RIGHT LONG FINGER  TRIGGER FINGER RELEASE  . WISDOM TOOTH EXTRACTION      Family History:  Family History  Problem Relation Age of Onset  . CVA Mother   . Alzheimer's disease Mother   . Diabetes  Father   . Heart disease Father   . Parkinson's disease Paternal Uncle   . Cataracts Sister   . Breast cancer Neg Hx     Social History:  reports that she has never smoked. She has never used smokeless tobacco. She reports that she drinks alcohol. She reports that she does not use drugs.  Additional Social History:  Alcohol / Drug Use Pain Medications: pt denies abuse - see pta meds list Prescriptions: pt denies abuse- see pta meds list Over the Counter: pt denies abuse - see pta meds list History of alcohol / drug use?: No history of alcohol / drug abuse Longest period of sobriety (when/how long): N/A  CIWA: CIWA-Ar BP: 127/85 Pulse Rate: 66 COWS:    Allergies:  Allergies  Allergen Reactions  . Zocor [Simvastatin]     Myalgia    Home Medications:  (Not in a hospital admission)  OB/GYN Status:  No LMP recorded. Patient has had a hysterectomy.  General Assessment Data Location of Assessment: [facility] ED TTS Assessment: In system Is this a Tele or Face-to-Face Assessment?: Face-to-Face Is this an Initial Assessment or a Re-assessment for this encounter?: Initial Assessment Marital status: Widowed Is patient pregnant?: No Pregnancy Status: No Living Arrangements: Other (Comment)([facility] [place]) Can pt return to current living arrangement?: Yes Admission Status: Voluntary Is patient capable of signing voluntary admission?: No Referral Source: Self/Family/Friend Insurance type: united Electrical engineer     Crisis Care Plan Living Arrangements: Other (Comment)([facility]) Legal Guardian: (herself) Name of Psychiatrist: none Name of Therapist: none  Education Status Is patient currently in school?: No Is the patient employed, unemployed or receiving disability?: Unemployed  Risk to self with the past 6 months Suicidal Ideation: Yes-Currently Present(patient denies) Has patient been a risk to self within the past 6 months prior to admission? :  No Suicidal Intent: No Has patient had any suicidal intent within the past 6 months prior to admission? : No Is patient at risk for suicide?: No Suicidal Plan?: No Has patient had any suicidal plan within the past 6 months prior to admission? : No What has been your use of drugs/alcohol within the last 12 months?: none Previous Attempts/Gestures: No How many times?: 0 Other Self Harm Risks: none Triggers for Past Attempts: (N/A) Intentional Self Injurious Behavior: None Family Suicide History: No Recent stressful life event(s): Other (Comment)(moved to [place] 8 days ago) Persecutory voices/beliefs?: No Depression: Yes Depression Symptoms: Tearfulness, Loss of interest in usual pleasures Substance abuse history and/or treatment for substance abuse?: No Suicide prevention information given to non-admitted patients: Not applicable  Risk to Others within the past 6 months Homicidal Ideation: No Does patient have any lifetime risk of violence toward others beyond the six months prior to admission? : No Thoughts of Harm to Others:  No Current Homicidal Intent: No Current Homicidal Plan: No Access to Homicidal Means: No Identified Victim: none History of harm to others?: No Assessment of Violence: None Noted Violent Behavior Description: pt denies history of violence Does patient have access to weapons?: No Criminal Charges Pending?: No Does patient have a court date: No Is patient on probation?: No  Psychosis Hallucinations: None noted Delusions: (pt thought she was in hotel room at beach)  Mental Status Report Appearance/Hygiene: Unremarkable(in street clothing appropriate for weather) Eye Contact: Good Motor Activity: Freedom of movement Speech: Logical/coherent Level of Consciousness: Alert, Quiet/awake Mood: Euthymic Affect: Other (Comment), Anxious(euthymic) Anxiety Level: Minimal Thought Processes: Relevant, Coherent Judgement: Partial Orientation: Person(thinks she  is in MD's office) Obsessive Compulsive Thoughts/Behaviors: None  Cognitive Functioning Concentration: Decreased Memory: Recent Impaired, Remote Impaired Is patient IDD: No Is patient DD?: No Insight: Poor Impulse Control: Poor Appetite: Fair Have you had any weight changes? : No Change Sleep: No Change Vegetative Symptoms: None  ADLScreening [facility] Assessment Services) Patient's cognitive ability adequate to safely complete daily activities?: No Patient able to express need for assistance with ADLs?: Yes Independently performs ADLs?: Yes (appropriate for developmental age)  Prior Inpatient Therapy Prior Inpatient Therapy: No  Prior Outpatient Therapy Prior Outpatient Therapy: No Does patient have an ACCT team?: No Does patient have Intensive In-House Services?  : No Does patient have [facility] services? : No Does patient have P4CC services?: No  ADL Screening (condition at time of admission) Patient's cognitive ability adequate to safely complete daily activities?: No Is the patient deaf or have difficulty hearing?: No Does the patient have difficulty seeing, even when wearing glasses/contacts?: No Does the patient have difficulty concentrating, remembering, or making decisions?: Yes Patient able to express need for assistance with ADLs?: Yes Does the patient have difficulty dressing or bathing?: No Independently performs ADLs?: Yes (appropriate for developmental age) Does the patient have difficulty walking or climbing stairs?: No Weakness of Legs: None Weakness of Arms/Hands: None  Home Assistive Devices/Equipment Home Assistive Devices/Equipment: Eyeglasses    Abuse/Neglect Assessment (Assessment to be complete while patient is alone) Abuse/Neglect Assessment Can Be Completed: Yes Physical Abuse: Denies Verbal Abuse: Denies Sexual Abuse: Denies Exploitation of patient/patient's resources: Denies Self-Neglect: Denies     Regulatory affairs officer (For Healthcare) Does  Patient Have a Medical Advance Directive?: No Would patient like information on creating a medical advance directive?: No - Patient declined    Additional Information 1:1 In Past 12 Months?: No CIRT Risk: No Elopement Risk: No Does patient have medical clearance?: Yes     Disposition:  Disposition Initial Assessment Completed for this Encounter: Yes Disposition of Patient: (keep in ED overnight for safety and psych eval in am)  On Site Evaluation by:   Reviewed with Physician:    Leron Croak P 06/27/2017 5:35 PM

## 2017-06-28 DIAGNOSIS — Z8659 Personal history of other mental and behavioral disorders: Secondary | ICD-10-CM | POA: Diagnosis not present

## 2017-06-28 DIAGNOSIS — G3184 Mild cognitive impairment, so stated: Secondary | ICD-10-CM | POA: Diagnosis not present

## 2017-06-28 DIAGNOSIS — Z008 Encounter for other general examination: Secondary | ICD-10-CM | POA: Insufficient documentation

## 2017-06-28 NOTE — [facility] Suicide Risk Assessment (Cosign Needed)
Suicide Risk Assessment  Discharge Assessment   [facility] Discharge Suicide Risk Assessment   Principal Problem: History of dementia Discharge Diagnoses:  Patient Active Problem List   Diagnosis Date Noted  . Encounter for medical clearance for patient hold [Z00.8]   . History of dementia [Z86.59]   . Acute kidney injury ([place]) [N17.9] 06/10/2017  . Essential tremor [G25.0] 06/10/2017  . E. coli UTI (urinary tract infection) [N39.0, B96.20] 06/05/2017  . Depression [F32.9] 06/05/2017  . UTI (urinary tract infection) [N39.0] 05/25/2017  . Acute metabolic encephalopathy [U20.25] 05/25/2017  . Essential hypertension [I10] 05/25/2017  . Anxiety [F41.9] 05/25/2017  . Dementia [F03.90] 05/25/2017  . Hypokalemia [E87.6] 05/25/2017  . Dizziness [R42] 05/25/2017  . Encephalopathy [G93.40] 05/25/2017  . 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 Abnormal urine odor [R82.90] 06/18/2013  . Foul smelling urine [R82.90] 06/18/2013  . Vaginitis and vulvovaginitis, unspecified [N76.0] 06/18/2013  . Need for prophylactic vaccination and inoculation against influenza [Z23] 12/11/2012  . Knee pain, chronic [M25.569, G89.29] 12/11/2012  . GERD (gastroesophageal reflux disease) [K21.9] 12/11/2012  . Other and unspecified hyperlipidemia [E78.5] 12/11/2012  . Stress due to illness of family member [Z63.79] 11/05/2012  . 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 Obesity (BMI 30-39.9) [E66.9] 10/11/2012  . Tremors of nervous system [R25.1] 09/10/2012  . Other malaise and fatigue [R53.81, R53.83] 09/10/2012  . Leg cramps [R25.2] 09/10/2012  . Osteopenia [M85.80] 09/10/2012    Total Time spent with patient: 45 minutes  Musculoskeletal: Strength & Muscle Tone: within normal limits Gait & Station: normal Patient leans: N/A  Psychiatric Specialty Exam: Physical Exam  Constitutional: She is oriented to person, place, and time. She appears well-developed and  well-nourished.  HENT:  Head: Normocephalic.  Respiratory: Effort normal.  Musculoskeletal: Normal range of motion.  Neurological: She is alert and oriented to person, place, and time.  Psychiatric: She has a normal mood and affect. Her speech is normal and behavior is normal. Thought content normal. She expresses impulsivity. She exhibits abnormal recent memory and abnormal remote memory.   Review of Systems  Psychiatric/Behavioral: Positive for memory loss. Negative for depression, hallucinations, substance abuse and suicidal ideas. The patient is not nervous/anxious and does not have insomnia.   All other systems reviewed and are negative.  Blood pressure 134/77, pulse (!) 59, temperature (!) 97.5 F (36.4 C), temperature source Oral, resp. rate 15, SpO2 94 %.There is no height or weight on file to calculate BMI. General Appearance: Casual Eye Contact:  Good Speech:  Clear and Coherent Volume:  Normal Mood:  Euthymic Affect:  Congruent Thought Process:  Disorganized Orientation:  Full (Time, Place, and Person) Thought Content:  Illogical Suicidal Thoughts:  No Homicidal Thoughts:  No Memory:  Immediate;   Fair Recent;   Fair Remote;   Poor Judgement:  Impaired Insight:  Shallow Psychomotor Activity:  Normal Concentration:  Concentration: Good and Attention Span: Good Recall:  [employer] of Knowledge:  Fair Language:  Good Akathisia:  No Handed:  Right AIMS (if indicated):    Assets:  Agricultural consultant Housing Social Support ADL's:  Intact Cognition:  Impaired,  Moderate Sleep:   Good   Mental Status Per Nursing Assessment::   On Admission:   Confused and dementia  Demographic Factors:  Age 56 or older  Loss Factors: NA  Historical Factors: Impulsivity  Risk Reduction Factors:   Sense of responsibility to family and Living with another person, especially a relative  Continued Clinical  Symptoms:  Depression:    Impulsivity  Cognitive Features That Contribute To Risk:  Loss of executive function    Suicide Risk:  Minimal: No identifiable suicidal ideation.  Patients presenting with no risk factors but with morbid ruminations; may be classified as minimal risk based on the severity of the depressive symptoms    Plan Of Care/Follow-up recommendations:  Activity:  as tolerated Diet:  Heart Healthy  Ethelene Hal, NP 06/28/2017, 12:12 PM

## 2017-06-28 NOTE — Consult Note (Addendum)
[facility] Consult   Reason for Consult:  Dementia Referring Physician:  EDP Patient Identification: Alyssa Macdonald MRN:  [id number] Principal Diagnosis: History of dementia Diagnosis:   Patient Active Problem List   Diagnosis Date Noted  . Encounter for medical clearance for patient hold [Z00.8]   . History of dementia [Z86.59]   . Acute kidney injury ([place]) [N17.9] 06/10/2017  . Essential tremor [G25.0] 06/10/2017  . E. coli UTI (urinary tract infection) [N39.0, B96.20] 06/05/2017  . Depression [F32.9] 06/05/2017  . UTI (urinary tract infection) [N39.0] 05/25/2017  . Acute metabolic encephalopathy [I71.24] 05/25/2017  . Essential hypertension [I10] 05/25/2017  . Anxiety [F41.9] 05/25/2017  . Dementia [F03.90] 05/25/2017  . Hypokalemia [E87.6] 05/25/2017  . Dizziness [R42] 05/25/2017  . Encephalopathy [G93.40] 05/25/2017  . 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 Abnormal urine odor [R82.90] 06/18/2013  . Foul smelling urine [R82.90] 06/18/2013  . Vaginitis and vulvovaginitis, unspecified [N76.0] 06/18/2013  . Need for prophylactic vaccination and inoculation against influenza [Z23] 12/11/2012  . Knee pain, chronic [M25.569, G89.29] 12/11/2012  . GERD (gastroesophageal reflux disease) [K21.9] 12/11/2012  . Other and unspecified hyperlipidemia [E78.5] 12/11/2012  . Stress due to illness of family member [Z63.79] 11/05/2012  . 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 Obesity (BMI 30-39.9) [E66.9] 10/11/2012  . Tremors of nervous system [R25.1] 09/10/2012  . Other malaise and fatigue [R53.81, R53.83] 09/10/2012  . Leg cramps [R25.2] 09/10/2012  . Osteopenia [M85.80] 09/10/2012    Total Time spent with patient: 45 minutes  Subjective:   Alyssa Macdonald is a 72 y.o. female patient admitted with confusion and dementia.  HPI: Pt was seen and chart reviewed with treatment team and Dr Mariea Clonts. Pt denies suicidal/homicidal  ideation, denies auditory/visual hallucinations and does not appear to be responding to internal stimuli. Pt stated that she told her son she was going to slice her wrist for the "drama of it." Pt is alert and oriented to self. Place but is unable to tell the correct date or year and does not know why she is at the hospital. Pt was moved into an assisted living facility 8 days ago and has been more confused ever since. Pt was not aware she was going to the ALF, her children didn't tell before they took her there. Pt is pleasant, calm and cooperative here in the ED. Pt is stable and psychiatrically clear. SW will assist with transfer of pt back to her ALF.   Past Psychiatric History: As above  Risk to Self: None Risk to Others: None Prior Inpatient Therapy: Prior Inpatient Therapy: No Prior Outpatient Therapy: Prior Outpatient Therapy: No Does patient have an ACCT team?: No Does patient have Intensive In-House Services?  : No Does patient have [facility] services? : No Does patient have P4CC services?: No  Past Medical History:  Past Medical History:  Diagnosis Date  . Cervical dystonia   . Complication of anesthesia    slow to awaken  . Depression   . Endometriosis    s/p total hysterectomy  . Esophageal reflux   . Essential and other specified forms of tremor   . GERD (gastroesophageal reflux disease)   . Hyperlipidemia   . Hypertension   . Hypopotassemia   . Osteoarthrosis involving, or with mention of more than one site, but not specified as generalized, site unspecified(715.80)   . Osteoarthrosis involving, or with mention of more than one site, but not specified as generalized, site unspecified(715.80)   .  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 Tremors of nervous system   . Trigger finger     Past Surgical History:  Procedure Laterality Date  . APPENDECTOMY    . CATARACT EXTRACTION Bilateral    OD 07/03/2015, OS 05/29/2015  . CHOLECYSTECTOMY    . COLON SURGERY      bowel resection  . FOOT SURGERY    . JOINT REPLACEMENT     bilateral  . TONSILLECTOMY    . TOTAL ABDOMINAL HYSTERECTOMY    . TOTAL KNEE ARTHROPLASTY     Bilateral  . TRIGGER FINGER RELEASE  02/01/2012   Procedure: RELEASE TRIGGER FINGER/A-1 PULLEY;  Surgeon: Jolyn Nap, MD;  [facility];  Service: Orthopedics;  Laterality: Right;  RIGHT LONG FINGER  TRIGGER FINGER RELEASE  . WISDOM TOOTH EXTRACTION     Family History:  Family History  Problem Relation Age of Onset  . CVA Mother   . Alzheimer's disease Mother   . Diabetes Father   . Heart disease Father   . Parkinson's disease Paternal Uncle   . Cataracts Sister   . Breast cancer Neg Hx    Family Psychiatric  History: Mother-Alzheimer's Disease. Social History:  Social History   Substance and Sexual Activity  Alcohol Use Yes   Comment: Occasional,one glass of wine weekly     Social History   Substance and Sexual Activity  Drug Use No    Social History   Socioeconomic History  . Marital status: Widowed    Spouse name: Ronalee Belts  . Number of children: 2  . Years of education: 30  . Highest education level: Not on file  Occupational History  . Occupation: RETIRED   Social Needs  . Financial resource strain: Not on file  . Food insecurity:    Worry: Not on file    Inability: Not on file  . Transportation needs:    Medical: Not on file    Non-medical: Not on file  Tobacco Use  . Smoking status: Never Smoker  . Smokeless tobacco: Never Used  Substance and Sexual Activity  . Alcohol use: Yes    Comment: Occasional,one glass of wine weekly  . Drug use: No  . Sexual activity: Not Currently    Partners: Male  Lifestyle  . Physical activity:    Days per week: Not on file    Minutes per session: Not on file  . Stress: Not on file  Relationships  . Social connections:    Talks on phone: Not on file    Gets together: Not on file    Attends religious service: Not on file    Active  member of club or organization: Not on file    Attends meetings of clubs or organizations: Not on file    Relationship status: Not on file  Other Topics Concern  . Not on file  Social History Narrative   Marital Status:Widowed Ronalee Belts)    Children:  Son Clare Gandy) Daughter [name])    Pets: Dog Terri Piedra)   Living Situation: Lives alone   Occupation: Retired     Education: Geophysicist/field seismologist in [employer] Use/Exposure:  None    Alcohol Use:  Occasional 1-2 per week   Drug Use:  None   Diet:  Regular   Exercise:  None   Hobbies: Reading, Quilting   Patient is right handed  Additional Social History: N/A    Allergies:   Allergies  Allergen Reactions  . Zocor [Simvastatin]     Myalgia    Labs:  Results for orders placed or performed during the hospital encounter of 06/27/17 (from the past 48 hour(s))  CBC with Differential/Platelet     Status: None   Collection Time: 06/27/17  2:45 PM  Result Value Ref Range   WBC 5.6 4.0 - 10.5 K/uL   RBC 4.44 3.87 - 5.11 MIL/uL   Hemoglobin 13.0 12.0 - 15.0 g/dL   HCT 39.7 36.0 - 46.0 %   MCV 89.4 78.0 - 100.0 fL   MCH 29.3 26.0 - 34.0 pg   MCHC 32.7 30.0 - 36.0 g/dL   RDW 13.7 11.5 - 15.5 %   Platelets 362 150 - 400 K/uL   Neutrophils Relative % 57 %   Neutro Abs 3.2 1.7 - 7.7 K/uL   Lymphocytes Relative 33 %   Lymphs Abs 1.8 0.7 - 4.0 K/uL   Monocytes Relative 8 %   Monocytes Absolute 0.4 0.1 - 1.0 K/uL   Eosinophils Relative 2 %   Eosinophils Absolute 0.1 0.0 - 0.7 K/uL   Basophils Relative 0 %   Basophils Absolute 0.0 0.0 - 0.1 K/uL    Comment: Performed at [facility], [place] [address] [place], [place] [address]  Comprehensive metabolic panel     Status: Abnormal   Collection Time: 06/27/17  2:45 PM  Result Value Ref Range   Sodium 141 135 - 145 mmol/L   Potassium 3.8 3.5 - 5.1 mmol/L   Chloride 108 101 - 111 mmol/L   CO2 23 22 - 32 mmol/L   Glucose, Bld 102 (H) 65 - 99  mg/dL   BUN 11 6 - 20 mg/dL   Creatinine, Ser 1.23 (H) 0.44 - 1.00 mg/dL   Calcium 9.3 8.9 - 10.3 mg/dL   Total Protein 6.8 6.5 - 8.1 g/dL   Albumin 4.3 3.5 - 5.0 g/dL   AST 14 (L) 15 - 41 U/L   ALT 11 (L) 14 - 54 U/L   Alkaline Phosphatase 67 38 - 126 U/L   Total Bilirubin 1.0 0.3 - 1.2 mg/dL   GFR calc non Af Amer 42 (L) >60 mL/min   GFR calc Af Amer 49 (L) >60 mL/min    Comment: (NOTE) The eGFR has been calculated using the CKD EPI equation. This calculation has not been validated in all clinical situations. eGFR's persistently <60 mL/min signify possible Chronic Kidney Disease.    Anion gap 10 5 - 15    Comment: Performed at [facility], [place] [address], [place], [place] [address]  Ethanol     Status: None   Collection Time: 06/27/17  2:45 PM  Result Value Ref Range   Alcohol, Ethyl (B) <10 <10 mg/dL    Comment: (NOTE) Lowest detectable limit for serum alcohol is 10 mg/dL. For medical purposes only. Performed at [facility], [place] [address] [place], [place] [address]   Salicylate level     Status: None   Collection Time: 06/27/17  2:45 PM  Result Value Ref Range   Salicylate Lvl <1.7 2.8 - 30.0 mg/dL    Comment: Performed at [facility], [place] [address], [place],  [address]  Acetaminophen level     Status: Abnormal   Collection Time: 06/27/17  2:45 PM  Result Value Ref Range   Acetaminophen (Tylenol), Serum <10 (L) 10 - 30  ug/mL    Comment: (NOTE) Therapeutic concentrations vary significantly. A range of 10-30 ug/mL  may be an effective concentration for many patients. However, some  are best treated at concentrations outside of this range. Acetaminophen concentrations >150 ug/mL at 4 hours after ingestion  and >50 ug/mL at 12 hours after ingestion are often associated with  toxic reactions. Performed at [facility], [place] [address], [place], [place] [address]   Rapid urine drug  screen (hospital performed)     Status: None   Collection Time: 06/27/17  2:45 PM  Result Value Ref Range   Opiates NONE DETECTED NONE DETECTED   Cocaine NONE DETECTED NONE DETECTED   Benzodiazepines NONE DETECTED NONE DETECTED   Amphetamines NONE DETECTED NONE DETECTED   Tetrahydrocannabinol NONE DETECTED NONE DETECTED   Barbiturates NONE DETECTED NONE DETECTED    Comment: (NOTE) DRUG SCREEN FOR MEDICAL PURPOSES ONLY.  IF CONFIRMATION IS NEEDED FOR ANY PURPOSE, NOTIFY LAB WITHIN 5 DAYS. LOWEST DETECTABLE LIMITS FOR URINE DRUG SCREEN Drug Class                     Cutoff (ng/mL) Amphetamine and metabolites    1000 Barbiturate and metabolites    200 Benzodiazepine                 765 Tricyclics and metabolites     300 Opiates and metabolites        300 Cocaine and metabolites        300 THC                            50 Performed at [facility], [place] [address], [place], [place] [address]   Urinalysis, Routine w reflex microscopic     Status: Abnormal   Collection Time: 06/27/17  2:45 PM  Result Value Ref Range   Color, Urine YELLOW YELLOW   APPearance CLEAR CLEAR   Specific Gravity, Urine 1.013 1.005 - 1.030   pH 5.0 5.0 - 8.0   Glucose, UA NEGATIVE NEGATIVE mg/dL   Hgb urine dipstick SMALL (A) NEGATIVE   Bilirubin Urine NEGATIVE NEGATIVE   Ketones, ur NEGATIVE NEGATIVE mg/dL   Protein, ur NEGATIVE NEGATIVE mg/dL   Nitrite NEGATIVE NEGATIVE   Leukocytes, UA NEGATIVE NEGATIVE   RBC / HPF 0-5 0 - 5 RBC/hpf   WBC, UA 0-5 0 - 5 WBC/hpf   Bacteria, UA NONE SEEN NONE SEEN   Squamous Epithelial / LPF 0-5 0 - 5   Mucus PRESENT     Comment: Performed at [facility], [place] [address], [place], [place] [address]    Current Facility-Administered Medications  Medication Dose Route Frequency Provider Last Rate Last Dose  . clonazePAM (KLONOPIN) tablet 0.5 mg  0.5 mg Oral Daily Jillyn Ledger, PA-C   0.5 mg at 06/28/17 8127  . donepezil  (ARICEPT) tablet 10 mg  10 mg Oral QHS Jillyn Ledger, PA-C   10 mg at 06/27/17 2200  . fesoterodine (TOVIAZ) tablet 4 mg  4 mg Oral Daily Jillyn Ledger, PA-C   4 mg at 06/28/17 5170  . incobotulinumtoxinA (XEOMIN) 100 units injection 100 Units  100 Units Intramuscular Q90 days Marcial Pacas, MD   100 Units at 12/11/15 1827  . pantoprazole (PROTONIX) EC tablet 40 mg  40 mg Oral Daily Jillyn Ledger, PA-C   40 mg at 06/28/17 0174  . propranolol ER (  INDERAL LA) 24 hr capsule 60 mg  60 mg Oral Daily Jillyn Ledger, PA-C   60 mg at 06/28/17 1287  . traZODone (DESYREL) tablet 50 mg  50 mg Oral QHS PRN Jillyn Ledger, PA-C   50 mg at 06/27/17 2350   Current Outpatient Medications  Medication Sig Dispense Refill  . clonazePAM (KLONOPIN) 0.5 MG tablet Take 1 tablet (0.5 mg total) by mouth daily. 10 tablet 0  . donepezil (ARICEPT) 10 MG tablet Take 1 tablet (10 mg total) by mouth at bedtime. 90 tablet 3  . omeprazole (PRILOSEC) 10 MG capsule Take 10 mg by mouth daily.    . propranolol ER (INDERAL LA) 60 MG 24 hr capsule TAKE ONE CAPSULE BY MOUTH ONE TIME DAILY 90 capsule 4  . tolterodine (DETROL LA) 2 MG 24 hr capsule Take 2 mg by mouth daily.    . traZODone (DESYREL) 50 MG tablet Take 50 mg by mouth.      Musculoskeletal: Strength & Muscle Tone: within normal limits Gait & Station: normal Patient leans: N/A  Psychiatric Specialty Exam: Physical Exam  Nursing note and vitals reviewed. Constitutional: She appears well-developed and well-nourished.  HENT:  Head: Normocephalic and atraumatic.  Neck: Normal range of motion.  Respiratory: Effort normal.  Musculoskeletal: Normal range of motion.  Neurological: She is alert.  Oriented to person and president only.  Psychiatric: She has a normal mood and affect. Her speech is normal and behavior is normal. Thought content normal. She expresses impulsivity. She exhibits abnormal recent memory and abnormal remote memory.    Review of  Systems  Psychiatric/Behavioral: Positive for memory loss. Negative for depression, hallucinations, substance abuse and suicidal ideas. The patient is not nervous/anxious and does not have insomnia.   All other systems reviewed and are negative.   Blood pressure 134/77, pulse (!) 59, temperature (!) 97.5 F (36.4 C), temperature source Oral, resp. rate 15, SpO2 94 %.There is no height or weight on file to calculate BMI.  General Appearance: Casual  Eye Contact:  Good  Speech:  Clear and Coherent  Volume:  Normal  Mood:  Euthymic  Affect:  Congruent  Thought Process:  Goal Directed  Orientation:  Full (Time, Place, and Person)  Thought Content:  Logical  Suicidal Thoughts:  No  Homicidal Thoughts:  No  Memory:  Immediate;   Fair Recent;   Fair Remote;   Poor  Judgement:  Impaired  Insight:  Shallow  Psychomotor Activity:  Normal  Concentration:  Concentration: Good and Attention Span: Good  Recall:  [employer] of Knowledge:  Fair  Language:  Good  Akathisia:  No  Handed:  Right  AIMS (if indicated):   N/A  Assets:  Agricultural consultant Housing Social Support  ADL's:  Intact  Cognition:  Impaired,  Moderate  Sleep:   Good     Treatment Plan Summary: Plan History of dementia  SW to assist with transfer of Pt back to her ALF Pt is psychiatrically clear  Disposition: No evidence of imminent risk to self or others at present.   Patient does not meet criteria for psychiatric inpatient admission. Supportive therapy provided about ongoing stressors. Discussed crisis plan, support from social network, calling 911, coming to the Emergency Department, and calling Suicide Hotline.  Ethelene Hal, NP 06/28/2017 12:06 PM   Patient seen face-to-face for psychiatric evaluation, chart reviewed and case discussed with the physician extender and developed treatment plan. Reviewed the information documented and agree with  the treatment  plan.  Buford Dresser, DO 06/28/17 2:50 PM

## 2017-06-28 NOTE — ED Notes (Signed)
Pt up to BR, then to doorway stating "I thought it was giddyup time."  Pt oriented to current time.

## 2017-06-28 NOTE — BH Assessment (Signed)
[facility] Assessment Progress Note  Per Buford Dresser, DO, this pt does not require psychiatric hospitalization at this time.  Pt is to be discharged from [facility] to return to pt's residential facility.  Jeanette Caprice, LCSW agrees to facilitate this.  No further discharge instructions are required.  Pt's nurse has been notified.  Jalene Mullet, [place] Triage Specialist [phone]

## 2017-06-28 NOTE — Progress Notes (Addendum)
CSW received a handoff from the 1st shift [facility] ED CSW covering [facility] ED who stated [facility] will not take pt back.  CSW confirmed with pt's son Alyssa Macdonald that [facility] won't take pt back due to a possible suicide attempt, until pt has a 24 hour a day sitter for approx 4 days until a memory care bed is available.  CSW met with pt's son Alyssa Macdonald who stated Alyssa Macdonald 24 hour a day aide service will see the pt on the morning of 5/28 to assess the pt for 24 hour a day care.  Pt's son asked to take the pt home until pt can be assessed by Alyssa Macdonald at [facility] on 5/28.  CSW staffed pt's case with the CSW Asst Director, the NP and the RN and all are agreeable to this.  RN updated that the NP has D/C'd the pt and RN will D/C the pt into the pt's son's care.  CSW met with pt's son again who was appreciative and thanked the CSW.  Pt left with the pt at 4:22pm.  Please reconsult if future social work needs arise.  CSW signing off, as social work intervention is no longer needed.  Alyssa Guild. Josephine Rudnick, LCSW, LCAS, CSI Clinical Social Worker Ph: [phone]

## 2017-06-28 NOTE — Progress Notes (Addendum)
2:29pm- CSW attempted to follow up with Anderson Malta from [facility] and no answer at this time. CSW will handoff to evening [facility] ED CSW to aid in getting pt back to the facility.  CSW made aware that pt is ready to return back to [facility] [phone]. CSW spoke with Freddie Breech RN and was informed that she would have to speak with someone in management  about taking pt back. Anderson Malta expressed that's he would call CSW back once she hears back from management. At this time CSW remain involved for further needs to get pt back to facility.    Virgie Dad Khaleel Beckom, MSW, [facility] Clinical Social Worker [phone]

## 2017-08-16 NOTE — Progress Notes (Signed)
[facility]  PATIENT: Alyssa Macdonald DOB: Jan 27, 1946   REASON FOR VISIT: Follow-up for memory loss  HISTORY FROM: Patient and son   HISTORY OF PRESENT ILLNESS:UPDATE 7/16/2019CM [name], 72 year old female returns for follow-up with history of memory loss.  She had a UTI to the hospital with worsening of her memory during that time she went to rehab for couple of weeks then to assisted living but was kicked out of assisted living due to suicide attempt.  She is back at her home with a caregiver few hours in the morning and evening which does cooking and laundry medication administration , errands  etc. patient no longer drives.  She has had more agitation/hostility towards her daughter recently.  She was seen by her primary care last week and was placed on Belsomra and Namenda extended release 7 mg.  She is on Klonopin 0.5 daily which could be causing more confusion.  She returns for reevaluation Today, 02/15/2017: SAShe reports feeling stable, has been able to tolerate the Aricept generic 5 mg daily. Daughter has noticed no significant new issues, in fact, she feels that memory seems to be stable or slightly improved since September 2018. Her daughter does bring up the issue of driving longer distances as patient would like to be able to drive to [place] to see her son. She is doing fairly well with local roads and familiar routes within a smaller radius. Patient likes to eat out with friends at times but also needs an. She tries to hydrate well, she has 2 dogs and she walks every day. She has noticed a hand tremor which has become worse with time. Of note, daughter also has a hand tremor and patient's granddaughter who is 74 years old also has a mild hand tremor. I saw her on 10/22/2016, at which time we talked about her brain MRI results and her neuropsych test results.   She had neuropsychological evaluation with Dr. 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 Results of the current cognitive evaluation reveal impaired verbal and non-verbal memoryretrieval and consolidation as well as reducedverbal fluency. Additionally, there is evidence that her cognitive deficits are interfering with her ability to manage complex tasks, such as driving (forgetting familiar routes) and managing appointments. As such, diagnostic criteria for a dementia syndrome are met.   The patient's cognitive profile is suggestive of medial-temporal lobe involvement. Alzheimer's disease is the most likely etiology, given her cognitive profileandclinical features.The patient's stage of dementia can be characterized as mild. She retains many areas of strength on cognitive testing, including in processing speed, attention, visual-spatial construction and executive functioning.  There is no evidence of primary psychiatric disorder. Her current mood appears euthymic, and there is no behavioral disturbance.     REVIEW OF SYSTEMS: Full 14 system review of systems performed and notable only for those listed, all others are neg:  Constitutional: neg  Cardiovascular: neg Ear/Nose/Throat: neg  Skin: neg Eyes: neg Respiratory: neg Gastroitestinal: neg  Hematology/Lymphatic: neg  Endocrine: neg Musculoskeletal:neg Allergy/Immunology: neg Neurological: Essential tremor memory loss Psychiatric: neg Sleep : neg   ALLERGIES: Allergies  Allergen Reactions  . Zocor [Simvastatin]     Myalgia    HOME MEDICATIONS: Outpatient Medications Prior to Visit  Medication Sig Dispense Refill  . clonazePAM (KLONOPIN) 0.5 MG tablet Take 1 tablet (0.5 mg total) by mouth daily. 10 tablet 0  . donepezil (ARICEPT) 10 MG tablet Take 1  tablet (10 mg total) by mouth at bedtime. 90 tablet 3  . memantine (NAMENDA XR) 7 MG CP24 24 hr capsule Take 7 mg by mouth.    Marland Kitchen omeprazole (PRILOSEC) 10 MG capsule Take  10 mg by mouth daily.    . propranolol ER (INDERAL LA) 60 MG 24 hr capsule TAKE ONE CAPSULE BY MOUTH ONE TIME DAILY 90 capsule 4  . Suvorexant 10 MG TABS Take 10 mg by mouth.    . tolterodine (DETROL LA) 2 MG 24 hr capsule Take 2 mg by mouth daily.    . traZODone (DESYREL) 50 MG tablet Take 50 mg by mouth.     Facility-Administered Medications Prior to Visit  Medication Dose Route Frequency Provider Last Rate Last Dose  . incobotulinumtoxinA (XEOMIN) 100 units injection 100 Units  100 Units Intramuscular Q90 days Marcial Pacas, MD   100 Units at 12/11/15 1827    PAST MEDICAL HISTORY: Past Medical History:  Diagnosis Date  . Alzheimer's disease   . Cervical dystonia   . Complication of anesthesia    slow to awaken  . Depression   . Endometriosis    s/p total hysterectomy  . Esophageal reflux   . Essential and other specified forms of tremor   . GERD (gastroesophageal reflux disease)   . Hyperlipidemia   . Hypertension   . Hypopotassemia   . Osteoarthrosis involving, or with mention of more than one site, but not specified as generalized, site unspecified(715.80)   . Osteoarthrosis involving, or with mention of more than one site, but not specified as generalized, site unspecified(715.80)   . 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 Tremors of nervous system   . Trigger finger     PAST SURGICAL HISTORY: Past Surgical History:  Procedure Laterality Date  . APPENDECTOMY    . CATARACT EXTRACTION Bilateral    OD 07/03/2015, OS 05/29/2015  . CHOLECYSTECTOMY    . COLON SURGERY     bowel resection  . FOOT SURGERY    . JOINT REPLACEMENT     bilateral  . TONSILLECTOMY    . TOTAL ABDOMINAL HYSTERECTOMY    . TOTAL KNEE ARTHROPLASTY     Bilateral  . TRIGGER FINGER RELEASE  02/01/2012   Procedure: RELEASE TRIGGER FINGER/A-1 PULLEY;  Surgeon: Jolyn Nap, MD;  Location: [facility];  Service: Orthopedics;  Laterality: Right;  RIGHT LONG FINGER  TRIGGER  FINGER RELEASE  . WISDOM TOOTH EXTRACTION      FAMILY HISTORY: Family History  Problem Relation Age of Onset  . CVA Mother   . Alzheimer's disease Mother   . Diabetes Father   . Heart disease Father   . Parkinson's disease Paternal Uncle   . Cataracts Sister   . Breast cancer Neg Hx     SOCIAL HISTORY: Social History   Socioeconomic History  . Marital status: Widowed    Spouse name: Ronalee Belts  . Number of children: 2  . Years of education: 62  . Highest education level: Not on file  Occupational History  . Occupation: RETIRED   Social Needs  . Financial resource strain: Not on file  . Food insecurity:    Worry: Not on file    Inability: Not on file  . Transportation needs:    Medical: Not on file    Non-medical: Not on file  Tobacco Use  . Smoking status: Never Smoker  . Smokeless tobacco: Never Used  Substance and Sexual Activity  . Alcohol use:  Yes    Comment: Occasional,one glass of wine weekly  . Drug use: No  . Sexual activity: Not Currently    Partners: Male  Lifestyle  . Physical activity:    Days per week: Not on file    Minutes per session: Not on file  . Stress: Not on file  Relationships  . Social connections:    Talks on phone: Not on file    Gets together: Not on file    Attends religious service: Not on file    Active member of club or organization: Not on file    Attends meetings of clubs or organizations: Not on file    Relationship status: Not on file  . Intimate partner violence:    Fear of current or ex partner: Not on file    Emotionally abused: Not on file    Physically abused: Not on file    Forced sexual activity: Not on file  Other Topics Concern  . Not on file  Social History Narrative   Marital Status:Widowed Ronalee Belts)    Children:  Son Clare Gandy) Daughter [name])    Pets: Dog Terri Piedra)   Living Situation: Lives alone   Occupation: Retired     Education: Geophysicist/field seismologist in [place] Use/Exposure:  None    Alcohol Use:   Occasional 1-2 per week   Drug Use:  None   Diet:  Regular   Exercise:  None   Hobbies: Reading, Quilting   Patient is right handed                       PHYSICAL EXAM  Vitals:   08/17/17 1455  BP: (!) 99/57  Pulse: (!) 54  Weight: 180 lb 6.4 oz (81.8 kg)  Height: 5' 7"  (1.702 m)   Body mass index is 28.25 kg/m.  Generalized: Well developed, in no acute distress  Head: normocephalic and atraumatic,. Oropharynx benign  Neck: Supple,  Musculoskeletal: No deformity   Neurological examination   Mentation: Alert .AFT 16Clock drawing 4/4 MMSE - Mini Mental State Exam 08/17/2017  Orientation to time 3  Orientation to Place 5  Registration 3  Attention/ Calculation 2  Recall 3  Language- name 2 objects 2  Language- repeat 1  Language- follow 3 step command 3  Language- read & follow direction 1  Write a sentence 1  Copy design 1  Total score 25    Follows all commands speech and language fluent.   Cranial nerve II-XII: Pupils were equal round reactive to light extraocular movements were full, visual field were full on confrontational test. Facial sensation and strength were normal. hearing was intact to finger rubbing bilaterally. Uvula tongue midline. head turning and shoulder shrug were normal and symmetric.Tongue protrusion into cheek strength was normal. Motor: normal bulk and tone, full strength in the BUE, BLE, Sensory: normal and symmetric to light touch,  Coordination: finger-nose-finger, heel-to-shin bilaterally, no dysmetria, mild outstretched essential tremor Reflexes: Symmetric upper and lower , plantar responses were flexor bilaterally. Gait and Station: Rising up from seated position without assistance, normal stance,  moderate stride, good arm swing, smooth turning, able to perform tiptoe, and heel walking without difficulty. Tandem gait is unsteady  DIAGNOSTIC DATA (LABS, IMAGING, TESTING) - I reviewed patient records, labs, notes, testing and imaging  myself where available.  Lab Results  Component Value Date   WBC 5.6 06/27/2017   HGB 13.0 06/27/2017   HCT 39.7 06/27/2017   MCV  89.4 06/27/2017   PLT 362 06/27/2017      Component Value Date/Time   NA 141 06/27/2017 1445   K 3.8 06/27/2017 1445   CL 108 06/27/2017 1445   CO2 23 06/27/2017 1445   GLUCOSE 102 (H) 06/27/2017 1445   BUN 11 06/27/2017 1445   CREATININE 1.23 (H) 06/27/2017 1445   CREATININE 0.98 10/04/2012 0811   CALCIUM 9.3 06/27/2017 1445   PROT 6.8 06/27/2017 1445   ALBUMIN 4.3 06/27/2017 1445   AST 14 (L) 06/27/2017 1445   ALT 11 (L) 06/27/2017 1445   ALKPHOS 67 06/27/2017 1445   BILITOT 1.0 06/27/2017 1445   GFRNONAA 42 (L) 06/27/2017 1445   GFRNONAA 59 (L) 10/04/2012 0811   GFRAA 49 (L) 06/27/2017 1445   GFRAA 68 10/04/2012 0811   Lab Results  Component Value Date   CHOL 177 10/04/2012   HDL 49 10/04/2012   LDLCALC 99 10/04/2012   TRIG 144 10/04/2012   CHOLHDL 3.6 10/04/2012    Lab Results  Component Value Date   TSH 2.942 10/04/2012      ASSESSMENT AND PLAN SELIA WAREING a very pleasant 72 year old female with an underlying medical history of hypertension, hyperlipidemia, reflux disease, trigger finger, tremor, endometriosis, and osteoarthritis, status post bilateral knee replacement surgeries, status post cataract repairs, obesity, cervical dystonia(receivingbotulinum toxininjectionsq20mo,who presents forfollow-up consultation of her memory loss. Per family, they had noted memory issues for the past 5+ years. brain MRI in 2014 with fairly age-appropriate findings and mild microvascular changes, repeat brain MRIin 2/18showed mild atrophy and mild white matter changes, no significant progression compared to 2014. She recently had neuropsychological evaluationin 8/18with test results in keeping with mild dementia of the Alzheimer's type. She was started on Aricept generic low dose, 5 mg strength in September 2018 and is now taking the  10 mg dose.   Her neuropsychological test report also recommended driving  restriction.  Discussed with Dr. ARexene Alberts Continue Aricept 13mdaily Suggest decreasing Klonopin to 1/2 tab daily as it can cause increased confusion  Continue c/g assistance twice daily  No driving Follow up in 31m[facility]monthanDennie BibleNPOklahoma [facility] CenterC,[facility] [id number]uiFruitlandeBelcourtC [phone]

## 2017-08-17 ENCOUNTER — Ambulatory Visit (INDEPENDENT_AMBULATORY_CARE_PROVIDER_SITE_OTHER): Payer: Medicare Other | Admitting: Nurse Practitioner

## 2017-08-17 ENCOUNTER — Encounter: Payer: Self-pay | Admitting: Nurse Practitioner

## 2017-08-17 VITALS — BP 99/57 | HR 54 | Ht 67.0 in | Wt 180.4 lb

## 2017-08-17 DIAGNOSIS — F0280 Dementia in other diseases classified elsewhere without behavioral disturbance: Secondary | ICD-10-CM | POA: Diagnosis not present

## 2017-08-17 NOTE — Patient Instructions (Signed)
Continue Aricept 10mg  daily Suggest decreasing Klonopin to 1/2 tab daily as it can cause increased confusion  Continue c/g assistance twice daily  No driving Follow up in 4months

## 2018-03-01 NOTE — Progress Notes (Deleted)
[facility]  PATIENT: Alyssa Macdonald DOB: [birth year]   REASON FOR VISIT: Follow-up for memory loss  HISTORY FROM: Patient and son   HISTORY OF PRESENT ILLNESS:UPDATE 7/16/2019CM [name], 73 year old female returns for follow-up with history of memory loss.  She had a UTI to the hospital with worsening of her memory during that time she went to rehab for couple of weeks then to assisted living but was kicked out of assisted living due to suicide attempt.  She is back at her home with a caregiver few hours in the morning and evening which does cooking and laundry medication administration , errands  etc. patient no longer drives.  She has had more agitation/hostility towards her daughter recently.  She was seen by her primary care last week and was placed on Belsomra and Namenda extended release 7 mg.  She is on Klonopin 0.5 daily which could be causing more confusion.  She returns for reevaluation Today, 02/15/2017: SAShe reports feeling stable, has been able to tolerate the Aricept generic 5 mg daily. Daughter has noticed no significant new issues, in fact, she feels that memory seems to be stable or slightly improved since September 2018. Her daughter does bring up the issue of driving longer distances as patient would like to be able to drive to [place] to see her son. She is doing fairly well with local roads and familiar routes within a smaller radius. Patient likes to eat out with friends at times but also needs an. She tries to hydrate well, she has 2 dogs and she walks every day. She has noticed a hand tremor which has become worse with time. Of note, daughter also has a hand tremor and patient's granddaughter who is 51 years old also has a mild hand tremor. I saw her on 10/22/2016, at which time we talked about her brain MRI results and her neuropsych test results.   She had neuropsychological evaluation with Dr. 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 Results of the current cognitive evaluation reveal impaired verbal and non-verbal memoryretrieval and consolidation as well as reducedverbal fluency. Additionally, there is evidence that her cognitive deficits are interfering with her ability to manage complex tasks, such as driving (forgetting familiar routes) and managing appointments. As such, diagnostic criteria for a dementia syndrome are met.   The patient's cognitive profile is suggestive of medial-temporal lobe involvement. Alzheimer's disease is the most likely etiology, given her cognitive profileandclinical features.The patient's stage of dementia can be characterized as mild. She retains many areas of strength on cognitive testing, including in processing speed, attention, visual-spatial construction and executive functioning.  There is no evidence of primary psychiatric disorder. Her current mood appears euthymic, and there is no behavioral disturbance.     REVIEW OF SYSTEMS: Full 14 system review of systems performed and notable only for those listed, all others are neg:  Constitutional: neg  Cardiovascular: neg Ear/Nose/Throat: neg  Skin: neg Eyes: neg Respiratory: neg Gastroitestinal: neg  Hematology/Lymphatic: neg  Endocrine: neg Musculoskeletal:neg Allergy/Immunology: neg Neurological: Essential tremor memory loss Psychiatric: neg Sleep : neg   ALLERGIES: Allergies  Allergen Reactions  . Zocor [Simvastatin]     Myalgia    HOME MEDICATIONS: Outpatient Medications Prior to Visit  Medication Sig Dispense Refill  . clonazePAM (KLONOPIN) 0.5 MG tablet Take 1 tablet (0.5 mg total) by mouth daily. 10 tablet 0  . donepezil (ARICEPT) 10 MG tablet Take 1  tablet (10 mg total) by mouth at bedtime. 90 tablet 3  . memantine (NAMENDA XR) 7 MG CP24 24 hr capsule Take 7 mg by mouth.    Marland Kitchen omeprazole (PRILOSEC) 10 MG capsule Take  10 mg by mouth daily.    . propranolol ER (INDERAL LA) 60 MG 24 hr capsule TAKE ONE CAPSULE BY MOUTH ONE TIME DAILY 90 capsule 4  . Suvorexant 10 MG TABS Take 10 mg by mouth.    . tolterodine (DETROL LA) 2 MG 24 hr capsule Take 2 mg by mouth daily.    . traZODone (DESYREL) 50 MG tablet Take 50 mg by mouth.     Facility-Administered Medications Prior to Visit  Medication Dose Route Frequency Provider Last Rate Last Dose  . incobotulinumtoxinA (XEOMIN) 100 units injection 100 Units  100 Units Intramuscular Q90 days Marcial Pacas, MD   100 Units at 12/11/15 1827    PAST MEDICAL HISTORY: Past Medical History:  Diagnosis Date  . Alzheimer's disease   . Cervical dystonia   . Complication of anesthesia    slow to awaken  . Depression   . Endometriosis    s/p total hysterectomy  . Esophageal reflux   . Essential and other specified forms of tremor   . GERD (gastroesophageal reflux disease)   . Hyperlipidemia   . Hypertension   . Hypopotassemia   . Osteoarthrosis involving, or with mention of more than one site, but not specified as generalized, site unspecified(715.80)   . Osteoarthrosis involving, or with mention of more than one site, but not specified as generalized, site unspecified(715.80)   . 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 Tremors of nervous system   . Trigger finger     PAST SURGICAL HISTORY: Past Surgical History:  Procedure Laterality Date  . APPENDECTOMY    . CATARACT EXTRACTION Bilateral    OD 07/03/2015, OS 05/29/2015  . CHOLECYSTECTOMY    . COLON SURGERY     bowel resection  . FOOT SURGERY    . JOINT REPLACEMENT     bilateral  . TONSILLECTOMY    . TOTAL ABDOMINAL HYSTERECTOMY    . TOTAL KNEE ARTHROPLASTY     Bilateral  . TRIGGER FINGER RELEASE  02/01/2012   Procedure: RELEASE TRIGGER FINGER/A-1 PULLEY;  Surgeon: Jolyn Nap, MD;  Location: [facility];  Service: Orthopedics;  Laterality: Right;  RIGHT LONG FINGER  TRIGGER  FINGER RELEASE  . WISDOM TOOTH EXTRACTION      FAMILY HISTORY: Family History  Problem Relation Age of Onset  . CVA Mother   . Alzheimer's disease Mother   . Diabetes Father   . Heart disease Father   . Parkinson's disease Paternal Uncle   . Cataracts Sister   . Breast cancer Neg Hx     SOCIAL HISTORY: Social History   Socioeconomic History  . Marital status: Widowed    Spouse name: Ronalee Belts  . Number of children: 2  . Years of education: 49  . Highest education level: Not on file  Occupational History  . Occupation: RETIRED   Social Needs  . Financial resource strain: Not on file  . Food insecurity:    Worry: Not on file    Inability: Not on file  . Transportation needs:    Medical: Not on file    Non-medical: Not on file  Tobacco Use  . Smoking status: Never Smoker  . Smokeless tobacco: Never Used  Substance and Sexual Activity  . Alcohol use:  Yes    Comment: Occasional,one glass of wine weekly  . Drug use: No  . Sexual activity: Not Currently    Partners: Male  Lifestyle  . Physical activity:    Days per week: Not on file    Minutes per session: Not on file  . Stress: Not on file  Relationships  . Social connections:    Talks on phone: Not on file    Gets together: Not on file    Attends religious service: Not on file    Active member of club or organization: Not on file    Attends meetings of clubs or organizations: Not on file    Relationship status: Not on file  . Intimate partner violence:    Fear of current or ex partner: Not on file    Emotionally abused: Not on file    Physically abused: Not on file    Forced sexual activity: Not on file  Other Topics Concern  . 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 There is no height or weight on file to calculate BMI.  Generalized: Well developed, in no acute distress  Head: normocephalic and atraumatic,. Oropharynx benign  Neck: Supple,  Musculoskeletal: No deformity   Neurological examination   Mentation: Alert .AFT 16Clock drawing 4/4 MMSE - Mini Mental State Exam 08/17/2017  Orientation to time 3  Orientation to Place 5  Registration 3  Attention/ Calculation 2  Recall 3  Language- name 2 objects 2  Language- repeat 1  Language- follow 3 step command 3  Language- read & follow direction 1  Write a sentence 1  Copy design 1  Total score 25    Follows all commands speech and language fluent.   Cranial nerve II-XII: Pupils were equal round reactive to light extraocular movements were full, visual field were full on confrontational test. Facial sensation and strength were normal. hearing was intact to finger rubbing bilaterally. Uvula tongue midline. head turning and shoulder shrug were normal and symmetric.Tongue protrusion into cheek strength was normal. Motor: normal bulk and tone, full strength in the BUE, BLE, Sensory: normal and symmetric to light touch,  Coordination: finger-nose-finger, heel-to-shin bilaterally, no dysmetria, mild outstretched essential tremor Reflexes: Symmetric upper and lower , plantar responses were flexor bilaterally. Gait and Station: Rising up from seated position without assistance, normal stance,  moderate stride, good arm swing, smooth turning, able to perform tiptoe, and heel walking without difficulty. Tandem gait is unsteady  DIAGNOSTIC DATA (LABS, IMAGING, TESTING) - I reviewed patient records, labs, notes, testing and imaging myself where available.  Lab Results  Component  Value Date   WBC 5.6 06/27/2017   HGB 13.0 06/27/2017   HCT 39.7 06/27/2017   MCV 89.4 06/27/2017   PLT 362 06/27/2017      Component Value Date/Time  NA 141 06/27/2017 1445   K 3.8 06/27/2017 1445   CL 108 06/27/2017 1445   CO2 23 06/27/2017 1445   GLUCOSE 102 (H) 06/27/2017 1445   BUN 11 06/27/2017 1445   CREATININE 1.23 (H) 06/27/2017 1445   CREATININE 0.98 10/04/2012 0811   CALCIUM 9.3 06/27/2017 1445   PROT 6.8 06/27/2017 1445   ALBUMIN 4.3 06/27/2017 1445   AST 14 (L) 06/27/2017 1445   ALT 11 (L) 06/27/2017 1445   ALKPHOS 67 06/27/2017 1445   BILITOT 1.0 06/27/2017 1445   GFRNONAA 42 (L) 06/27/2017 1445   GFRNONAA 59 (L) 10/04/2012 0811   GFRAA 49 (L) 06/27/2017 1445   GFRAA 68 10/04/2012 0811   Lab Results  Component Value Date   CHOL 177 10/04/2012   HDL 49 10/04/2012   LDLCALC 99 10/04/2012   TRIG 144 10/04/2012   CHOLHDL 3.6 10/04/2012    Lab Results  Component Value Date   TSH 2.942 10/04/2012      ASSESSMENT AND PLAN MAGARET JUSTO a very pleasant 73 year old female with an underlying medical history of hypertension, hyperlipidemia, reflux disease, trigger finger, tremor, endometriosis, and osteoarthritis, status post bilateral knee replacement surgeries, status post cataract repairs, obesity, cervical dystonia(receivingbotulinum toxininjectionsq649mo,who presents forfollow-up consultation of her memory loss. Per family, they had noted memory issues for the past 5+ years. brain MRI in 2014 with fairly age-appropriate findings and mild microvascular changes, repeat brain MRIin 2/18showed mild atrophy and mild white matter changes, no significant progression compared to 2014. She recently had neuropsychological evaluationin 8/18with test results in keeping with mild dementia of the Alzheimer's type. She was started on Aricept generic low dose, 5 mg strength in September 2018 and is now taking the 10 mg dose.   Her neuropsychological test report  also recommended driving  restriction.  Discussed with Dr. ARexene Alberts Continue Aricept 148mdaily Suggest decreasing Klonopin to 1/2 tab daily as it can cause increased confusion  Continue c/g assistance twice daily  No driving [facility] up [facility]ro[facility] [address]mot[place] [phone]

## 2018-03-02 ENCOUNTER — Ambulatory Visit: Payer: Medicare Other | Admitting: Nurse Practitioner

## 2018-03-02 ENCOUNTER — Telehealth: Payer: Self-pay | Admitting: *Deleted

## 2018-03-02 NOTE — Telephone Encounter (Signed)
Patient was no show for follow up with NP today.  

## 2018-07-19 ENCOUNTER — Other Ambulatory Visit: Payer: Self-pay

## 2018-07-19 ENCOUNTER — Emergency Department (HOSPITAL_COMMUNITY)
Admission: EM | Admit: 2018-07-19 | Discharge: 2018-07-19 | Disposition: A | Discharge status: Home / Self Care | Payer: Medicare Other | Attending: Emergency Medicine | Admitting: Emergency Medicine

## 2018-07-19 ENCOUNTER — Encounter (HOSPITAL_COMMUNITY): Payer: Self-pay

## 2018-07-19 DIAGNOSIS — G309 Alzheimer's disease, unspecified: Secondary | ICD-10-CM | POA: Insufficient documentation

## 2018-07-19 DIAGNOSIS — N3001 Acute cystitis with hematuria: Secondary | ICD-10-CM | POA: Insufficient documentation

## 2018-07-19 DIAGNOSIS — Z96653 Presence of artificial knee joint, bilateral: Secondary | ICD-10-CM | POA: Diagnosis not present

## 2018-07-19 DIAGNOSIS — N939 Abnormal uterine and vaginal bleeding, unspecified: Secondary | ICD-10-CM | POA: Diagnosis present

## 2018-07-19 DIAGNOSIS — I10 Essential (primary) hypertension: Secondary | ICD-10-CM | POA: Insufficient documentation

## 2018-07-19 DIAGNOSIS — Z79899 Other long term (current) drug therapy: Secondary | ICD-10-CM | POA: Diagnosis not present

## 2018-07-19 DIAGNOSIS — F0280 Dementia in other diseases classified elsewhere without behavioral disturbance: Secondary | ICD-10-CM | POA: Diagnosis not present

## 2018-07-19 LAB — WET PREP, GENITAL
Clue Cells Wet Prep HPF POC: NONE SEEN
Sperm: NONE SEEN
Trich, Wet Prep: NONE SEEN
Yeast Wet Prep HPF POC: NONE SEEN

## 2018-07-19 LAB — URINALYSIS, ROUTINE W REFLEX MICROSCOPIC
Bilirubin Urine: NEGATIVE
Glucose, UA: NEGATIVE mg/dL
Ketones, ur: NEGATIVE mg/dL
Nitrite: NEGATIVE
Protein, ur: 30 mg/dL — AB
RBC / HPF: >50 RBC/hpf — ABNORMAL HIGH (ref 0–5)
Specific Gravity, Urine: 1.017 (ref 1.005–1.030)
WBC, UA: >50 WBC/hpf — ABNORMAL HIGH (ref 0–5)
pH: 5 (ref 5.0–8.0)

## 2018-07-19 LAB — CBC
HCT: 41.7 % (ref 36.0–46.0)
Hemoglobin: 13.4 g/dL (ref 12.0–15.0)
MCH: 29.6 pg (ref 26.0–34.0)
MCHC: 32.1 g/dL (ref 30.0–36.0)
MCV: 92.3 fL (ref 80.0–100.0)
Platelets: 266 10*3/uL (ref 150–400)
RBC: 4.52 MIL/uL (ref 3.87–5.11)
RDW: 13.1 % (ref 11.5–15.5)
WBC: 6 10*3/uL (ref 4.0–10.5)
nRBC: 0 % (ref 0.0–0.2)

## 2018-07-19 LAB — BASIC METABOLIC PANEL
Anion gap: 9 (ref 5–15)
BUN: 19 mg/dL (ref 8–23)
CO2: 23 mmol/L (ref 22–32)
Calcium: 9 mg/dL (ref 8.9–10.3)
Chloride: 104 mmol/L (ref 98–111)
Creatinine, Ser: 1.38 mg/dL — ABNORMAL HIGH (ref 0.44–1.00)
GFR calc Af Amer: 44 mL/min — ABNORMAL LOW (ref >60)
GFR calc non Af Amer: 38 mL/min — ABNORMAL LOW (ref >60)
Glucose, Bld: 105 mg/dL — ABNORMAL HIGH (ref 70–99)
Potassium: 3.8 mmol/L (ref 3.5–5.1)
Sodium: 136 mmol/L (ref 135–145)

## 2018-07-19 MED ORDER — CEPHALEXIN 500 MG PO CAPS
500.0000 mg | ORAL_CAPSULE | Freq: Once | ORAL | Status: AC
  Administered 2018-07-19: 22:00:00 500 mg via ORAL
  Filled 2018-07-19: qty 1

## 2018-07-19 MED ORDER — FLUCONAZOLE 200 MG PO TABS
200.0000 mg | ORAL_TABLET | Freq: Once | ORAL | Status: AC
  Administered 2018-07-19: 200 mg via ORAL
  Filled 2018-07-19: qty 1

## 2018-07-19 MED ORDER — CEPHALEXIN 500 MG PO CAPS: 500.0000 mg | ORAL_CAPSULE | Freq: Four times a day (QID) | ORAL | 0 refills | Status: DC

## 2018-07-19 NOTE — ED Provider Notes (Signed)
[facility] DEPT Provider Note   CSN: [id number] Arrival date & time: 07/19/18  1946    History   Chief Complaint Chief Complaint  Patient presents with  . Vaginal Bleeding    HPI Alyssa Macdonald is a 73 y.o. female.     73 yo F with a chief complaint of vaginal bleeding.  She describes this is bright and red.  Going on for about a week.  Thinks is less than her normal menstrual cycle whenever she would have one.  Denies any pelvic pain or cramping.  Denies foreign body denies trauma.  Denies abdominal pain.  Denies feeling lightheaded short of breath weak.  She is pretty confident that this is coming from the vagina.  Denies rectal bleeding.  The history is provided by the patient.  Vaginal Bleeding Quality:  Bright red Severity:  Mild Onset quality:  Gradual Duration:  7 days Timing:  Constant Progression:  Worsening Chronicity:  New Menstrual history:  Postmenopausal Possible pregnancy: no   Relieved by:  Nothing Worsened by:  Nothing Ineffective treatments:  None tried Associated symptoms: no dizziness, no dysuria, no fever and no nausea     Past Medical History:  Diagnosis Date  . Alzheimer's disease ([place])   . Cervical dystonia   . Complication of anesthesia    slow to awaken  . Depression   . Endometriosis    s/p total hysterectomy  . Esophageal reflux   . Essential and other specified forms of tremor   . GERD (gastroesophageal reflux disease)   . Hyperlipidemia   . Hypertension   . Hypopotassemia   . Osteoarthrosis involving, or with mention of more than one site, but not specified as generalized, site unspecified(715.80)   . Osteoarthrosis involving, or with mention of more than one site, but not specified as generalized, site unspecified(715.80)   . 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 Tremors of nervous system   . Trigger finger     Patient Active Problem List   Diagnosis Date Noted  . Encounter for  medical clearance for patient hold   . History of dementia   . Acute kidney injury ([place]) 06/10/2017  . Essential tremor 06/10/2017  . E. coli UTI (urinary tract infection) 06/05/2017  . Depression 06/05/2017  . UTI (urinary tract infection) 05/25/2017  . Acute metabolic encephalopathy 16/55/3748  . Essential hypertension 05/25/2017  . Anxiety 05/25/2017  . Dementia ([place]) 05/25/2017  . Hypokalemia 05/25/2017  . Dizziness 05/25/2017  . Encephalopathy 05/25/2017  . 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 Abnormal urine odor 06/18/2013  . Foul smelling urine 06/18/2013  . Vaginitis and vulvovaginitis, unspecified 06/18/2013  . Need for prophylactic vaccination and inoculation against influenza 12/11/2012  . Knee pain, chronic 12/11/2012  . GERD (gastroesophageal reflux disease) 12/11/2012  . Other and unspecified hyperlipidemia 12/11/2012  . Stress due to illness of family member 11/05/2012  . 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 Obesity (BMI 30-39.9) 10/11/2012  . Tremors of nervous system 09/10/2012  . Other malaise and fatigue 09/10/2012  . Leg cramps 09/10/2012  . Osteopenia 09/10/2012    Past Surgical History:  Procedure Laterality Date  . APPENDECTOMY    . CATARACT EXTRACTION Bilateral    OD 07/03/2015, OS 05/29/2015  . CHOLECYSTECTOMY    . COLON SURGERY     bowel resection  . FOOT SURGERY    . JOINT REPLACEMENT     bilateral  . TONSILLECTOMY    .  TOTAL ABDOMINAL HYSTERECTOMY    . TOTAL KNEE ARTHROPLASTY     Bilateral  . TRIGGER FINGER RELEASE  02/01/2012   Procedure: RELEASE TRIGGER FINGER/A-1 PULLEY;  Surgeon: Jolyn Nap, MD;  Location: [facility];  Service: Orthopedics;  Laterality: Right;  RIGHT LONG FINGER  TRIGGER FINGER RELEASE  . WISDOM TOOTH EXTRACTION       OB History   No obstetric history on file.      Home Medications    Prior to Admission medications   Medication Sig Start Date End Date Taking?  Authorizing Provider  cephALEXin (KEFLEX) 500 MG capsule Take 1 capsule (500 mg total) by mouth 4 (four) times daily. 07/19/18   [name], DO  clonazePAM (KLONOPIN) 0.5 MG tablet Take 1 tablet (0.5 mg total) by mouth daily. 05/28/17   Arrien, Jimmy Picket, MD  donepezil (ARICEPT) 10 MG tablet Take 1 tablet (10 mg total) by mouth at bedtime. 02/15/17   Star Age, MD  memantine (NAMENDA XR) 7 MG CP24 24 hr capsule Take 7 mg by mouth. 08/10/17   [provider]  omeprazole (PRILOSEC) 10 MG capsule Take 10 mg by mouth daily.    [provider]  propranolol ER (INDERAL LA) 60 MG 24 hr capsule TAKE ONE CAPSULE BY MOUTH ONE TIME DAILY 08/13/15   Star Age, MD  Suvorexant 10 MG TABS Take 10 mg by mouth. 08/10/17   [provider]  tolterodine (DETROL LA) 2 MG 24 hr capsule Take 2 mg by mouth daily.    [provider]  traZODone (DESYREL) 50 MG tablet Take 50 mg by mouth. 05/23/15   [provider]    Family History Family History  Problem Relation Age of Onset  . CVA Mother   . Alzheimer's disease Mother   . Diabetes Father   . Heart disease Father   . Parkinson's disease Paternal Uncle   . Cataracts Sister   . Breast cancer Neg Hx     Social History Social History   Tobacco Use  . Smoking status: Never Smoker  . Smokeless tobacco: Never Used  Substance Use Topics  . Alcohol use: Yes    Comment: Occasional,one glass of wine weekly  . Drug use: No     Allergies   Zocor [simvastatin]   Review of Systems Review of Systems  Constitutional: Negative for chills and fever.  HENT: Negative for congestion and rhinorrhea.   Eyes: Negative for redness and visual disturbance.  Respiratory: Negative for shortness of breath and wheezing.   Cardiovascular: Negative for chest pain and palpitations.  Gastrointestinal: Negative for nausea and vomiting.  Genitourinary: Positive for vaginal bleeding. Negative for dysuria and urgency.   Musculoskeletal: Negative for arthralgias and myalgias.  Skin: Negative for pallor and wound.  Neurological: Negative for dizziness and headaches.     Physical Exam Updated Vital Signs There were no vitals taken for this visit.  Physical Exam Vitals signs and nursing note reviewed.  Constitutional:      General: She is not in acute distress.    Appearance: She is well-developed. She is not diaphoretic.  HENT:     Head: Normocephalic and atraumatic.  Eyes:     Pupils: Pupils are equal, round, and reactive to light.  Neck:     Musculoskeletal: Normal range of motion and neck supple.  Cardiovascular:     Rate and Rhythm: Normal rate and regular rhythm.     Heart sounds: No murmur. No friction rub. No  gallop.   Pulmonary:     Effort: Pulmonary effort is normal.     Breath sounds: No wheezing or rales.  Abdominal:     General: There is no distension.     Palpations: Abdomen is soft.     Tenderness: There is no abdominal tenderness.  Genitourinary:    Comments: Brownish discharge.  No FB, no tears.  Musculoskeletal:        General: No tenderness.  Skin:    General: Skin is warm and dry.  Neurological:     Mental Status: She is alert and oriented to person, place, and time.  Psychiatric:        Behavior: Behavior normal.      ED Treatments / Results  Labs (all labs ordered are listed, but only abnormal results are displayed) Labs Reviewed  WET PREP, GENITAL - Abnormal; Notable for the following components:      Result Value   WBC, Wet Prep HPF POC MANY (*)    All other components within normal limits  BASIC METABOLIC PANEL - Abnormal; Notable for the following components:   Glucose, Bld 105 (*)    Creatinine, Ser 1.38 (*)    GFR calc non Af Amer 38 (*)    GFR calc Af Amer 44 (*)    All other components within normal limits  URINALYSIS, ROUTINE W REFLEX MICROSCOPIC - Abnormal; Notable for the following components:   APPearance CLOUDY (*)    Hgb urine dipstick  LARGE (*)    Protein, ur 30 (*)    Leukocytes,Ua LARGE (*)    RBC / HPF >50 (*)    WBC, UA >50 (*)    Bacteria, UA MANY (*)    All other components within normal limits  CBC    EKG None  Radiology No results found.  Procedures Procedures (including critical care time)  Medications Ordered in ED Medications  fluconazole (DIFLUCAN) tablet 200 mg (has no administration in time range)  cephALEXin (KEFLEX) capsule 500 mg (has no administration in time range)     Initial Impression / Assessment and Plan / ED Course  I have reviewed the triage vital signs and the nursing notes.  Pertinent labs & imaging results that were available during my care of the patient were reviewed by me and considered in my medical decision making (see chart for details).        73 yo F with a cc of vaginal bleeding.  Brownish discharge on my exam.    Hemoglobin is at her baseline and she is not anemic.  Her urine is concerning for infection.  There is also yeast noted to be in it.  I will treat her presumptively for urinary tract infection.  Encouraged her to follow-up with OB/GYN for possible vaginal bleeding in a postmenopausal female.  PCP follow-up as well.  9:06 PM:  I have discussed the diagnosis/risks/treatment options with the patient and believe the pt to be eligible for discharge home to follow-up with PCP. We also discussed returning to the ED immediately if new or worsening sx occur. We discussed the sx which are most concerning (e.g., sudden worsening pain, fever, inability to tolerate by mouth) that necessitate immediate return. Medications administered to the patient during their visit and any new prescriptions provided to the patient are listed below.  Medications given during this visit Medications  fluconazole (DIFLUCAN) tablet 200 mg (has no administration in time range)  cephALEXin (KEFLEX) capsule 500 mg (has no administration in time range)  The patient appears reasonably  screen and/or stabilized for discharge and I doubt any other medical condition or other [facility] requiring further screening, evaluation, or treatment in the ED at this time prior to discharge.    Final Clinical Impressions(s) / ED Diagnoses   Final diagnoses:  Vaginal bleeding  Acute cystitis with hematuria    ED Discharge Orders         Ordered    cephALEXin (KEFLEX) 500 MG capsule  4 times daily     07/19/18 2102           [name], DO 07/19/18 2107

## 2018-07-19 NOTE — ED Notes (Signed)
[employer] notified of pt returning to facility. Discharge instructions and medications were discussed.

## 2018-07-19 NOTE — ED Notes (Signed)
Pt ambulated to bathroom with assistance from staff.

## 2018-07-19 NOTE — ED Notes (Signed)
[facility] called for transport.  

## 2018-07-19 NOTE — Discharge Instructions (Signed)
Follow up with your OBGYN for evaluation.  Your urine appeared to be infected, and there was yeast found in the sample.  I am going to treat you with a one time antifungal dose here and antibiotics.

## 2018-07-19 NOTE — ED Notes (Signed)
Bed: WA17 Expected date:  Expected time:  Means of arrival:  Comments: EMS21 yo female dementia-vaginal pain 8/10 x 7 days-bleeding

## 2018-07-19 NOTE — ED Notes (Signed)
Pt dressed herself. Writer of this note helped pt to the bathroom. Standby assistance for safety.

## 2018-07-19 NOTE — ED Notes (Signed)
Assisted EDP with pelvic exam. Pt tolerated well.

## 2018-07-19 NOTE — ED Triage Notes (Signed)
Pt BIB [facility] from [facility] with c/o vaginal bleeding x1 week. Pt states a minimal amount of blood and it "feels like a cut." Pt denies sexual activity, and has early stages of dementia per EMS. VSS with EMS.

## 2018-09-23 ENCOUNTER — Emergency Department (HOSPITAL_COMMUNITY): Payer: Medicare Other

## 2018-09-23 ENCOUNTER — Emergency Department (HOSPITAL_COMMUNITY)
Admission: EM | Admit: 2018-09-23 | Discharge: 2018-09-24 | Disposition: A | Discharge status: Home / Self Care | Payer: Medicare Other | Attending: Emergency Medicine | Admitting: Emergency Medicine

## 2018-09-23 ENCOUNTER — Other Ambulatory Visit: Payer: Self-pay

## 2018-09-23 DIAGNOSIS — I10 Essential (primary) hypertension: Secondary | ICD-10-CM | POA: Insufficient documentation

## 2018-09-23 DIAGNOSIS — Z96653 Presence of artificial knee joint, bilateral: Secondary | ICD-10-CM | POA: Diagnosis not present

## 2018-09-23 DIAGNOSIS — R109 Unspecified abdominal pain: Secondary | ICD-10-CM | POA: Insufficient documentation

## 2018-09-23 DIAGNOSIS — F0280 Dementia in other diseases classified elsewhere without behavioral disturbance: Secondary | ICD-10-CM | POA: Diagnosis not present

## 2018-09-23 DIAGNOSIS — G309 Alzheimer's disease, unspecified: Secondary | ICD-10-CM | POA: Insufficient documentation

## 2018-09-23 DIAGNOSIS — Z79899 Other long term (current) drug therapy: Secondary | ICD-10-CM | POA: Insufficient documentation

## 2018-09-23 DIAGNOSIS — N939 Abnormal uterine and vaginal bleeding, unspecified: Secondary | ICD-10-CM | POA: Diagnosis present

## 2018-09-23 DIAGNOSIS — N898 Other specified noninflammatory disorders of vagina: Secondary | ICD-10-CM

## 2018-09-23 LAB — COMPREHENSIVE METABOLIC PANEL
ALT: 12 U/L (ref 0–44)
AST: 14 U/L — ABNORMAL LOW (ref 15–41)
Albumin: 4.4 g/dL (ref 3.5–5.0)
Alkaline Phosphatase: 79 U/L (ref 38–126)
Anion gap: 11 (ref 5–15)
BUN: 17 mg/dL (ref 8–23)
CO2: 25 mmol/L (ref 22–32)
Calcium: 9.2 mg/dL (ref 8.9–10.3)
Chloride: 103 mmol/L (ref 98–111)
Creatinine, Ser: 1.32 mg/dL — ABNORMAL HIGH (ref 0.44–1.00)
GFR calc Af Amer: 46 mL/min — ABNORMAL LOW (ref >60)
GFR calc non Af Amer: 39 mL/min — ABNORMAL LOW (ref >60)
Glucose, Bld: 90 mg/dL (ref 70–99)
Potassium: 4 mmol/L (ref 3.5–5.1)
Sodium: 139 mmol/L (ref 135–145)
Total Bilirubin: 1 mg/dL (ref 0.3–1.2)
Total Protein: 6.7 g/dL (ref 6.5–8.1)

## 2018-09-23 LAB — CBC WITH DIFFERENTIAL/PLATELET
Abs Immature Granulocytes: 0.03 10*3/uL (ref 0.00–0.07)
Basophils Absolute: 0.1 10*3/uL (ref 0.0–0.1)
Basophils Relative: 1 %
Eosinophils Absolute: 0.1 10*3/uL (ref 0.0–0.5)
Eosinophils Relative: 1 %
HCT: 44.9 % (ref 36.0–46.0)
Hemoglobin: 14.4 g/dL (ref 12.0–15.0)
Immature Granulocytes: 0 %
Lymphocytes Relative: 22 %
Lymphs Abs: 1.7 10*3/uL (ref 0.7–4.0)
MCH: 29.8 pg (ref 26.0–34.0)
MCHC: 32.1 g/dL (ref 30.0–36.0)
MCV: 93 fL (ref 80.0–100.0)
Monocytes Absolute: 0.5 10*3/uL (ref 0.1–1.0)
Monocytes Relative: 7 %
Neutro Abs: 5.3 10*3/uL (ref 1.7–7.7)
Neutrophils Relative %: 69 %
Platelets: 352 10*3/uL (ref 150–400)
RBC: 4.83 MIL/uL (ref 3.87–5.11)
RDW: 13 % (ref 11.5–15.5)
WBC: 7.7 10*3/uL (ref 4.0–10.5)
nRBC: 0 % (ref 0.0–0.2)

## 2018-09-23 LAB — LIPASE, BLOOD: Lipase: 37 U/L (ref 11–51)

## 2018-09-23 MED ORDER — IOHEXOL 300 MG/ML  SOLN
75.0000 mL | Freq: Once | INTRAMUSCULAR | Status: AC | PRN
  Administered 2018-09-23: 22:00:00 75 mL via INTRAVENOUS

## 2018-09-23 MED ORDER — SODIUM CHLORIDE 0.9 % IV SOLN
INTRAVENOUS | Status: DC
  Administered 2018-09-23: 21:00:00 via INTRAVENOUS

## 2018-09-23 MED ORDER — SODIUM CHLORIDE (PF) 0.9 % IJ SOLN
INTRAMUSCULAR | Status: AC
  Filled 2018-09-23: qty 50

## 2018-09-23 NOTE — ED Notes (Signed)
Pt c/o vaginal discomfort starting today.  Denies any pain, she states "Im not really sure why they sent me here."

## 2018-09-23 NOTE — Discharge Instructions (Addendum)
Work-up here tonight shows a vaginal cuff mass.  Patient's had a hysterectomy.  Also some concern for adnexal mass.  Discussed with Dr. your from GYN.  He wants patient to follow-up with Dr. Denman George call on Monday to set up an appointment.  In the meantime he is recommending tampon be placed and changed out daily.  Patient's blood work and vital signs all stable.

## 2018-09-23 NOTE — ED Triage Notes (Signed)
Pt BIBA from [facility].   Per EMS- Nursing staff reports pt c/o vaginal bleeding-showed them paper towels full of blood.  Nursing Staff found "knot in vaginal area" wanted her to come to ED to be assessed. Pt only c/o vaginal irritation to EMS.    BP 170 palpated HR 60 R- 16 SpO2-96% RA 97.9 Tympanic.

## 2018-09-23 NOTE — ED Notes (Signed)
Called facility spoke with nurse taking care of pt,  Gave doctor recommendations to utilize a tampon. Facility states they do not use them but will use a sanitary napkin.

## 2018-09-23 NOTE — ED Provider Notes (Signed)
[place] DEPT Provider Note   CSN: [id number] Arrival date & time: 09/23/18  1719     History   Chief Complaint Chief Complaint  Patient presents with  . Vaginal Bleeding    HPI KATELEIGH GALLIA is a 73 y.o. female.     Patient from memory care unit.  Patient has significant dementia.  Referred in for vaginal bleeding.  Patient without any particular complaints.  Patient's had a hysterectomy before.  Patient's had a total abdominal hysterectomy.     Past Medical History:  Diagnosis Date  . Alzheimer's disease ([place])   . Cervical dystonia   . Complication of anesthesia    slow to awaken  . Depression   . Endometriosis    s/p total hysterectomy  . Esophageal reflux   . Essential and other specified forms of tremor   . GERD (gastroesophageal reflux disease)   . Hyperlipidemia   . Hypertension   . Hypopotassemia   . Osteoarthrosis involving, or with mention of more than one site, but not specified as generalized, site unspecified(715.80)   . Osteoarthrosis involving, or with mention of more than one site, but not specified as generalized, site unspecified(715.80)   . 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 Tremors of nervous system   . Trigger finger     Patient Active Problem List   Diagnosis Date Noted  . Encounter for medical clearance for patient hold   . History of dementia   . Acute kidney injury ([place]) 06/10/2017  . Essential tremor 06/10/2017  . E. coli UTI (urinary tract infection) 06/05/2017  . Depression 06/05/2017  . UTI (urinary tract infection) 05/25/2017  . Acute metabolic encephalopathy [id number]  . Essential hypertension 05/25/2017  . Anxiety 05/25/2017  . Dementia ([place]) 05/25/2017  . Hypokalemia 05/25/2017  . Dizziness 05/25/2017  . Encephalopathy 05/25/2017  . 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 Abnormal urine odor 06/18/2013  . Foul smelling urine 06/18/2013  . Vaginitis and vulvovaginitis,  unspecified 06/18/2013  . Need for prophylactic vaccination and inoculation against influenza 12/11/2012  . Knee pain, chronic 12/11/2012  . GERD (gastroesophageal reflux disease) 12/11/2012  . Other and unspecified hyperlipidemia 12/11/2012  . Stress due to illness of family member 11/05/2012  . 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 Obesity (BMI 30-39.9) 10/11/2012  . Tremors of nervous system 09/10/2012  . Other malaise and fatigue 09/10/2012  . Leg cramps 09/10/2012  . Osteopenia 09/10/2012    Past Surgical History:  Procedure Laterality Date  . APPENDECTOMY    . CATARACT EXTRACTION Bilateral    OD 07/03/2015, OS 05/29/2015  . CHOLECYSTECTOMY    . COLON SURGERY     bowel resection  . FOOT SURGERY    . JOINT REPLACEMENT     bilateral  . TONSILLECTOMY    . TOTAL ABDOMINAL HYSTERECTOMY    . TOTAL KNEE ARTHROPLASTY     Bilateral  . TRIGGER FINGER RELEASE  02/01/2012   Procedure: RELEASE TRIGGER FINGER/A-1 PULLEY;  Surgeon: Jolyn Nap, MD;  Location: [facility];  Service: Orthopedics;  Laterality: Right;  RIGHT LONG FINGER  TRIGGER FINGER RELEASE  . WISDOM TOOTH EXTRACTION       OB History   No obstetric history on file.      Home Medications    Prior to Admission medications   Medication Sig Start Date End Date Taking? Authorizing Provider  atorvastatin (LIPITOR) 10 MG tablet Take 10 mg by mouth  every evening.   Yes [provider]  calcium-vitamin D (OSCAL WITH D) 500-200 MG-UNIT tablet Take 1 tablet by mouth daily.   Yes [provider]  clonazePAM (KLONOPIN) 0.5 MG tablet Take 1 tablet (0.5 mg total) by mouth daily. Patient taking differently: Take 0.5 mg by mouth 2 (two) times daily.  05/28/17  Yes Arrien, Jimmy Picket, MD  donepezil (ARICEPT) 10 MG tablet Take 1 tablet (10 mg total) by mouth at bedtime. 02/15/17  Yes Star Age, MD  escitalopram (LEXAPRO) 10 MG tablet Take 10 mg by mouth at  bedtime.   Yes [provider]  Melatonin 5 MG TABS Take 5 mg by mouth at bedtime.   Yes [provider]  meloxicam (MOBIC) 15 MG tablet Take 15 mg by mouth daily.   Yes [provider]  memantine (NAMENDA XR) 7 MG CP24 24 hr capsule Take 7 mg by mouth daily.  08/10/17  Yes [provider]  nystatin-triamcinolone (MYCOLOG II) cream Apply 1 application topically 3 (three) times daily.   Yes [provider]  omeprazole (PRILOSEC) 10 MG capsule Take 10 mg by mouth daily.   Yes [provider]  primidone (MYSOLINE) 50 MG tablet Take 50 mg by mouth daily.   Yes [provider]  propranolol ER (INDERAL LA) 60 MG 24 hr capsule TAKE ONE CAPSULE BY MOUTH ONE TIME DAILY Patient taking differently: Take 60 mg by mouth daily.  08/13/15  Yes Star Age, MD  tolterodine (DETROL LA) 2 MG 24 hr capsule Take 2 mg by mouth daily.   Yes [provider]  traZODone (DESYREL) 50 MG tablet Take 50 mg by mouth at bedtime.  05/23/15  Yes [provider]  cephALEXin (KEFLEX) 500 MG capsule Take 1 capsule (500 mg total) by mouth 4 (four) times daily. Patient not taking: Reported on 09/23/2018 07/19/18   [name], DO    Family History Family History  Problem Relation Age of Onset  . CVA Mother   . Alzheimer's disease Mother   . Diabetes Father   . Heart disease Father   . Parkinson's disease Paternal Uncle   . Cataracts Sister   . Breast cancer Neg Hx     Social History Social History   Tobacco Use  . Smoking status: Never Smoker  . Smokeless tobacco: Never Used  Substance Use Topics  . Alcohol use: Yes    Comment: Occasional,one glass of wine weekly  . Drug use: No     Allergies   Zocor [simvastatin]   Review of Systems Review of Systems  Unable to perform ROS: Dementia     Physical Exam Updated Vital Signs BP (!) 153/81 (BP Location: Right Arm)   Pulse (!) 59   Temp 98.3 F (36.8 C) (Oral)   Resp 17   SpO2  97%   Physical Exam Vitals signs and nursing note reviewed.  Constitutional:      General: She is not in acute distress.    Appearance: Normal appearance. She is well-developed.  HENT:     Head: Normocephalic and atraumatic.  Eyes:     Extraocular Movements: Extraocular movements intact.     Conjunctiva/sclera: Conjunctivae normal.     Pupils: Pupils are equal, round, and reactive to light.  Neck:     Musculoskeletal: Normal range of motion and neck supple.  Cardiovascular:     Rate and Rhythm: Normal rate and regular rhythm.     Heart sounds: No murmur.  Pulmonary:  Effort: Pulmonary effort is normal. No respiratory distress.     Breath sounds: Normal breath sounds.  Abdominal:     Palpations: Abdomen is soft.     Tenderness: There is no abdominal tenderness.     Comments: Abdomen soft nontender.  Genitourinary:    Comments: Vaginal bleeding with some clots.  No palpable mass. Skin:    General: Skin is warm and dry.  Neurological:     Mental Status: She is alert. Mental status is at baseline.      ED Treatments / Results  Labs (all labs ordered are listed, but only abnormal results are displayed) Labs Reviewed  COMPREHENSIVE METABOLIC PANEL - Abnormal; Notable for the following components:      Result Value   Creatinine, Ser 1.32 (*)    AST 14 (*)    GFR calc non Af Amer 39 (*)    GFR calc Af Amer 46 (*)    All other components within normal limits  LIPASE, BLOOD  CBC WITH DIFFERENTIAL/PLATELET  CBC WITH DIFFERENTIAL/PLATELET  URINALYSIS, ROUTINE W REFLEX MICROSCOPIC    EKG None  Radiology Ct Abdomen Pelvis W Contrast  Result Date: 09/23/2018 CLINICAL DATA:  73 year old with vaginal bleeding.  Abdominal pain. EXAM: CT ABDOMEN AND PELVIS WITH CONTRAST TECHNIQUE: Multidetector CT imaging of the abdomen and pelvis was performed using the standard protocol following bolus administration of intravenous contrast. CONTRAST:  20mL OMNIPAQUE IOHEXOL 300 MG/ML   SOLN COMPARISON:  None. FINDINGS: Lower chest: Linear atelectasis or scarring in both lower lobes. Trace right pleural thickening/minimal pleural fluid. Hepatobiliary: 16 mm cyst in the left lobe of the liver, 11 mm cyst in the left hepatic dome. No suspicious hepatic lesion. Possible vague 10 mm lesion in the anterior right hepatic dome, image 9 series 2, does not represent cyst. Clips in the gallbladder fossa postcholecystectomy. No biliary dilatation. Pancreas: No ductal dilatation or inflammation. Spleen: Normal in size without focal abnormality. Splenule at the hilum. Adrenals/Urinary Tract: Subcentimeter left adrenal nodularity. Right adrenal gland is normal. Severe right hydronephrosis with associated severe cortical thinning. There is also right ureteral dilatation to the pelvis at site of ill-defined pelvic mass. No perinephric edema. Absent excretion on delayed phase imaging. Parapelvic cyst in the left kidney without left hydronephrosis. Additional smaller cortical cysts in the left kidney. Urinary bladder is partially distended. Stomach/Bowel: Bowel evaluation is limited in the absence of enteric contrast. Tiny hiatal hernia. Stomach is decompressed. Proximal small bowel are unremarkable. Fluid-filled nondilated pelvic small bowel loops. Pelvic bowel loops in the right adnexa are tethered and contiguous with and cannot be delineated from ill-defined adnexal mass that is contiguous with the right aspect of the vaginal cuff. High-riding cecum in the right mid abdomen. Liquid stool in the proximal colon with formed stool distally. There is colonic tortuosity. Enteric sutures in the pelvis. Distal colonic diverticulosis without diverticulitis. Vascular/Lymphatic: Mild aortic atherosclerosis without aneurysm. Portal vein is patent. No enlarged abdominal or pelvic lymph nodes. Reproductive: Post hysterectomy. There is ill-defined soft tissue density contiguous with the vaginal cuff extending 4.5 cm cranially.  Right adnexal mass which is difficult to delineate from adjacent and contiguous bowel, series 2, image 61. Other: No significant free fluid.  No free air. Musculoskeletal: Hemangioma within T12 vertebral body. There are no acute or suspicious osseous abnormalities. IMPRESSION: 1. Post hysterectomy. Ill-defined right adnexal mass which is contiguous with the right aspect of the vaginal cuff, difficult to delineate size due to adjacent bowel loops. This measures at least  4.5 cm in cranial caudal dimension. Adjacent small bowel loops tethered. This lesion obstructs the right ureter with severe right hydroureteronephrosis and renal cortical thinning. 2. Multiple hepatic cysts. Additional vague 10 mm hypodense lesion in the right dome is too small to characterize. 3. Mild colonic diverticulosis without diverticulitis. 4.  Aortic Atherosclerosis (ICD10-I70.0). Electronically Signed   By: Keith Rake M.D.   On: 09/23/2018 22:04    Procedures Procedures (including critical care time)  Medications Ordered in ED Medications  0.9 %  sodium chloride infusion ( Intravenous Stopped 09/23/18 2253)  sodium chloride (PF) 0.9 % injection (has no administration in time range)  iohexol (OMNIPAQUE) 300 MG/ML solution 75 mL (75 mLs Intravenous Contrast Given 09/23/18 2136)     Initial Impression / Assessment and Plan / ED Course  I have reviewed the triage vital signs and the nursing notes.  Pertinent labs & imaging results that were available during my care of the patient were reviewed by me and considered in my medical decision making (see chart for details).        Patient with vaginal cuff mass.  Does not have a cervix because she had a hysterectomy.  Discussed the CT findings with Dr. Elonda Husky covering for OB/GYN at [facility].  He is recommending follow-up with Dr. Denman George and that a tampon be placed to tamponade this.  Patient hemodynamically stable hemoglobin stable.  But does have some vaginal bleeding  with clots.  This is most likely neoplastic.  Hence the reason for follow-up with Dr. Denman George.  Final Clinical Impressions(s) / ED Diagnoses   Final diagnoses:  Vaginal bleeding  Vaginal mass    ED Discharge Orders    None       Fredia Sorrow, MD 09/23/18 2315

## 2018-09-23 NOTE — ED Notes (Signed)
Called [facility]  Called facility

## 2018-09-23 NOTE — ED Notes (Signed)
Pt assisted to ambulate to bathroom, one person assistance.

## 2018-10-11 ENCOUNTER — Emergency Department (HOSPITAL_COMMUNITY)
Admission: EM | Admit: 2018-10-11 | Discharge: 2018-10-11 | Disposition: A | Discharge status: Home / Self Care | Payer: Medicare Other | Attending: Emergency Medicine | Admitting: Emergency Medicine

## 2018-10-11 ENCOUNTER — Other Ambulatory Visit: Payer: Self-pay

## 2018-10-11 DIAGNOSIS — G309 Alzheimer's disease, unspecified: Secondary | ICD-10-CM | POA: Insufficient documentation

## 2018-10-11 DIAGNOSIS — F0280 Dementia in other diseases classified elsewhere without behavioral disturbance: Secondary | ICD-10-CM | POA: Diagnosis not present

## 2018-10-11 DIAGNOSIS — I10 Essential (primary) hypertension: Secondary | ICD-10-CM | POA: Insufficient documentation

## 2018-10-11 DIAGNOSIS — Z791 Long term (current) use of non-steroidal anti-inflammatories (NSAID): Secondary | ICD-10-CM | POA: Diagnosis not present

## 2018-10-11 DIAGNOSIS — N823 Fistula of vagina to large intestine: Secondary | ICD-10-CM | POA: Insufficient documentation

## 2018-10-11 DIAGNOSIS — Z79899 Other long term (current) drug therapy: Secondary | ICD-10-CM | POA: Insufficient documentation

## 2018-10-11 DIAGNOSIS — N898 Other specified noninflammatory disorders of vagina: Secondary | ICD-10-CM | POA: Diagnosis not present

## 2018-10-11 DIAGNOSIS — B356 Tinea cruris: Secondary | ICD-10-CM | POA: Diagnosis not present

## 2018-10-11 MED ORDER — NYSTATIN 100000 UNIT/GM EX CREA: TOPICAL_CREAM | CUTANEOUS | 0 refills | Status: DC

## 2018-10-11 NOTE — ED Provider Notes (Signed)
[facility] DEPT Provider Note   CSN: [id number] Arrival date & time: 10/11/18  0108     History   Chief Complaint Chief Complaint  Patient presents with  . Vaginal Pain    vaginal mass    HPI Alyssa Macdonald is a 73 y.o. female.     The history is provided by the EMS personnel. The history is limited by the condition of the patient (level 5 caveat dementia).  Illness Location:  Vaginal Quality:  Leakage of stool Severity:  Mild Onset quality:  Gradual Timing:  Constant Progression:  Unchanged Chronicity:  New Context:  Vaginal mass has not yet seen gyn oncology Relieved by:  Nothing Worsened by:  Nothing Ineffective treatments:  None tried Associated symptoms: no congestion, no cough, no nausea and no wheezing   Risk factors:  Elderly with dementia Sent in for leakage of stool from vagina.  Has known vaginal mass for whhich she has not seen gyn oncology yet.    Past Medical History:  Diagnosis Date  . Alzheimer's disease ([place])   . Cervical dystonia   . Complication of anesthesia    slow to awaken  . Depression   . Endometriosis    s/p total hysterectomy  . Esophageal reflux   . Essential and other specified forms of tremor   . GERD (gastroesophageal reflux disease)   . Hyperlipidemia   . Hypertension   . Hypopotassemia   . Osteoarthrosis involving, or with mention of more than one site, but not specified as generalized, site unspecified(715.80)   . Osteoarthrosis involving, or with mention of more than one site, but not specified as generalized, site unspecified(715.80)   . 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 Tremors of nervous system   . Trigger finger     Patient Active Problem List   Diagnosis Date Noted  . Encounter for medical clearance for patient hold   . History of dementia   . Acute kidney injury ([place]) 06/10/2017  . Essential tremor 06/10/2017  . E. coli UTI (urinary tract infection) 06/05/2017  .  Depression 06/05/2017  . UTI (urinary tract infection) 05/25/2017  . Acute metabolic encephalopathy [id number]  . Essential hypertension 05/25/2017  . Anxiety 05/25/2017  . Dementia ([place]) 05/25/2017  . Hypokalemia 05/25/2017  . Dizziness 05/25/2017  . Encephalopathy 05/25/2017  . 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 Abnormal urine odor 06/18/2013  . Foul smelling urine 06/18/2013  . Vaginitis and vulvovaginitis, unspecified 06/18/2013  . Need for prophylactic vaccination and inoculation against influenza 12/11/2012  . Knee pain, chronic 12/11/2012  . GERD (gastroesophageal reflux disease) 12/11/2012  . Other and unspecified hyperlipidemia 12/11/2012  . Stress due to illness of family member 11/05/2012  . 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 Obesity (BMI 30-39.9) 10/11/2012  . Tremors of nervous system 09/10/2012  . Other malaise and fatigue 09/10/2012  . Leg cramps 09/10/2012  . Osteopenia 09/10/2012    Past Surgical History:  Procedure Laterality Date  . APPENDECTOMY    . CATARACT EXTRACTION Bilateral    OD 07/03/2015, OS 05/29/2015  . CHOLECYSTECTOMY    . COLON SURGERY     bowel resection  . FOOT SURGERY    . JOINT REPLACEMENT     bilateral  . TONSILLECTOMY    . TOTAL ABDOMINAL HYSTERECTOMY    . TOTAL KNEE ARTHROPLASTY     Bilateral  . TRIGGER FINGER RELEASE  02/01/2012   Procedure: RELEASE TRIGGER FINGER/A-1 PULLEY;  Surgeon: Jolyn Nap, MD;  Location: [facility];  Service: Orthopedics;  Laterality: Right;  RIGHT LONG FINGER  TRIGGER FINGER RELEASE  . WISDOM TOOTH EXTRACTION       OB History   No obstetric history on file.      Home Medications    Prior to Admission medications   Medication Sig Start Date End Date Taking? Authorizing Provider  atorvastatin (LIPITOR) 10 MG tablet Take 10 mg by mouth every evening.    [provider]  calcium-vitamin D (OSCAL WITH D) 500-200 MG-UNIT tablet Take 1 tablet by  mouth daily.    [provider]  cephALEXin (KEFLEX) 500 MG capsule Take 1 capsule (500 mg total) by mouth 4 (four) times daily. Patient not taking: Reported on 09/23/2018 07/19/18   [name], DO  clonazePAM (KLONOPIN) 0.5 MG tablet Take 1 tablet (0.5 mg total) by mouth daily. Patient taking differently: Take 0.5 mg by mouth 2 (two) times daily.  05/28/17   Arrien, Jimmy Picket, MD  donepezil (ARICEPT) 10 MG tablet Take 1 tablet (10 mg total) by mouth at bedtime. 02/15/17   Star Age, MD  escitalopram (LEXAPRO) 10 MG tablet Take 10 mg by mouth at bedtime.    [provider]  Melatonin 5 MG TABS Take 5 mg by mouth at bedtime.    [provider]  meloxicam (MOBIC) 15 MG tablet Take 15 mg by mouth daily.    [provider]  memantine (NAMENDA XR) 7 MG CP24 24 hr capsule Take 7 mg by mouth daily.  08/10/17   [provider]  nystatin cream (MYCOSTATIN) Apply to affected area 2 times daily 10/11/18   Chyanne Kohut, MD  nystatin-triamcinolone (MYCOLOG II) cream Apply 1 application topically 3 (three) times daily.    [provider]  omeprazole (PRILOSEC) 10 MG capsule Take 10 mg by mouth daily.    [provider]  primidone (MYSOLINE) 50 MG tablet Take 50 mg by mouth daily.    [provider]  propranolol ER (INDERAL LA) 60 MG 24 hr capsule TAKE ONE CAPSULE BY MOUTH ONE TIME DAILY Patient taking differently: Take 60 mg by mouth daily.  08/13/15   Star Age, MD  tolterodine (DETROL LA) 2 MG 24 hr capsule Take 2 mg by mouth daily.    [provider]  traZODone (DESYREL) 50 MG tablet Take 50 mg by mouth at bedtime.  05/23/15   [provider]    Family History Family History  Problem Relation Age of Onset  . CVA Mother   . Alzheimer's disease Mother   . Diabetes Father   . Heart disease Father   . Parkinson's disease Paternal Uncle   . Cataracts Sister   . Breast cancer Neg Hx     Social History Social  History   Tobacco Use  . Smoking status: Never Smoker  . Smokeless tobacco: Never Used  Substance Use Topics  . Alcohol use: Yes    Comment: Occasional,one glass of wine weekly  . Drug use: No     Allergies   Zocor [simvastatin]   Review of Systems Review of Systems  Unable to perform ROS: Dementia  HENT: Negative for congestion.   Respiratory: Negative for cough and wheezing.   Gastrointestinal: Negative for nausea.  Genitourinary: Negative for difficulty urinating, dysuria and pelvic pain.  All other systems reviewed and are negative.    Physical Exam Updated Vital Signs BP (!) 130/95 (BP Location: Left Arm)  Pulse (!) 55   Temp 97.8 F (36.6 C) (Oral)   Resp 18   SpO2 97%   Physical Exam Vitals signs and nursing note reviewed.  Constitutional:      General: She is not in acute distress.    Appearance: She is obese.  HENT:     Head: Normocephalic and atraumatic.     Nose: Nose normal.  Eyes:     Conjunctiva/sclera: Conjunctivae normal.     Pupils: Pupils are equal, round, and reactive to light.  Neck:     Musculoskeletal: Normal range of motion and neck supple.  Cardiovascular:     Rate and Rhythm: Normal rate and regular rhythm.     Pulses: Normal pulses.     Heart sounds: Normal heart sounds.  Pulmonary:     Effort: Pulmonary effort is normal.     Breath sounds: Normal breath sounds.  Abdominal:     General: Abdomen is flat. Bowel sounds are normal.     Palpations: There is no mass.     Tenderness: There is no abdominal tenderness. There is no guarding or rebound.     Hernia: No hernia is present.  Genitourinary:    Comments: Tinea of the groin, chaperon present scan stool in the vaginal vault, fistula present  Musculoskeletal: Normal range of motion.  Skin:    General: Skin is warm and dry.     Capillary Refill: Capillary refill takes less than 2 seconds.  Neurological:     General: No focal deficit present.     Mental Status: She is alert.   Psychiatric:        Mood and Affect: Mood normal.        Behavior: Behavior normal.      ED Treatments / Results   Radiology No results found.  Procedures Procedures (including critical care time)  Medications Ordered in ED Medications - No data to display   155 case d/w Dr. 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  Needs to follow up with GYN oncology.    Patient needs to be seen by Dr. Denman George, this will be placed on discharge instructions for nursing home so that the appointment is made as further management needs to be with gyn oncology.    Alyssa Macdonald was evaluated in Emergency Department on 10/11/2018 for the symptoms described in the history of present illness. She was evaluated in the context of the global COVID-19 pandemic, which necessitated consideration that the patient might be at risk for infection with the SARS-CoV-2 virus that causes COVID-19. Institutional protocols and algorithms that pertain to the evaluation of patients at risk for COVID-19 are in a state of rapid change based on information released by regulatory bodies including the CDC and federal and state organizations. These policies and algorithms were followed during the patient's care in the ED.\   Final Clinical Impressions(s) / ED Diagnoses   Final diagnoses:  Tinea cruris  Vaginal mass   Return for intractable cough, coughing up blood,fevers >100.4 unrelieved by medication, shortness of breath, intractable vomiting, chest pain, shortness of breath, weakness,numbness, changes in speech, facial asymmetry,abdominal pain, passing out,Inability to tolerate liquids or food, cough, altered mental status or any concerns. No signs of systemic illness or infection. The patient is nontoxic-appearing on exam and vital signs are within normal limits.   I have reviewed the triage vital signs and the nursing notes. Pertinent labs &imaging results that were available during my care of  the patient  were reviewed by me and considered in my medical decision making (see chart for details). After history, exam, and medical workup I feel the patient has beenappropriately medically screened and is safe for discharge home. Pertinent diagnoses were discussed with the patient. Patient was givenreturn precautions ED Discharge Orders         Ordered    nystatin cream (MYCOSTATIN)     10/11/18 0123           Mandalyn Pasqua, MD 10/11/18 0200

## 2018-10-11 NOTE — Discharge Instructions (Addendum)
Patient needs to be seen by Dr. Denman George for management of vaginal mass and rectovaginal fistula.  Please make this appointment

## 2018-10-11 NOTE — ED Triage Notes (Signed)
[facility] transported pt from [facility] to [facility] ED and reports the following:  Pt has vaginal mass with pain, this is on going issue, no changes in mass. Pt has an appointment soon to address this issue.  [facility] 3rd shift came in and said, "pt has feces leaking from the vaginal  mass." This is the reason they call EMS.  Hx dementia. No change in dementia.  Pt take metoprolol at night.

## 2018-10-11 NOTE — ED Notes (Signed)
Called [facility] for transport back to [facility]

## 2018-10-12 ENCOUNTER — Telehealth: Payer: Self-pay

## 2018-10-12 NOTE — Telephone Encounter (Signed)
LM for Ms Nils Pyle that Ms Kolm does not need to been seen sooner then appointment 10-19-18 after review of ED visit 10-11-18 per Joylene John, NP.

## 2018-10-16 ENCOUNTER — Other Ambulatory Visit: Payer: Self-pay

## 2018-10-16 ENCOUNTER — Inpatient Hospital Stay (HOSPITAL_COMMUNITY)
Admission: EM | Admit: 2018-10-16 | Discharge: 2018-10-28 | DRG: 988 | Disposition: A | Discharge status: Skilled Nursing Facility | Payer: Medicare Other | Attending: Internal Medicine | Admitting: Internal Medicine

## 2018-10-16 DIAGNOSIS — R001 Bradycardia, unspecified: Secondary | ICD-10-CM | POA: Diagnosis not present

## 2018-10-16 DIAGNOSIS — G243 Spasmodic torticollis: Secondary | ICD-10-CM | POA: Diagnosis not present

## 2018-10-16 DIAGNOSIS — N390 Urinary tract infection, site not specified: Secondary | ICD-10-CM | POA: Diagnosis not present

## 2018-10-16 DIAGNOSIS — D649 Anemia, unspecified: Secondary | ICD-10-CM

## 2018-10-16 DIAGNOSIS — N824 Other female intestinal-genital tract fistulae: Secondary | ICD-10-CM

## 2018-10-16 DIAGNOSIS — F0280 Dementia in other diseases classified elsewhere without behavioral disturbance: Secondary | ICD-10-CM | POA: Diagnosis present

## 2018-10-16 DIAGNOSIS — N136 Pyonephrosis: Secondary | ICD-10-CM | POA: Diagnosis present

## 2018-10-16 DIAGNOSIS — N993 Prolapse of vaginal vault after hysterectomy: Secondary | ICD-10-CM | POA: Diagnosis present

## 2018-10-16 DIAGNOSIS — Z66 Do not resuscitate: Secondary | ICD-10-CM | POA: Diagnosis present

## 2018-10-16 DIAGNOSIS — D509 Iron deficiency anemia, unspecified: Secondary | ICD-10-CM | POA: Diagnosis present

## 2018-10-16 DIAGNOSIS — D62 Acute posthemorrhagic anemia: Secondary | ICD-10-CM | POA: Diagnosis present

## 2018-10-16 DIAGNOSIS — Z8249 Family history of ischemic heart disease and other diseases of the circulatory system: Secondary | ICD-10-CM

## 2018-10-16 DIAGNOSIS — G309 Alzheimer's disease, unspecified: Secondary | ICD-10-CM | POA: Diagnosis present

## 2018-10-16 DIAGNOSIS — R269 Unspecified abnormalities of gait and mobility: Secondary | ICD-10-CM | POA: Diagnosis present

## 2018-10-16 DIAGNOSIS — I959 Hypotension, unspecified: Secondary | ICD-10-CM | POA: Diagnosis not present

## 2018-10-16 DIAGNOSIS — N898 Other specified noninflammatory disorders of vagina: Secondary | ICD-10-CM | POA: Diagnosis present

## 2018-10-16 DIAGNOSIS — R319 Hematuria, unspecified: Secondary | ICD-10-CM

## 2018-10-16 DIAGNOSIS — N183 Chronic kidney disease, stage 3 (moderate): Secondary | ICD-10-CM | POA: Diagnosis present

## 2018-10-16 DIAGNOSIS — Z79891 Long term (current) use of opiate analgesic: Secondary | ICD-10-CM

## 2018-10-16 DIAGNOSIS — N823 Fistula of vagina to large intestine: Secondary | ICD-10-CM | POA: Diagnosis not present

## 2018-10-16 DIAGNOSIS — E785 Hyperlipidemia, unspecified: Secondary | ICD-10-CM | POA: Diagnosis present

## 2018-10-16 DIAGNOSIS — Z23 Encounter for immunization: Secondary | ICD-10-CM

## 2018-10-16 DIAGNOSIS — R1900 Intra-abdominal and pelvic swelling, mass and lump, unspecified site: Secondary | ICD-10-CM | POA: Diagnosis present

## 2018-10-16 DIAGNOSIS — R9389 Abnormal findings on diagnostic imaging of other specified body structures: Secondary | ICD-10-CM

## 2018-10-16 DIAGNOSIS — Z20828 Contact with and (suspected) exposure to other viral communicable diseases: Secondary | ICD-10-CM | POA: Diagnosis present

## 2018-10-16 DIAGNOSIS — F418 Other specified anxiety disorders: Secondary | ICD-10-CM | POA: Diagnosis present

## 2018-10-16 DIAGNOSIS — C189 Malignant neoplasm of colon, unspecified: Secondary | ICD-10-CM | POA: Diagnosis present

## 2018-10-16 DIAGNOSIS — Z9049 Acquired absence of other specified parts of digestive tract: Secondary | ICD-10-CM

## 2018-10-16 DIAGNOSIS — Z820 Family history of epilepsy and other diseases of the nervous system: Secondary | ICD-10-CM

## 2018-10-16 DIAGNOSIS — K5730 Diverticulosis of large intestine without perforation or abscess without bleeding: Secondary | ICD-10-CM | POA: Diagnosis present

## 2018-10-16 DIAGNOSIS — I129 Hypertensive chronic kidney disease with stage 1 through stage 4 chronic kidney disease, or unspecified chronic kidney disease: Secondary | ICD-10-CM | POA: Diagnosis present

## 2018-10-16 DIAGNOSIS — N281 Cyst of kidney, acquired: Secondary | ICD-10-CM | POA: Diagnosis present

## 2018-10-16 DIAGNOSIS — Z8659 Personal history of other mental and behavioral disorders: Secondary | ICD-10-CM

## 2018-10-16 DIAGNOSIS — Z79899 Other long term (current) drug therapy: Secondary | ICD-10-CM

## 2018-10-16 DIAGNOSIS — Z888 Allergy status to other drugs, medicaments and biological substances status: Secondary | ICD-10-CM

## 2018-10-16 DIAGNOSIS — Z823 Family history of stroke: Secondary | ICD-10-CM

## 2018-10-16 DIAGNOSIS — N179 Acute kidney failure, unspecified: Secondary | ICD-10-CM | POA: Diagnosis present

## 2018-10-16 DIAGNOSIS — N2882 Megaloureter: Secondary | ICD-10-CM | POA: Diagnosis present

## 2018-10-16 DIAGNOSIS — E876 Hypokalemia: Secondary | ICD-10-CM | POA: Diagnosis present

## 2018-10-16 DIAGNOSIS — Z96653 Presence of artificial knee joint, bilateral: Secondary | ICD-10-CM | POA: Diagnosis present

## 2018-10-16 DIAGNOSIS — K219 Gastro-esophageal reflux disease without esophagitis: Secondary | ICD-10-CM | POA: Diagnosis present

## 2018-10-16 LAB — BASIC METABOLIC PANEL
Anion gap: 11 (ref 5–15)
BUN: 15 mg/dL (ref 8–23)
CO2: 23 mmol/L (ref 22–32)
Calcium: 8.8 mg/dL — ABNORMAL LOW (ref 8.9–10.3)
Chloride: 105 mmol/L (ref 98–111)
Creatinine, Ser: 1.56 mg/dL — ABNORMAL HIGH (ref 0.44–1.00)
GFR calc Af Amer: 37 mL/min — ABNORMAL LOW (ref >60)
GFR calc non Af Amer: 32 mL/min — ABNORMAL LOW (ref >60)
Glucose, Bld: 95 mg/dL (ref 70–99)
Potassium: 3.4 mmol/L — ABNORMAL LOW (ref 3.5–5.1)
Sodium: 139 mmol/L (ref 135–145)

## 2018-10-16 LAB — URINALYSIS, ROUTINE W REFLEX MICROSCOPIC
Bilirubin Urine: NEGATIVE
Glucose, UA: NEGATIVE mg/dL
Ketones, ur: 5 mg/dL — AB
Nitrite: NEGATIVE
Protein, ur: 100 mg/dL — AB
RBC / HPF: >50 RBC/hpf — ABNORMAL HIGH (ref 0–5)
Specific Gravity, Urine: 1.023 (ref 1.005–1.030)
WBC, UA: >50 WBC/hpf — ABNORMAL HIGH (ref 0–5)
pH: 5 (ref 5.0–8.0)

## 2018-10-16 LAB — CREATININE, SERUM
Creatinine, Ser: 1.4 mg/dL — ABNORMAL HIGH (ref 0.44–1.00)
GFR calc Af Amer: 42 mL/min — ABNORMAL LOW (ref >60)
GFR calc non Af Amer: 37 mL/min — ABNORMAL LOW (ref >60)

## 2018-10-16 LAB — CBC
HCT: 36 % (ref 36.0–46.0)
HCT: 38.2 % (ref 36.0–46.0)
Hemoglobin: 11.5 g/dL — ABNORMAL LOW (ref 12.0–15.0)
Hemoglobin: 12.5 g/dL (ref 12.0–15.0)
MCH: 29.9 pg (ref 26.0–34.0)
MCH: 30.4 pg (ref 26.0–34.0)
MCHC: 31.9 g/dL (ref 30.0–36.0)
MCHC: 32.7 g/dL (ref 30.0–36.0)
MCV: 93.8 fL (ref 80.0–100.0)
MCV: 92.9 fL (ref 80.0–100.0)
Platelets: 352 10*3/uL (ref 150–400)
Platelets: 353 10*3/uL (ref 150–400)
RBC: 3.84 MIL/uL — ABNORMAL LOW (ref 3.87–5.11)
RBC: 4.11 MIL/uL (ref 3.87–5.11)
RDW: 12.9 % (ref 11.5–15.5)
RDW: 12.7 % (ref 11.5–15.5)
WBC: 7.7 10*3/uL (ref 4.0–10.5)
WBC: 9.8 10*3/uL (ref 4.0–10.5)
nRBC: 0 % (ref 0.0–0.2)
nRBC: 0 % (ref 0.0–0.2)

## 2018-10-16 LAB — SARS CORONAVIRUS 2 BY RT PCR (HOSPITAL ORDER, PERFORMED IN ~~LOC~~ HOSPITAL LAB): SARS Coronavirus 2: NEGATIVE

## 2018-10-16 MED ORDER — SODIUM CHLORIDE 0.9 % IV SOLN
INTRAVENOUS | Status: DC
  Administered 2018-10-16 – 2018-10-26 (×9): via INTRAVENOUS

## 2018-10-16 MED ORDER — SODIUM CHLORIDE 0.9 % IV SOLN
1.0000 g | Freq: Once | INTRAVENOUS | Status: AC
  Administered 2018-10-16: 21:00:00 1 g via INTRAVENOUS
  Filled 2018-10-16: qty 10

## 2018-10-16 MED ORDER — SODIUM CHLORIDE 0.9 % IV SOLN
1.0000 g | INTRAVENOUS | Status: DC
  Administered 2018-10-17 – 2018-10-19 (×3): 1 g via INTRAVENOUS
  Filled 2018-10-16 (×2): qty 1
  Filled 2018-10-16: qty 10
  Filled 2018-10-16: qty 1

## 2018-10-16 MED ORDER — ENOXAPARIN SODIUM 40 MG/0.4ML ~~LOC~~ SOLN
40.0000 mg | Freq: Every day | SUBCUTANEOUS | Status: DC
  Administered 2018-10-16 – 2018-10-18 (×3): 40 mg via SUBCUTANEOUS
  Filled 2018-10-16 (×3): qty 0.4

## 2018-10-16 MED ORDER — NYSTATIN 100000 UNIT/GM EX OINT
TOPICAL_OINTMENT | Freq: Three times a day (TID) | CUTANEOUS | Status: DC
  Administered 2018-10-16: 23:00:00 via TOPICAL
  Filled 2018-10-16: qty 15

## 2018-10-16 NOTE — ED Notes (Addendum)
Son is Alura Santone and contact number is [phone].   Pt stated multiple times tonight to this nurse that she wants to be made a  DNR and does not want any extreme measures to be taken for her or anything that would leave her "brain dead" if "worst came to worst".

## 2018-10-16 NOTE — ED Provider Notes (Signed)
[facility] DEPT Provider Note   CSN: [id number] Arrival date & time: 10/16/18  1445     History   Chief Complaint Chief Complaint  Patient presents with  . Vaginal Prolapse    HPI Alyssa Macdonald is a 73 y.o. female.     HPI patient presents from nursing home.  History of dementia.  Has known pelvic mass and known colo-vesicular fistula.  Has skin changes.  Reportedly sent in for burning in the area.  Has follow-up with GYN in 3 days.  It appears that the nursing home is been trying to get the appointment moved up but GYN will not move forward.  Patient states she hurts but really cannot provide much other history. Level 5 caveat due to dementia. Past Medical History:  Diagnosis Date  . Alzheimer's disease ([place])   . Cervical dystonia   . Complication of anesthesia    slow to awaken  . Depression   . Endometriosis    s/p total hysterectomy  . Esophageal reflux   . Essential and other specified forms of tremor   . GERD (gastroesophageal reflux disease)   . Hyperlipidemia   . Hypertension   . Hypopotassemia   . Osteoarthrosis involving, or with mention of more than one site, but not specified as generalized, site unspecified(715.80)   . Osteoarthrosis involving, or with mention of more than one site, but not specified as generalized, site unspecified(715.80)   . 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 Tremors of nervous system   . Trigger finger     Patient Active Problem List   Diagnosis Date Noted  . Encounter for medical clearance for patient hold   . History of dementia   . Acute kidney injury ([place]) 06/10/2017  . Essential tremor 06/10/2017  . E. coli UTI (urinary tract infection) 06/05/2017  . Depression 06/05/2017  . UTI (urinary tract infection) 05/25/2017  . Acute metabolic encephalopathy [id number]  . Essential hypertension 05/25/2017  . Anxiety 05/25/2017  . Dementia ([place]) 05/25/2017  . Hypokalemia 05/25/2017  .  Dizziness 05/25/2017  . Encephalopathy 05/25/2017  . 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 Abnormal urine odor 06/18/2013  . Foul smelling urine 06/18/2013  . Vaginitis and vulvovaginitis, unspecified 06/18/2013  . Need for prophylactic vaccination and inoculation against influenza 12/11/2012  . Knee pain, chronic 12/11/2012  . GERD (gastroesophageal reflux disease) 12/11/2012  . Other and unspecified hyperlipidemia 12/11/2012  . Stress due to illness of family member 11/05/2012  . 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 Obesity (BMI 30-39.9) 10/11/2012  . Tremors of nervous system 09/10/2012  . Other malaise and fatigue 09/10/2012  . Leg cramps 09/10/2012  . Osteopenia 09/10/2012    Past Surgical History:  Procedure Laterality Date  . APPENDECTOMY    . CATARACT EXTRACTION Bilateral    OD 07/03/2015, OS 05/29/2015  . CHOLECYSTECTOMY    . COLON SURGERY     bowel resection  . FOOT SURGERY    . JOINT REPLACEMENT     bilateral  . TONSILLECTOMY    . TOTAL ABDOMINAL HYSTERECTOMY    . TOTAL KNEE ARTHROPLASTY     Bilateral  . TRIGGER FINGER RELEASE  02/01/2012   Procedure: RELEASE TRIGGER FINGER/A-1 PULLEY;  Surgeon: Jolyn Nap, MD;  Location: [facility];  Service: Orthopedics;  Laterality: Right;  RIGHT LONG FINGER  TRIGGER FINGER RELEASE  . WISDOM TOOTH EXTRACTION       OB History  No obstetric history on file.      Home Medications    Prior to Admission medications   Medication Sig Start Date End Date Taking? Authorizing Provider  acetaminophen (TYLENOL) 325 MG tablet Take 650 mg by mouth every 4 (four) hours as needed for mild pain or fever.   Yes [provider]  atorvastatin (LIPITOR) 10 MG tablet Take 10 mg by mouth every evening.   Yes [provider]  clonazePAM (KLONOPIN) 0.5 MG tablet Take 1 tablet (0.5 mg total) by mouth daily. Patient taking differently: Take 0.5 mg by mouth 2 (two) times daily.   05/28/17  Yes Arrien, Jimmy Picket, MD  donepezil (ARICEPT) 10 MG tablet Take 1 tablet (10 mg total) by mouth at bedtime. 02/15/17  Yes Star Age, MD  escitalopram (LEXAPRO) 10 MG tablet Take 10 mg by mouth at bedtime.   Yes [provider]  Melatonin 5 MG TABS Take 5 mg by mouth at bedtime.   Yes [provider]  meloxicam (MOBIC) 15 MG tablet Take 15 mg by mouth daily.   Yes [provider]  memantine (NAMENDA XR) 7 MG CP24 24 hr capsule Take 7 mg by mouth daily.  08/10/17  Yes [provider]  nystatin-triamcinolone (MYCOLOG II) cream Apply 1 application topically 3 (three) times daily.   Yes [provider]  omeprazole (PRILOSEC) 10 MG capsule Take 10 mg by mouth daily.   Yes [provider]  primidone (MYSOLINE) 50 MG tablet Take 50 mg by mouth daily.   Yes [provider]  propranolol ER (INDERAL LA) 60 MG 24 hr capsule TAKE ONE CAPSULE BY MOUTH ONE TIME DAILY Patient taking differently: Take 60 mg by mouth daily.  08/13/15  Yes Star Age, MD  tolterodine (DETROL LA) 2 MG 24 hr capsule Take 2 mg by mouth daily.   Yes [provider]  traZODone (DESYREL) 50 MG tablet Take 50 mg by mouth at bedtime.  05/23/15  Yes [provider]  cephALEXin (KEFLEX) 500 MG capsule Take 1 capsule (500 mg total) by mouth 4 (four) times daily. Patient not taking: Reported on 09/23/2018 07/19/18   [name], DO  nystatin cream (MYCOSTATIN) Apply to affected area 2 times daily Patient not taking: Reported on 10/16/2018 10/11/18   Randal Buba, April, MD    Family History Family History  Problem Relation Age of Onset  . CVA Mother   . Alzheimer's disease Mother   . Diabetes Father   . Heart disease Father   . Parkinson's disease Paternal Uncle   . Cataracts Sister   . Breast cancer Neg Hx     Social History Social History   Tobacco Use  . Smoking status: Never Smoker  . Smokeless tobacco: Never Used  Substance Use Topics   . Alcohol use: Yes    Comment: Occasional,one glass of wine weekly  . Drug use: No     Allergies   Zocor [simvastatin]   Review of Systems Review of Systems  Unable to perform ROS: Dementia     Physical Exam Updated Vital Signs BP 115/69 (BP Location: Left Arm)   Pulse 91   Temp 99 F (37.2 C) (Oral)   Resp 20   Ht 5\' 7"  (1.702 m)   Wt 86.2 kg   SpO2 97%   BMI 29.76 kg/m   Physical Exam Vitals signs and nursing note reviewed.  HENT:     Head: Normocephalic.  Cardiovascular:     Rate and Rhythm: Regular  rhythm.  Pulmonary:     Breath sounds: No wheezing or rhonchi.  Abdominal:     Tenderness: There is no abdominal tenderness.  Genitourinary:    Comments: Patient has stool in vagina.  Mild tenderness.  No abdominal tenderness Skin:    General: Skin is warm.     Capillary Refill: Capillary refill takes less than 2 seconds.  Neurological:     Mental Status: She is alert. Mental status is at baseline.      ED Treatments / Results  Labs (all labs ordered are listed, but only abnormal results are displayed) Labs Reviewed  BASIC METABOLIC PANEL - Abnormal; Notable for the following components:      Result Value   Potassium 3.4 (*)    Creatinine, Ser 1.56 (*)    Calcium 8.8 (*)    GFR calc non Af Amer 32 (*)    GFR calc Af Amer 37 (*)    All other components within normal limits  CBC - Abnormal; Notable for the following components:   RBC 3.84 (*)    Hemoglobin 11.5 (*)    All other components within normal limits  URINALYSIS, ROUTINE W REFLEX MICROSCOPIC - Abnormal; Notable for the following components:   Color, Urine RED (*)    APPearance CLOUDY (*)    Hgb urine dipstick LARGE (*)    Ketones, ur 5 (*)    Protein, ur 100 (*)    Leukocytes,Ua LARGE (*)    RBC / HPF >50 (*)    WBC, UA >50 (*)    Bacteria, UA MANY (*)    All other components within normal limits  URINE CULTURE  SARS CORONAVIRUS 2 (HOSPITAL ORDER, [place]  LAB)    EKG None  Radiology No results found.  Procedures Procedures (including critical care time)  Medications Ordered in ED Medications - No data to display   Initial Impression / Assessment and Plan / ED Course  I have reviewed the triage vital signs and the nursing notes.  Pertinent labs & imaging results that were available during my care of the patient were reviewed by me and considered in my medical decision making (see chart for details).        Patient known colovaginal fistula appears.  Follow-up in 3 days with Gyn Onc, however has had worsening skin changes from the poor hygiene resulting from this.  Also has had worsening of her kidney function.  Looks as if she may have a urinary tract infection also.  Although potentially could have colovesicular fistula also.  Has pelvic mass.  Patient is had worsening kidney function and UTI along with hydronephrosis feels the patient benefit from admission to the hospital since appears to be worsening in the meantime while waiting for the appointment.  Discussed with Dr. Glo Herring from gynecology.  States that Dr. Denman George from gyn oncology should be able to round on her tomorrow as an inpatient  Final Clinical Impressions(s) / ED Diagnoses   Final diagnoses:  Urinary tract infection without hematuria, site unspecified  Colovaginal fistula    ED Discharge Orders    None       Davonna Belling, MD 10/16/18 2000

## 2018-10-16 NOTE — ED Notes (Signed)
RN spoke to patient's son, Ronalee Belts, and he reports he is understanding of the situation. Patient's son reports that his mom called EMS as she was concerned about her pain, but he reports understanding that there is only so much medical intervention that can be done for a fistula.

## 2018-10-16 NOTE — H&P (Signed)
History and Physical    DEZARAE Macdonald [id number] DOB: [birth year] DOA: 10/16/2018  PCP: Bartholome Bill, MD  Patient coming from: Nursing home  I have personally briefly reviewed patient's old medical records in [place]  Chief Complaint: Vaginal burning  HPI: Alyssa Macdonald is a 73 y.o. female with medical history significant of Alzheimer's dementia, cervical dystonia, hysterectomy, recent diagnosis of rectovaginal fistula who presented with complaints of severe vaginal burning.  Patient reports that her symptoms started last night and denies any dysuria, increased frequency or urgency.  She has had numerous evaluation in the ED for similar symptom but she cannot recall this.  Patient was diagnosed in August with new right adnexal mass that is contiguous with the right aspect of her vaginal cuff after she presented to the ED with concerns of vaginal bleeding.  She was discharged with plans to follow with GYN ONC but appointment is not until later this week.  Patient denies any new fever or chills.  Denies any chest pain, shortness of breath.  Denies any nausea, vomiting or diarrhea.  Denies any new back pain.   ED Course: She was afebrile and normotensive on room air.  CBC showed no leukocytosis but has new anemia of 11.5 (from 14.4 about 3 weeks ago).  BMP shows mildly elevated creatinine of 1.56 with a baseline around 1.2.  Urinalysis shows red color urine with large leukocyte, negative nitrite.  Many bacteria and greater than 50 WBC.  Review of Systems:  Constitutional: No Weight Change, No Fever ENT/Mouth: No sore throat, No Rhinorrhea Eyes: No Eye Pain, No Vision Changes Cardiovascular: No Chest Pain, no SOB, No PND, No Dyspnea on Exertion, No Orthopnea, No Claudication, No Edema, No Palpitations Respiratory: No Cough, No Sputum, No Wheezing, Dyspnea  Gastrointestinal: No Nausea, No Vomiting, No Diarrhea, No Constipation, No Pain Genitourinary: no Urinary Incontinence,  No Urgency, No Flank Pain Musculoskeletal: No Arthralgias, No Myalgias Skin: No Skin Lesions, No Pruritus, Neuro: no Weakness, No Numbness,  No Loss of Consciousness, No Syncope Psych: No Anxiety/Panic, No Depression, decrease appetite Heme/Lymph: No Bruising, No Bleeding  Past Medical History:  Diagnosis Date  . Alzheimer's disease ([place])   . Cervical dystonia   . Complication of anesthesia    slow to awaken  . Depression   . Endometriosis    s/p total hysterectomy  . Esophageal reflux   . Essential and other specified forms of tremor   . GERD (gastroesophageal reflux disease)   . Hyperlipidemia   . Hypertension   . Hypopotassemia   . Osteoarthrosis involving, or with mention of more than one site, but not specified as generalized, site unspecified(715.80)   . Osteoarthrosis involving, or with mention of more than one site, but not specified as generalized, site unspecified(715.80)   . 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 Tremors of nervous system   . Trigger finger     Past Surgical History:  Procedure Laterality Date  . APPENDECTOMY    . CATARACT EXTRACTION Bilateral    OD 07/03/2015, OS 05/29/2015  . CHOLECYSTECTOMY    . COLON SURGERY     bowel resection  . FOOT SURGERY    . JOINT REPLACEMENT     bilateral  . TONSILLECTOMY    . TOTAL ABDOMINAL HYSTERECTOMY    . TOTAL KNEE ARTHROPLASTY     Bilateral  . TRIGGER FINGER RELEASE  02/01/2012   Procedure: RELEASE TRIGGER FINGER/A-1 PULLEY;  Surgeon: Jolyn Nap, MD;  Location: [facility];  Service: Orthopedics;  Laterality: Right;  RIGHT LONG FINGER  TRIGGER FINGER RELEASE  . WISDOM TOOTH EXTRACTION       reports that she has never smoked. She has never used smokeless tobacco. She reports current alcohol use. She reports that she does not use drugs.  Allergies  Allergen Reactions  . Zocor [Simvastatin] Other (See Comments)    Myalgia    Family History  Problem Relation Age of Onset   . CVA Mother   . Alzheimer's disease Mother   . Diabetes Father   . Heart disease Father   . Parkinson's disease Paternal Uncle   . Cataracts Sister   . Breast cancer Neg Hx    Family history reviewed and not pertinent   Prior to Admission medications   Medication Sig Start Date End Date Taking? Authorizing Provider  acetaminophen (TYLENOL) 325 MG tablet Take 650 mg by mouth every 4 (four) hours as needed for mild pain or fever.   Yes [provider]  atorvastatin (LIPITOR) 10 MG tablet Take 10 mg by mouth every evening.   Yes [provider]  clonazePAM (KLONOPIN) 0.5 MG tablet Take 1 tablet (0.5 mg total) by mouth daily. Patient taking differently: Take 0.5 mg by mouth 2 (two) times daily.  05/28/17  Yes Arrien, Jimmy Picket, MD  donepezil (ARICEPT) 10 MG tablet Take 1 tablet (10 mg total) by mouth at bedtime. 02/15/17  Yes Star Age, MD  escitalopram (LEXAPRO) 10 MG tablet Take 10 mg by mouth at bedtime.   Yes [provider]  Melatonin 5 MG TABS Take 5 mg by mouth at bedtime.   Yes [provider]  meloxicam (MOBIC) 15 MG tablet Take 15 mg by mouth daily.   Yes [provider]  memantine (NAMENDA XR) 7 MG CP24 24 hr capsule Take 7 mg by mouth daily.  08/10/17  Yes [provider]  nystatin-triamcinolone (MYCOLOG II) cream Apply 1 application topically 3 (three) times daily.   Yes [provider]  omeprazole (PRILOSEC) 10 MG capsule Take 10 mg by mouth daily.   Yes [provider]  primidone (MYSOLINE) 50 MG tablet Take 50 mg by mouth daily.   Yes [provider]  propranolol ER (INDERAL LA) 60 MG 24 hr capsule TAKE ONE CAPSULE BY MOUTH ONE TIME DAILY Patient taking differently: Take 60 mg by mouth daily.  08/13/15  Yes Star Age, MD  tolterodine (DETROL LA) 2 MG 24 hr capsule Take 2 mg by mouth daily.   Yes [provider]  traZODone (DESYREL) 50 MG tablet Take 50 mg by mouth at bedtime.   05/23/15  Yes [provider]  cephALEXin (KEFLEX) 500 MG capsule Take 1 capsule (500 mg total) by mouth 4 (four) times daily. Patient not taking: Reported on 09/23/2018 07/19/18   [name], DO  nystatin cream (MYCOSTATIN) Apply to affected area 2 times daily Patient not taking: Reported on 10/16/2018 10/11/18   Randal Buba, April, MD    Physical Exam: Vitals:   10/16/18 1531 10/16/18 1600 10/16/18 1630 10/16/18 1939  BP: (!) 104/59 103/65 117/61 115/69  Pulse: (!) 58 (!) 59 (!) 58 91  Resp:   16 20  Temp:      TempSrc:      SpO2: 99% 98% 100% 97%  Weight:      Height:        Constitutional: NAD, calm, comfortable Vitals:   10/16/18 1531 10/16/18 1600 10/16/18 1630 10/16/18  1939  BP: (!) 104/59 103/65 117/61 115/69  Pulse: (!) 58 (!) 59 (!) 58 91  Resp:   16 20  Temp:      TempSrc:      SpO2: 99% 98% 100% 97%  Weight:      Height:       Eyes: PERRL, lids and conjunctivae normal ENMT: Mucous membranes are moist. Posterior pharynx clear of any exudate or lesions.Normal dentition.  Neck: normal, supple, constant spastic movement from cervical dystonia Respiratory: clear to auscultation bilaterally, no wheezing, no crackles. Normal respiratory effort. No accessory muscle use.  Cardiovascular: Regular rate and rhythm, no murmurs / rubs / gallops. No extremity edema. 2+ pedal pulses. No carotid bruits.  Abdomen: no tenderness, no masses palpated. No hepatosplenomegaly. Bowel sounds positive. GU: large area of erythema around the vaginal and perineal area with significant amount of loose brown stool in diaper Musculoskeletal: no clubbing / cyanosis. No joint deformity upper and lower extremities. Good ROM, no contractures. Normal muscle tone.  Skin: no rashes, lesions, ulcers. No induration Neurologic: CN 2-12 grossly intact. Sensation intact, DTR normal. Strength 5/5 in all 4.  Psychiatric: Normal judgment and insight. Alert and oriented x 2 - cannot recall time. Normal mood.      Labs on Admission: I have personally reviewed following labs and imaging studies  CBC: Recent Labs  Lab 10/16/18 1532  WBC 7.7  HGB 11.5*  HCT 36.0  MCV 93.8  PLT [id number]   Basic Metabolic Panel: Recent Labs  Lab 10/16/18 1532  NA 139  K 3.4*  CL 105  CO2 23  GLUCOSE 95  BUN 15  CREATININE 1.56*  CALCIUM 8.8*   GFR: Estimated Creatinine Clearance: 35.1 mL/min (A) (by C-G formula based on SCr of 1.56 mg/dL (H)). Liver Function Tests: No results for input(s): AST, ALT, ALKPHOS, BILITOT, PROT, ALBUMIN in the last 168 hours. No results for input(s): LIPASE, AMYLASE in the last 168 hours. No results for input(s): AMMONIA in the last 168 hours. Coagulation Profile: No results for input(s): INR, PROTIME in the last 168 hours. Cardiac Enzymes: No results for input(s): CKTOTAL, CKMB, CKMBINDEX, TROPONINI in the last 168 hours. BNP (last 3 results) No results for input(s): PROBNP in the last 8760 hours. HbA1C: No results for input(s): HGBA1C in the last 72 hours. CBG: No results for input(s): GLUCAP in the last 168 hours. Lipid Profile: No results for input(s): CHOL, HDL, LDLCALC, TRIG, CHOLHDL, LDLDIRECT in the last 72 hours. Thyroid Function Tests: No results for input(s): TSH, T4TOTAL, FREET4, T3FREE, THYROIDAB in the last 72 hours. Anemia Panel: No results for input(s): VITAMINB12, FOLATE, FERRITIN, TIBC, IRON, RETICCTPCT in the last 72 hours. Urine analysis:    Component Value Date/Time   COLORURINE RED (A) 10/16/2018 1636   APPEARANCEUR CLOUDY (A) 10/16/2018 1636   LABSPEC 1.023 10/16/2018 1636   PHURINE 5.0 10/16/2018 1636   GLUCOSEU NEGATIVE 10/16/2018 1636   HGBUR LARGE (A) 10/16/2018 1636   BILIRUBINUR NEGATIVE 10/16/2018 1636   BILIRUBINUR negative 04/18/2013 1223   KETONESUR 5 (A) 10/16/2018 1636   PROTEINUR 100 (A) 10/16/2018 1636   UROBILINOGEN negative 04/18/2013 1223   UROBILINOGEN 1.0 01/31/2008 0717   NITRITE NEGATIVE 10/16/2018 1636    LEUKOCYTESUR LARGE (A) 10/16/2018 1636    Radiological Exams on Admission: No results found.  EKG: Independently reviewed.   Assessment/Plan  Vaginal irritation secondary to rectovaginal fistula - nystatin cream. Continue to keep perineal area clean- unfortunately this will be difficult with fistula and  continual moisture from stool causing dermatitis. - GYN consulted by EDP to see in the morning   UTI - IV Rocephin  Normocytic anemia  - suspect from hematuria and vaginal mass - will get iron panel for completeness  AKI  - IV fluids- likely pre-renal  - monitor BMP   Dementia -Continue home medication  Cervical dystonia -Continue home medication  DVT prophylaxis: Lovenox Code Status:Full Family Communication: Plan discussed with patient. Daughter [name]- [phone] Disposition Plan: nursing facility and will require observation stay for GYN consult Consults called: GYN  Admission status: Obs     Fritzi Scripter T Riccardo Holeman DO [facility]   If 7PM-7AM, please contact night-coverage [email or web address] Password [facility]  10/16/2018, 8:47 PM

## 2018-10-16 NOTE — ED Triage Notes (Signed)
Pt arrives to ED via [facility] coming from [facility] on [address]. EMS reports that pt has been seen multiple times for the same at the ED and has a GYN appointment on 9/16 for the same thing. Nursing staff reports that pt "masterbates frequently", pt c/o pain "down there" that feels "like acid" denies any discharge or difficulty urinating. Pt is CA&Ox4 and is ambulatory.

## 2018-10-17 DIAGNOSIS — G309 Alzheimer's disease, unspecified: Secondary | ICD-10-CM | POA: Diagnosis present

## 2018-10-17 DIAGNOSIS — I959 Hypotension, unspecified: Secondary | ICD-10-CM | POA: Diagnosis not present

## 2018-10-17 DIAGNOSIS — N898 Other specified noninflammatory disorders of vagina: Secondary | ICD-10-CM | POA: Diagnosis not present

## 2018-10-17 DIAGNOSIS — E876 Hypokalemia: Secondary | ICD-10-CM | POA: Diagnosis present

## 2018-10-17 DIAGNOSIS — Z20828 Contact with and (suspected) exposure to other viral communicable diseases: Secondary | ICD-10-CM | POA: Diagnosis present

## 2018-10-17 DIAGNOSIS — Z23 Encounter for immunization: Secondary | ICD-10-CM | POA: Diagnosis not present

## 2018-10-17 DIAGNOSIS — F0280 Dementia in other diseases classified elsewhere without behavioral disturbance: Secondary | ICD-10-CM | POA: Diagnosis not present

## 2018-10-17 DIAGNOSIS — G243 Spasmodic torticollis: Secondary | ICD-10-CM | POA: Diagnosis present

## 2018-10-17 DIAGNOSIS — N281 Cyst of kidney, acquired: Secondary | ICD-10-CM | POA: Diagnosis present

## 2018-10-17 DIAGNOSIS — E785 Hyperlipidemia, unspecified: Secondary | ICD-10-CM | POA: Diagnosis present

## 2018-10-17 DIAGNOSIS — N823 Fistula of vagina to large intestine: Secondary | ICD-10-CM | POA: Diagnosis present

## 2018-10-17 DIAGNOSIS — D62 Acute posthemorrhagic anemia: Secondary | ICD-10-CM | POA: Diagnosis present

## 2018-10-17 DIAGNOSIS — N179 Acute kidney failure, unspecified: Secondary | ICD-10-CM | POA: Diagnosis present

## 2018-10-17 DIAGNOSIS — N183 Chronic kidney disease, stage 3 (moderate): Secondary | ICD-10-CM | POA: Diagnosis present

## 2018-10-17 DIAGNOSIS — Z66 Do not resuscitate: Secondary | ICD-10-CM | POA: Diagnosis present

## 2018-10-17 DIAGNOSIS — D649 Anemia, unspecified: Secondary | ICD-10-CM

## 2018-10-17 DIAGNOSIS — C189 Malignant neoplasm of colon, unspecified: Secondary | ICD-10-CM | POA: Diagnosis present

## 2018-10-17 DIAGNOSIS — N824 Other female intestinal-genital tract fistulae: Secondary | ICD-10-CM | POA: Diagnosis not present

## 2018-10-17 DIAGNOSIS — R1900 Intra-abdominal and pelvic swelling, mass and lump, unspecified site: Secondary | ICD-10-CM | POA: Diagnosis present

## 2018-10-17 DIAGNOSIS — N390 Urinary tract infection, site not specified: Secondary | ICD-10-CM | POA: Diagnosis not present

## 2018-10-17 DIAGNOSIS — I129 Hypertensive chronic kidney disease with stage 1 through stage 4 chronic kidney disease, or unspecified chronic kidney disease: Secondary | ICD-10-CM | POA: Diagnosis present

## 2018-10-17 DIAGNOSIS — N2882 Megaloureter: Secondary | ICD-10-CM | POA: Diagnosis present

## 2018-10-17 DIAGNOSIS — K5730 Diverticulosis of large intestine without perforation or abscess without bleeding: Secondary | ICD-10-CM | POA: Diagnosis present

## 2018-10-17 DIAGNOSIS — N993 Prolapse of vaginal vault after hysterectomy: Secondary | ICD-10-CM | POA: Diagnosis present

## 2018-10-17 DIAGNOSIS — D509 Iron deficiency anemia, unspecified: Secondary | ICD-10-CM | POA: Diagnosis present

## 2018-10-17 DIAGNOSIS — N136 Pyonephrosis: Secondary | ICD-10-CM | POA: Diagnosis present

## 2018-10-17 DIAGNOSIS — K219 Gastro-esophageal reflux disease without esophagitis: Secondary | ICD-10-CM | POA: Diagnosis present

## 2018-10-17 DIAGNOSIS — R269 Unspecified abnormalities of gait and mobility: Secondary | ICD-10-CM | POA: Diagnosis present

## 2018-10-17 DIAGNOSIS — R102 Pelvic and perineal pain: Secondary | ICD-10-CM | POA: Diagnosis not present

## 2018-10-17 DIAGNOSIS — R9389 Abnormal findings on diagnostic imaging of other specified body structures: Secondary | ICD-10-CM | POA: Diagnosis not present

## 2018-10-17 LAB — IRON AND TIBC
Iron: 26 ug/dL — ABNORMAL LOW (ref 28–170)
Saturation Ratios: 12 % (ref 10.4–31.8)
TIBC: 212 ug/dL — ABNORMAL LOW (ref 250–450)
UIBC: 186 ug/dL

## 2018-10-17 LAB — CBC
HCT: 34.7 % — ABNORMAL LOW (ref 36.0–46.0)
Hemoglobin: 11 g/dL — ABNORMAL LOW (ref 12.0–15.0)
MCH: 29.8 pg (ref 26.0–34.0)
MCHC: 31.7 g/dL (ref 30.0–36.0)
MCV: 94 fL (ref 80.0–100.0)
Platelets: 311 10*3/uL (ref 150–400)
RBC: 3.69 MIL/uL — ABNORMAL LOW (ref 3.87–5.11)
RDW: 12.7 % (ref 11.5–15.5)
WBC: 7.5 10*3/uL (ref 4.0–10.5)
nRBC: 0 % (ref 0.0–0.2)

## 2018-10-17 LAB — FERRITIN: Ferritin: 148 ng/mL (ref 11–307)

## 2018-10-17 LAB — BASIC METABOLIC PANEL
Anion gap: 10 (ref 5–15)
BUN: 13 mg/dL (ref 8–23)
CO2: 22 mmol/L (ref 22–32)
Calcium: 8.6 mg/dL — ABNORMAL LOW (ref 8.9–10.3)
Chloride: 108 mmol/L (ref 98–111)
Creatinine, Ser: 1.37 mg/dL — ABNORMAL HIGH (ref 0.44–1.00)
GFR calc Af Amer: 44 mL/min — ABNORMAL LOW (ref >60)
GFR calc non Af Amer: 38 mL/min — ABNORMAL LOW (ref >60)
Glucose, Bld: 91 mg/dL (ref 70–99)
Potassium: 3.4 mmol/L — ABNORMAL LOW (ref 3.5–5.1)
Sodium: 140 mmol/L (ref 135–145)

## 2018-10-17 MED ORDER — TRAMADOL HCL 50 MG PO TABS
50.0000 mg | ORAL_TABLET | Freq: Two times a day (BID) | ORAL | Status: AC
  Administered 2018-10-17 (×2): 50 mg via ORAL
  Filled 2018-10-17 (×3): qty 1

## 2018-10-17 MED ORDER — MEMANTINE HCL ER 7 MG PO CP24
7.0000 mg | ORAL_CAPSULE | Freq: Every day | ORAL | Status: DC
  Administered 2018-10-17 – 2018-10-28 (×12): 7 mg via ORAL
  Filled 2018-10-17 (×12): qty 1

## 2018-10-17 MED ORDER — PROPRANOLOL HCL ER 60 MG PO CP24
60.0000 mg | ORAL_CAPSULE | Freq: Every day | ORAL | Status: DC
  Administered 2018-10-17: 60 mg via ORAL
  Filled 2018-10-17 (×2): qty 1

## 2018-10-17 MED ORDER — MELATONIN 5 MG PO TABS
5.0000 mg | ORAL_TABLET | Freq: Every day | ORAL | Status: DC
  Administered 2018-10-17 – 2018-10-27 (×12): 5 mg via ORAL
  Filled 2018-10-17 (×13): qty 1

## 2018-10-17 MED ORDER — NYSTATIN-TRIAMCINOLONE 100000-0.1 UNIT/GM-% EX CREA
1.0000 "application " | TOPICAL_CREAM | Freq: Three times a day (TID) | CUTANEOUS | Status: DC
  Administered 2018-10-17 – 2018-10-28 (×34): 1 via TOPICAL
  Filled 2018-10-17 (×5): qty 30

## 2018-10-17 MED ORDER — CLONAZEPAM 0.5 MG PO TABS
0.5000 mg | ORAL_TABLET | Freq: Two times a day (BID) | ORAL | Status: DC
  Administered 2018-10-17 – 2018-10-28 (×24): 0.5 mg via ORAL
  Filled 2018-10-17 (×24): qty 1

## 2018-10-17 MED ORDER — PRIMIDONE 50 MG PO TABS
50.0000 mg | ORAL_TABLET | Freq: Every day | ORAL | Status: DC
  Administered 2018-10-17 – 2018-10-28 (×12): 50 mg via ORAL
  Filled 2018-10-17 (×12): qty 1

## 2018-10-17 MED ORDER — POTASSIUM CHLORIDE CRYS ER 20 MEQ PO TBCR
40.0000 meq | EXTENDED_RELEASE_TABLET | Freq: Once | ORAL | Status: AC
  Administered 2018-10-17: 40 meq via ORAL
  Filled 2018-10-17: qty 2

## 2018-10-17 MED ORDER — ESCITALOPRAM OXALATE 10 MG PO TABS
10.0000 mg | ORAL_TABLET | Freq: Every day | ORAL | Status: DC
  Administered 2018-10-17 – 2018-10-27 (×12): 10 mg via ORAL
  Filled 2018-10-17 (×13): qty 1

## 2018-10-17 MED ORDER — ACETAMINOPHEN 325 MG PO TABS
650.0000 mg | ORAL_TABLET | ORAL | Status: DC | PRN
  Administered 2018-10-17 – 2018-10-27 (×6): 650 mg via ORAL
  Filled 2018-10-17 (×7): qty 2

## 2018-10-17 MED ORDER — FESOTERODINE FUMARATE ER 4 MG PO TB24
4.0000 mg | ORAL_TABLET | Freq: Every day | ORAL | Status: DC
  Administered 2018-10-17 – 2018-10-28 (×12): 4 mg via ORAL
  Filled 2018-10-17 (×13): qty 1

## 2018-10-17 MED ORDER — ATORVASTATIN CALCIUM 10 MG PO TABS
10.0000 mg | ORAL_TABLET | Freq: Every evening | ORAL | Status: DC
  Administered 2018-10-17 – 2018-10-27 (×11): 10 mg via ORAL
  Filled 2018-10-17 (×11): qty 1

## 2018-10-17 MED ORDER — PANTOPRAZOLE SODIUM 40 MG PO TBEC
40.0000 mg | DELAYED_RELEASE_TABLET | Freq: Every day | ORAL | Status: DC
  Administered 2018-10-17 – 2018-10-28 (×12): 40 mg via ORAL
  Filled 2018-10-17 (×12): qty 1

## 2018-10-17 MED ORDER — TRAZODONE HCL 50 MG PO TABS
50.0000 mg | ORAL_TABLET | Freq: Every day | ORAL | Status: DC
  Administered 2018-10-17 – 2018-10-27 (×12): 50 mg via ORAL
  Filled 2018-10-17 (×12): qty 1

## 2018-10-17 MED ORDER — DONEPEZIL HCL 10 MG PO TABS
10.0000 mg | ORAL_TABLET | Freq: Every day | ORAL | Status: DC
  Administered 2018-10-17 – 2018-10-27 (×12): 10 mg via ORAL
  Filled 2018-10-17 (×12): qty 1

## 2018-10-17 NOTE — Evaluation (Addendum)
Physical Therapy Evaluation Patient Details Name: Alyssa Macdonald MRN: [id number] DOB: January 02, 1946 Today's Date: 10/17/2018   History of Present Illness  73 y.o.female with medical history significant of Alzheimer's dementia, cervical dystonia, hysterectomy, bil TKAs, HTN, essential tremor,  recent diagnosis of rectovaginal fistula who presented with complaints of severe vaginal burning.  Clinical Impression  Pt admitted with above diagnosis. Recommend SNF unless family can provide 24 hour assist (or pt is from ALF??)   Pt currently with functional limitations due to the deficits listed below (see PT Problem List). Pt will benefit from skilled PT to increase their independence and safety with mobility to allow discharge to the venue listed below.       Follow Up Recommendations SNF    Equipment Recommendations  Rolling walker with 5" wheels    Recommendations for Other Services       Precautions / Restrictions Precautions Precautions: Fall Restrictions Weight Bearing Restrictions: No      Mobility  Bed Mobility Overal bed mobility: Needs Assistance Bed Mobility: Sit to Supine;Supine to Sit     Supine to sit: Min assist;HOB elevated Sit to supine: Min assist;HOB elevated   General bed mobility comments: assist with LEs,incr time  Transfers Overall transfer level: Needs assistance Equipment used: None Transfers: Sit to/from Stand Sit to Stand: Min assist;Min guard         General transfer comment: heavy reliance on UEs, incr time, unsteady on rising  Ambulation/Gait Ambulation/Gait assistance: Min assist Gait Distance (Feet): 15 Feet(x2) Assistive device: Rolling walker (2 wheeled);1 person hand held assist Gait Pattern/deviations: Step-through pattern;Decreased stride length Gait velocity: decr   General Gait Details: cues for RW safety and position, improved stability with RW, initial 15' iwth HHA, pt attempts to reach for bed/wall etc  Stairs             Wheelchair Mobility    Modified Rankin (Stroke Patients Only)       Balance                                             Pertinent Vitals/Pain Pain Assessment: Faces Faces Pain Scale: Hurts little more Pain Location: vaginal/rectal Pain Descriptors / Indicators: Burning Pain Intervention(s): Monitored during session;Other (comment)(barrier cream)    Home Living Family/patient expects to be discharged to:: Private residence                 Additional Comments: unsure of pt's living situation. pt states she lives ina one level home and is independent. no family present to confirm; recent notes state pt resides at [facility]    Prior Function Level of Independence: Independent         Comments: per pt she is independent     Hand Dominance        Extremity/Trunk Assessment   Upper Extremity Assessment Upper Extremity Assessment: Generalized weakness    Lower Extremity Assessment Lower Extremity Assessment: Generalized weakness       Communication   Communication: No difficulties  Cognition Arousal/Alertness: Awake/alert Behavior During Therapy: WFL for tasks assessed/performed Overall Cognitive Status: History of cognitive impairments - at baseline                                        General Comments  Exercises     Assessment/Plan    PT Assessment Patient needs continued PT services  PT Problem List Decreased strength;Decreased activity tolerance;Decreased balance;Decreased cognition;Decreased mobility       PT Treatment Interventions DME instruction;Therapeutic exercise;Gait training;Functional mobility training;Therapeutic activities;Patient/family education    PT Goals (Current goals can be found in the Care Plan section)  Acute Rehab PT Goals PT Goal Formulation: With patient Time For Goal Achievement: 10/31/18 Potential to Achieve Goals: Good    Frequency Min 2X/week   Barriers to  discharge        Co-evaluation               AM-PAC PT "6 Clicks" Mobility  Outcome Measure Help needed turning from your back to your side while in a flat bed without using bedrails?: A Little Help needed moving from lying on your back to sitting on the side of a flat bed without using bedrails?: A Little Help needed moving to and from a bed to a chair (including a wheelchair)?: A Little Help needed standing up from a chair using your arms (e.g., wheelchair or bedside chair)?: A Little Help needed to walk in hospital room?: A Little Help needed climbing 3-5 steps with a railing? : A Lot 6 Click Score: 17    End of Session Equipment Utilized During Treatment: Gait belt Activity Tolerance: Patient tolerated treatment well;Patient limited by fatigue Patient left: in bed;with call bell/phone within reach;with bed alarm set   PT Visit Diagnosis: Unsteadiness on feet (R26.81)    Time: [id number] PT Time Calculation (min) (ACUTE ONLY): 19 min   Charges:   PT Evaluation $PT Eval Low Complexity: 1 Low          Kenyon Ana, PT  Pager: [phone] Acute Rehab Dept [facility]): [id number]   10/17/2018   [facility] 10/17/2018, 11:39 AM

## 2018-10-17 NOTE — Progress Notes (Signed)
Pt found sleeping in bed with IV pulled out and a trail of blood from IV leading to the bathroom at 0615. Pt checked and bed alarm was noted to be ON at 0400 and 0500 by RN & NT. IV catheter found and was noted intact. Pt cleaned up, and assessed. No new injuries.  Pt resting comfortably and doesn't remember getting up. Floor mats were placed when pt got to 5E. Will continue to monitor.

## 2018-10-17 NOTE — Progress Notes (Signed)
PROGRESS NOTE  Alyssa Macdonald [id number] DOB: 12-07-1945 DOA: 10/16/2018 PCP: Bartholome Bill, MD  HPI/Recap of past 24 hours: Alyssa Macdonald is a 73 y.o. female with medical history significant of Alzheimer's dementia, cervical dystonia, hysterectomy, recent diagnosis of rectovaginal fistula who presented with complaints of severe vaginal burning.  Patient reports that her symptoms started last night and denies any dysuria, increased frequency or urgency.  She has had numerous evaluation in the ED for similar symptom but she cannot recall this.  Patient was diagnosed in August with new right adnexal mass that is contiguous with the right aspect of her vaginal cuff after she presented to the ED with concerns of vaginal bleeding.  She was discharged with plans to follow with GYN ONC but appointment is not until later this week.  Patient denies any new fever or chills.  Denies any chest pain, shortness of breath.  Denies any nausea, vomiting or diarrhea.  Denies any new back pain.   ED Course: She was afebrile and normotensive on room air.  CBC showed no leukocytosis but has new anemia of 11.5 (from 14.4 about 3 weeks ago).  BMP shows mildly elevated creatinine of 1.56 with a baseline around 1.2.  Urinalysis shows red color urine with large leukocyte, negative nitrite.  Many bacteria and greater than 50 WBC.  10/17/18: Patient was seen and examined at her bedside.  She is pleasant alert in the setting of dementia.  Reports burning pain in vagina.  Tylenol not improving symptoms, started tramadol 50 mg twice daily x2-day.  Awaiting gynecology consult/recommendations.  Continue Rocephin empirically.    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 KCl 40 mEq once Repeat BMP in the morning  CKD 3 Creatinine appears to be at baseline 1.3 with GFR of 38 Avoid nephrotoxins Continue gentle IV fluid hydration Monitor urine output BMP in the morning  Acute blood loss anemia/iron deficiency anemia Suspected vagina bleed from rectovaginal fistula Iron studies with indication for deficiency Start ferrous sulfate 325 mg daily Hemoglobin stable at 11.0 from 12.5 yesterday Continue to monitor H&H  Dementia  Continue home medications Reorient as necessary Fall precautions  Chronic depression/anxiety Continue Klonopin 0.5 mg twice daily Continue Lexapro 10 mg nightly  History of cervical dystonia Continue home medications  Physical debility/ambulatory dysfunction PT assessment recommended SNF Recommended rolling walker with 5 inches wheels CSW consulted to assist with disposition. Continue fall precautions   Risks: Patient is at high risk for decompensation due to rectal vagina fistula with persistent symptomatology, suspected UTI with multiple comorbidities and advanced age.  Patient will require at least 2 midnights for further evaluation and treatment of present condition.  DVT prophylaxis: Lovenox Code Status:Full Family Communication: Plan discussed with patient. Daughter [name]- [phone] Disposition Plan:  Likely discharge to SNF once Gyn signs off and is hemodynamically stable.  Consults called: GYN     Objective: Vitals:   10/16/18 1939 10/16/18 2100 10/16/18 2205 10/17/18 0624  BP: 115/69 127/67 139/81 105/65  Pulse: 91 (!) 55 60 (!) 57  Resp: 20  18 18 20   Temp:   99.1 F (37.3 C) 98.6 F (37 C)  TempSrc:      SpO2: 97% 98% 100%  97%  Weight:      Height:        Intake/Output Summary (Last 24 hours) at 10/17/2018 1238 Last data filed at 10/17/2018 1001 Gross per 24 hour  Intake 770 ml  Output -  Net 770 ml   Filed Weights   10/16/18 1458  Weight: 86.2 kg    Exam:  . General: 73 y.o. year-old female well developed well nourished in no acute distress.  Pleasant, alert in the setting of dementia. . Cardiovascular: Regular rate and rhythm with no rubs or gallops.  No thyromegaly or JVD noted.   Marland Kitchen Respiratory: Clear to auscultation with no wheezes or rales. Good inspiratory effort. . Abdomen: Soft nontender nondistended with normal bowel sounds x4 quadrants. . Musculoskeletal: No lower extremity edema. 2/4 pulses in all 4 extremities. Marland Kitchen Psychiatry: Mood is appropriate for condition and setting   Data Reviewed: CBC: Recent Labs  Lab 10/16/18 1532 10/16/18 2107 10/17/18 0649  WBC 7.7 9.8 7.5  HGB 11.5* 12.5 11.0*  HCT 36.0 38.2 34.7*  MCV 93.8 92.9 94.0  PLT 352 353 [id number]   Basic Metabolic Panel: Recent Labs  Lab 10/16/18 1532 10/16/18 2107 10/17/18 0649  NA 139  --  140  K 3.4*  --  3.4*  CL 105  --  108  CO2 23  --  22  GLUCOSE 95  --  91  BUN 15  --  13  CREATININE 1.56* 1.40* 1.37*  CALCIUM 8.8*  --  8.6*   GFR: Estimated Creatinine Clearance: 40 mL/min (A) (by C-G formula based on SCr of 1.37 mg/dL (H)). Liver Function Tests: No results for input(s): AST, ALT, ALKPHOS, BILITOT, PROT, ALBUMIN in the last 168 hours. No results for input(s): LIPASE, AMYLASE in the last 168 hours. No results for input(s): AMMONIA in the last 168 hours. Coagulation Profile: No results for input(s): INR, PROTIME in the last 168 hours. Cardiac Enzymes: No results for input(s): CKTOTAL, CKMB, CKMBINDEX, TROPONINI in the last 168 hours. BNP (last 3 results) No results for input(s): PROBNP in the last 8760 hours. HbA1C: No results for input(s): HGBA1C in the last 72 hours. CBG: No results for input(s):  GLUCAP in the last 168 hours. Lipid Profile: No results for input(s): CHOL, HDL, LDLCALC, TRIG, CHOLHDL, LDLDIRECT in the last 72 hours. Thyroid Function Tests: No results for input(s): TSH, T4TOTAL, FREET4, T3FREE, THYROIDAB in the last 72 hours. Anemia Panel: Recent Labs    10/17/18 0649  FERRITIN 148  TIBC 212*  IRON 26*   Urine analysis:    Component Value Date/Time   COLORURINE RED (A) 10/16/2018 1636   APPEARANCEUR CLOUDY (A) 10/16/2018 1636   LABSPEC 1.023 10/16/2018 1636   PHURINE 5.0 10/16/2018 1636   GLUCOSEU NEGATIVE 10/16/2018 1636   HGBUR LARGE (A) 10/16/2018 1636   BILIRUBINUR NEGATIVE 10/16/2018 1636   BILIRUBINUR negative 04/18/2013 1223   KETONESUR 5 (A) 10/16/2018 1636   PROTEINUR 100 (A) 10/16/2018 1636   UROBILINOGEN negative 04/18/2013 1223   UROBILINOGEN 1.0 01/31/2008 0717   NITRITE NEGATIVE 10/16/2018 1636   LEUKOCYTESUR LARGE (A) 10/16/2018 1636   Sepsis Labs: @LABRCNTIP (procalcitonin:4,lacticidven:4)  ) Recent Results (from the past 240 hour(s))  SARS Coronavirus 2 [facility] order, Performed in [facility] hospital lab) Nasopharyngeal Nasopharyngeal Swab     Status: None   Collection Time: 10/16/18  9:07 PM   Specimen: Nasopharyngeal Swab  Result Value Ref Range Status   SARS Coronavirus 2 NEGATIVE NEGATIVE Final    Comment: (NOTE) If result is NEGATIVE SARS-CoV-2 target nucleic acids are NOT DETECTED. The SARS-CoV-2 RNA is generally detectable in upper and lower  respiratory specimens during the acute phase of infection. The lowest  concentration of SARS-CoV-2 viral copies this assay can detect is 250  copies / mL. A negative result does not preclude SARS-CoV-2 infection  and should not be used as the sole basis for treatment or other  patient management decisions.  A negative result may occur with  improper specimen collection / handling, submission of specimen other  than nasopharyngeal swab, presence of viral mutation(s) within the   areas targeted by this assay, and inadequate number of viral copies  (<250 copies / mL). A negative result must be combined with clinical  observations, patient history, and epidemiological information. If result is POSITIVE SARS-CoV-2 target nucleic acids are DETECTED. The SARS-CoV-2 RNA is generally detectable in upper and lower  respiratory specimens dur ing the acute phase of infection.  Positive  results are indicative of active infection with SARS-CoV-2.  Clinical  correlation with patient history and other diagnostic information is  necessary to determine patient infection status.  Positive results do  not rule out bacterial infection or co-infection with other viruses. If result is PRESUMPTIVE POSTIVE SARS-CoV-2 nucleic acids MAY BE PRESENT.   A presumptive positive result was obtained on the submitted specimen  and confirmed on repeat testing.  While 2019 novel coronavirus  (SARS-CoV-2) nucleic acids may be present in the submitted sample  additional confirmatory testing may be necessary for epidemiological  and / or clinical management purposes  to differentiate between  SARS-CoV-2 and other Sarbecovirus currently known to infect humans.  If clinically indicated additional testing with an alternate test  methodology [phone]) is advised. The SARS-CoV-2 RNA is generally  detectable in upper and lower respiratory sp ecimens during the acute  phase of infection. The expected result is Negative. Fact Sheet for Patients:  StrictlyIdeas.no Fact Sheet for Healthcare Providers: BankingDealers.co.za This test is not yet approved or cleared by the [place] FDA and has been authorized for detection and/or diagnosis of SARS-CoV-2 by FDA under an Emergency Use Authorization (EUA).  This EUA will remain in effect (meaning this test can be used) for the duration of the COVID-19 declaration under Section 564(b)(1) of the Act, 21 U.S.C.  section 360bbb-3(b)(1), unless the authorization is terminated or revoked sooner. Performed at [facility], [place] [address] [place], [place] [address]       Studies: No results found.  Scheduled Meds: . atorvastatin  10 mg Oral QPM  . clonazePAM  0.5 mg Oral BID  . donepezil  10 mg Oral QHS  . enoxaparin (LOVENOX) injection  40 mg Subcutaneous QHS  . escitalopram  10 mg Oral QHS  . fesoterodine  4 mg Oral Daily  . Melatonin  5 mg Oral QHS  . memantine  7 mg Oral Daily  . nystatin-triamcinolone  1 application Topical TID  . pantoprazole  40 mg Oral Daily  . primidone  50 mg Oral Daily  . propranolol ER  60 mg Oral Daily  . traMADol  50 mg Oral Q12H  . traZODone  50 mg Oral QHS    Continuous Infusions: . sodium chloride 50 mL/hr at 10/16/18 2125  . cefTRIAXone (ROCEPHIN)  IV       LOS: 0  days     Kayleen Memos, MD [facility] Pager [phone]  If 7PM-7AM, please contact night-coverage [email or web address] Password [facility] 10/17/2018, 12:38 PM

## 2018-10-18 ENCOUNTER — Encounter (HOSPITAL_COMMUNITY): Payer: Self-pay | Admitting: *Deleted

## 2018-10-18 DIAGNOSIS — N824 Other female intestinal-genital tract fistulae: Secondary | ICD-10-CM

## 2018-10-18 DIAGNOSIS — N898 Other specified noninflammatory disorders of vagina: Secondary | ICD-10-CM

## 2018-10-18 DIAGNOSIS — R9389 Abnormal findings on diagnostic imaging of other specified body structures: Secondary | ICD-10-CM

## 2018-10-18 LAB — CBC WITH DIFFERENTIAL/PLATELET
Abs Immature Granulocytes: 0.04 10*3/uL (ref 0.00–0.07)
Basophils Absolute: 0 10*3/uL (ref 0.0–0.1)
Basophils Relative: 0 %
Eosinophils Absolute: 0.2 10*3/uL (ref 0.0–0.5)
Eosinophils Relative: 2 %
HCT: 34.4 % — ABNORMAL LOW (ref 36.0–46.0)
Hemoglobin: 11 g/dL — ABNORMAL LOW (ref 12.0–15.0)
Immature Granulocytes: 1 %
Lymphocytes Relative: 19 %
Lymphs Abs: 1.5 10*3/uL (ref 0.7–4.0)
MCH: 30.1 pg (ref 26.0–34.0)
MCHC: 32 g/dL (ref 30.0–36.0)
MCV: 94 fL (ref 80.0–100.0)
Monocytes Absolute: 0.7 10*3/uL (ref 0.1–1.0)
Monocytes Relative: 9 %
Neutro Abs: 5.2 10*3/uL (ref 1.7–7.7)
Neutrophils Relative %: 69 %
Platelets: 314 10*3/uL (ref 150–400)
RBC: 3.66 MIL/uL — ABNORMAL LOW (ref 3.87–5.11)
RDW: 12.7 % (ref 11.5–15.5)
WBC: 7.6 10*3/uL (ref 4.0–10.5)
nRBC: 0 % (ref 0.0–0.2)

## 2018-10-18 LAB — URINE CULTURE

## 2018-10-18 LAB — COMPREHENSIVE METABOLIC PANEL
ALT: 9 U/L (ref 0–44)
AST: 12 U/L — ABNORMAL LOW (ref 15–41)
Albumin: 3.3 g/dL — ABNORMAL LOW (ref 3.5–5.0)
Alkaline Phosphatase: 60 U/L (ref 38–126)
Anion gap: 9 (ref 5–15)
BUN: 16 mg/dL (ref 8–23)
CO2: 21 mmol/L — ABNORMAL LOW (ref 22–32)
Calcium: 9.1 mg/dL (ref 8.9–10.3)
Chloride: 107 mmol/L (ref 98–111)
Creatinine, Ser: 1.3 mg/dL — ABNORMAL HIGH (ref 0.44–1.00)
GFR calc Af Amer: 46 mL/min — ABNORMAL LOW (ref >60)
GFR calc non Af Amer: 40 mL/min — ABNORMAL LOW (ref >60)
Glucose, Bld: 98 mg/dL (ref 70–99)
Potassium: 4.1 mmol/L (ref 3.5–5.1)
Sodium: 137 mmol/L (ref 135–145)
Total Bilirubin: 0.4 mg/dL (ref 0.3–1.2)
Total Protein: 5.5 g/dL — ABNORMAL LOW (ref 6.5–8.1)

## 2018-10-18 LAB — MAGNESIUM: Magnesium: 1.8 mg/dL (ref 1.7–2.4)

## 2018-10-18 LAB — PHOSPHORUS: Phosphorus: 4.2 mg/dL (ref 2.5–4.6)

## 2018-10-18 MED ORDER — FERROUS SULFATE 325 (65 FE) MG PO TABS
325.0000 mg | ORAL_TABLET | Freq: Every day | ORAL | Status: DC
  Administered 2018-10-18 – 2018-10-28 (×11): 325 mg via ORAL
  Filled 2018-10-18 (×11): qty 1

## 2018-10-18 MED ORDER — SODIUM CHLORIDE 0.9 % IV BOLUS
500.0000 mL | Freq: Once | INTRAVENOUS | Status: AC
  Administered 2018-10-18: 500 mL via INTRAVENOUS

## 2018-10-18 MED ORDER — PROPRANOLOL HCL ER 60 MG PO CP24
60.0000 mg | ORAL_CAPSULE | Freq: Every day | ORAL | Status: DC
  Filled 2018-10-18: qty 1

## 2018-10-18 NOTE — Progress Notes (Signed)
PROGRESS NOTE  Alyssa Macdonald [id number] DOB: 11/18/1945 DOA: 10/16/2018 PCP: Bartholome Bill, MD  HPI/Recap of past 24 hours: Alyssa Macdonald is a 73 y.o. female with medical history significant of Alzheimer's dementia, cervical dystonia, hysterectomy, recent diagnosis of rectovaginal fistula who presented with complaints of severe vaginal burning.  Patient reports that her symptoms started last night and denies any dysuria, increased frequency or urgency.  She has had numerous evaluation in the ED for similar symptom but she cannot recall this.  Patient was diagnosed in August with new right adnexal mass that is contiguous with the right aspect of her vaginal cuff after she presented to the ED with concerns of vaginal bleeding.  She was discharged with plans to follow with GYN ONC but appointment is not until later this week.  Patient denies any new fever or chills.  Denies any chest pain, shortness of breath.  Denies any nausea, vomiting or diarrhea.  Denies any new back pain.   ED Course: She was afebrile and normotensive on room air.  CBC showed no leukocytosis but has new anemia of 11.5 (from 14.4 about 3 weeks ago).  BMP shows mildly elevated creatinine of 1.56 with a baseline around 1.2.  Urinalysis shows red color urine with large leukocyte, negative nitrite.  Many bacteria and greater than 50 WBC.  10/18/18: Patient was seen and examined at her bedside this morning.  She is pleasantly demented.  Reports persistent burning pain in her vagina.  Discussed with Dr. Denman George who will see the patient in consultation today.  Consulted interventional radiology for possible biopsy of vagina mass and general surgery for evaluation of fistula.    Assessment/Plan: Active Problems:   Rectovaginal fistula   Anemia  Known rectovaginal fistula with intractable burning pain in vagina Symptoms not improved with Tylenol Continue tramadol 50 mg twice daily x2 days Discussed with Dr. Denman George  gynecology oncology who will see in consultation today Consulted general surgery for evaluation of fistula Consulted interventional radiology for possible biopsy of vaginal mass Continue to manage symptomatically  Suspected UTI UA positive for pyuria Urine culture showed multiple bacterial morphotypes none predominant.  Suggest appropriate recollection. On Rocephin empirically Continue to monitor fever curve and WBC  Acute hypotension complicated by sinus bradycardia, possibly iatrogenic Map in the low 60s and heart rate in the low 50s this morning 500 cc normal saline bolus ordered to be administered Continue gentle IV fluid hydration normal saline at 50 cc/h Patient takes propranolol for cervical dystonia Hold propranolol for now Maintain map greater than 65  continue to closely monitor vital signs  Resolved hypokalemia post repletion  Potassium 4.1 on 10/18/2018 from 3.4   CKD 3 Creatinine appears to be at baseline 1.3 with GFR of 38 Avoid nephrotoxins Continue gentle IV fluid hydration Monitor urine output BMP in the morning  Acute blood loss anemia/iron deficiency anemia Suspected vagina bleed from rectovaginal fistula Iron studies with indication for deficiency Start ferrous sulfate 325 mg daily Hemoglobin stable at 11.0 from 12.5 yesterday Continue to monitor H&H  Dementia  From SNF/memory care unit Continue home medications Reorient as necessary Fall precautions  Chronic depression/anxiety Continue Klonopin 0.5 mg twice daily Continue Lexapro 10 mg nightly  History of cervical dystonia Follows up with neurology Hold off propranolol for now due to hypotension  Physical debility/ambulatory dysfunction PT assessment recommended SNF Recommended rolling walker with 5 inches wheels CSW consulted to assist with disposition. Continue fall precautions   DVT prophylaxis: Lovenox subcu  daily Code Status:Full Family Communication: Plan discussed with patient.  Daughter [name]- [phone].    Disposition Plan:  Likely discharge to SNF memory facility once gynecology, general surgery and IR sign off.   Consults called: GYN , general surgery, IR    Objective: Vitals:   10/17/18 2116 10/18/18 0542 10/18/18 0912 10/18/18 0916  BP: 122/74 (!) 109/55 (!) 99/44 (!) 95/47  Pulse: (!) 58 (!) 58 (!) 53 (!) 53  Resp: 20 20    Temp: 98.3 F (36.8 C)     TempSrc:      SpO2: 95% 95% 97% 97%  Weight:      Height:        Intake/Output Summary (Last 24 hours) at 10/18/2018 0933 Last data filed at 10/18/2018 0600 Gross per 24 hour  Intake 1921.07 ml  Output -  Net 1921.07 ml   Filed Weights   10/16/18 1458  Weight: 86.2 kg    Exam:  . General: 73 y.o. year-old female well-developed well-nourished pleasantly demented.  Alert and interactive.   . Cardiovascular: Regular rate and rhythm no rubs or gallops no JVD or thyromegaly. Marland Kitchen Respiratory: Clear to auscultation no wheezes or rales.  Poor inspiratory effort. . Abdomen: Soft nontender nondistended normal bowel sounds present.  . Musculoskeletal: No lower extremity edema.  2 out of 4 pulses in all 4 extremities. . GU: Serosanguineous discharge noted from vagina. . Psychiatry: Mood is appropriate for condition and setting.   Data Reviewed: CBC: Recent Labs  Lab 10/16/18 1532 10/16/18 2107 10/17/18 0649 10/18/18 0640  WBC 7.7 9.8 7.5 7.6  NEUTROABS  --   --   --  5.2  HGB 11.5* 12.5 11.0* 11.0*  HCT 36.0 38.2 34.7* 34.4*  MCV 93.8 92.9 94.0 94.0  PLT 352 353 311 [id number]   Basic Metabolic Panel: Recent Labs  Lab 10/16/18 1532 10/16/18 2107 10/17/18 0649 10/18/18 0640  NA 139  --  140 137  K 3.4*  --  3.4* 4.1  CL 105  --  108 107  CO2 23  --  22 21*  GLUCOSE 95  --  91 98  BUN 15  --  13 16  CREATININE 1.56* 1.40* 1.37* 1.30*  CALCIUM 8.8*  --  8.6* 9.1  MG  --   --   --  1.8  PHOS  --   --   --  4.2   GFR: Estimated Creatinine Clearance: 42.1 mL/min (A) (by C-G formula  based on SCr of 1.3 mg/dL (H)). Liver Function Tests: Recent Labs  Lab 10/18/18 0640  AST 12*  ALT 9  ALKPHOS 60  BILITOT 0.4  PROT 5.5*  ALBUMIN 3.3*   No results for input(s): LIPASE, AMYLASE in the last 168 hours. No results for input(s): AMMONIA in the last 168 hours. Coagulation Profile: No results for input(s): INR, PROTIME in the last 168 hours. Cardiac Enzymes: No results for input(s): CKTOTAL, CKMB, CKMBINDEX, TROPONINI in the last 168 hours. BNP (last 3 results) No results for input(s): PROBNP in the last 8760 hours. HbA1C: No results for input(s): HGBA1C in the last 72 hours. CBG: No results for input(s): GLUCAP in the last 168 hours. Lipid Profile: No results for input(s): CHOL, HDL, LDLCALC, TRIG, CHOLHDL, LDLDIRECT in the last 72 hours. Thyroid Function Tests: No results for input(s): TSH, T4TOTAL, FREET4, T3FREE, THYROIDAB in the last 72 hours. Anemia Panel: Recent Labs    10/17/18 0649  FERRITIN 148  TIBC 212*  IRON 26*   Urine  analysis:    Component Value Date/Time   COLORURINE RED (A) 10/16/2018 1636   APPEARANCEUR CLOUDY (A) 10/16/2018 1636   LABSPEC 1.023 10/16/2018 1636   PHURINE 5.0 10/16/2018 1636   GLUCOSEU NEGATIVE 10/16/2018 1636   HGBUR LARGE (A) 10/16/2018 1636   BILIRUBINUR NEGATIVE 10/16/2018 1636   BILIRUBINUR negative 04/18/2013 1223   KETONESUR 5 (A) 10/16/2018 1636   PROTEINUR 100 (A) 10/16/2018 1636   UROBILINOGEN negative 04/18/2013 1223   UROBILINOGEN 1.0 01/31/2008 0717   NITRITE NEGATIVE 10/16/2018 1636   LEUKOCYTESUR LARGE (A) 10/16/2018 1636   Sepsis Labs: @LABRCNTIP (procalcitonin:4,lacticidven:4)  ) Recent Results (from the past 240 hour(s))  Urine culture     Status: None   Collection Time: 10/16/18  7:26 PM   Specimen: Urine, Random  Result Value Ref Range Status   Specimen Description   Final    URINE, RANDOM Performed at [facility] [address], [place], [place] [address]     Special Requests   Final    NONE Performed at [facility], [place] [address], [place], [place] [address]    Culture   Final    Multiple bacterial morphotypes present, none predominant. Suggest appropriate recollection if clinically indicated.   Report Status 10/18/2018 FINAL  Final  SARS Coronavirus 2 [facility] order, Performed in [facility] hospital lab) Nasopharyngeal Nasopharyngeal Swab     Status: None   Collection Time: 10/16/18  9:07 PM   Specimen: Nasopharyngeal Swab  Result Value Ref Range Status   SARS Coronavirus 2 NEGATIVE NEGATIVE Final    Comment: (NOTE) If result is NEGATIVE SARS-CoV-2 target nucleic acids are NOT DETECTED. The SARS-CoV-2 RNA is generally detectable in upper and lower  respiratory specimens during the acute phase of infection. The lowest  concentration of SARS-CoV-2 viral copies this assay can detect is 250  copies / mL. A negative result does not preclude SARS-CoV-2 infection  and should not be used as the sole basis for treatment or other  patient management decisions.  A negative result may occur with  improper specimen collection / handling, submission of specimen other  than nasopharyngeal swab, presence of viral mutation(s) within the  areas targeted by this assay, and inadequate number of viral copies  (<250 copies / mL). A negative result must be combined with clinical  observations, patient history, and epidemiological information. If result is POSITIVE SARS-CoV-2 target nucleic acids are DETECTED. The SARS-CoV-2 RNA is generally detectable in upper and lower  respiratory specimens dur ing the acute phase of infection.  Positive  results are indicative of active infection with SARS-CoV-2.  Clinical  correlation with patient history and other diagnostic information is  necessary to determine patient infection status.  Positive results do  not rule out bacterial infection or co-infection with other viruses. If result is  PRESUMPTIVE POSTIVE SARS-CoV-2 nucleic acids MAY BE PRESENT.   A presumptive positive result was obtained on the submitted specimen  and confirmed on repeat testing.  While 2019 novel coronavirus  (SARS-CoV-2) nucleic acids may be present in the submitted sample  additional confirmatory testing may be necessary for epidemiological  and / or clinical management purposes  to differentiate between  SARS-CoV-2 and other Sarbecovirus currently known to infect humans.  If clinically indicated additional testing with an alternate test  methodology [phone]) is advised. The SARS-CoV-2 RNA is generally  detectable in upper and lower respiratory sp ecimens during the acute  phase of infection. The expected result is Negative. Fact Sheet  for Patients:  StrictlyIdeas.no Fact Sheet for Healthcare Providers: BankingDealers.co.za This test is not yet approved or cleared by the [place] FDA and has been authorized for detection and/or diagnosis of SARS-CoV-2 by FDA under an Emergency Use Authorization (EUA).  This EUA will remain in effect (meaning this test can be used) for the duration of the COVID-19 declaration under Section 564(b)(1) of the Act, 21 U.S.C. section 360bbb-3(b)(1), unless the authorization is terminated or revoked sooner. Performed at [facility], [place] [address], [place], [place] [address]       Studies: No results found.  Scheduled Meds: . atorvastatin  10 mg Oral QPM  . clonazePAM  0.5 mg Oral BID  . donepezil  10 mg Oral QHS  . enoxaparin (LOVENOX) injection  40 mg Subcutaneous QHS  . escitalopram  10 mg Oral QHS  . ferrous sulfate  325 mg Oral Q breakfast  . fesoterodine  4 mg Oral Daily  . Melatonin  5 mg Oral QHS  . memantine  7 mg Oral Daily  . nystatin-triamcinolone  1 application Topical TID  . pantoprazole  40 mg Oral Daily  . primidone  50 mg Oral Daily  . [START ON 10/20/2018]  propranolol ER  60 mg Oral Daily  . traZODone  50 mg Oral QHS    Continuous Infusions: . sodium chloride 50 mL/hr at 10/18/18 0912  . cefTRIAXone (ROCEPHIN)  IV 1 g (10/17/18 2138)     LOS: 1 day     Kayleen Memos, MD [facility] Pager [phone]  If 7PM-7AM, please contact night-coverage [email or web address] Password [facility] 10/18/2018, 9:33 AM

## 2018-10-18 NOTE — NC FL2 (Signed)
[place] LEVEL OF CARE SCREENING TOOL     IDENTIFICATION  Patient Name: Alyssa Macdonald Birthdate: May 06, 1945 Sex: female Admission Date (Current Location): 10/16/2018  [facility] and [place] Number:  Herbalist and Address:  [facility],  [place], [place]      Provider Number: [id number]  Attending Physician Name and Address:  Kayleen Memos, DO  Relative Name and Phone Number:  [phone] Jillaine Hamada    Current Level of Care: Hospital Recommended Level of Care: [place] Prior Approval Number:    Date Approved/Denied:   PASRR Number: [id number]  Discharge Plan: SNF    Current Diagnoses: Patient Active Problem List   Diagnosis Date Noted  . Anemia   . 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 Encounter for medical clearance for patient hold   . History of dementia   . Acute kidney injury ([place]) 06/10/2017  . Essential tremor 06/10/2017  . E. coli UTI (urinary tract infection) 06/05/2017  . Depression 06/05/2017  . UTI (urinary tract infection) 05/25/2017  . Acute metabolic encephalopathy [id number]  . Essential hypertension 05/25/2017  . Anxiety 05/25/2017  . Dementia ([place]) 05/25/2017  . Hypokalemia 05/25/2017  . Dizziness 05/25/2017  . Encephalopathy 05/25/2017  . 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 Abnormal urine odor 06/18/2013  . Foul smelling urine 06/18/2013  . Vaginitis and vulvovaginitis, unspecified 06/18/2013  . Need for prophylactic vaccination and inoculation against influenza 12/11/2012  . Knee pain, chronic 12/11/2012  . GERD (gastroesophageal reflux disease) 12/11/2012  . Other and unspecified hyperlipidemia 12/11/2012  . Stress due to illness of family member 11/05/2012  . 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 Obesity (BMI 30-39.9) 10/11/2012  . Tremors of nervous system 09/10/2012  . Other malaise and fatigue 09/10/2012  . Leg cramps 09/10/2012  .  Osteopenia 09/10/2012    Orientation RESPIRATION BLADDER Height & Weight     Self, Place, Situation  Normal Incontinent Weight: 190 lb (86.2 kg) Height:  5\' 7"  (170.2 cm)  BEHAVIORAL SYMPTOMS/MOOD NEUROLOGICAL BOWEL NUTRITION STATUS      Incontinent Diet(heart healthy)  AMBULATORY STATUS COMMUNICATION OF NEEDS Skin   Limited Assist Verbally Normal                       Personal Care Assistance Level of Assistance  Bathing, Feeding, Dressing Bathing Assistance: Limited assistance Feeding assistance: Independent Dressing Assistance: Limited assistance     Functional Limitations Info  Sight, Hearing, Speech Sight Info: Adequate Hearing Info: Adequate Speech Info: Adequate    SPECIAL CARE FACTORS FREQUENCY  PT (By licensed PT), OT (By licensed OT)     PT Frequency: 5x OT Frequency: 5x            Contractures Contractures Info: Not present    Additional Factors Info  Code Status, Allergies Code Status Info: full code Allergies Info: zocor           Current Medications (10/18/2018):  This is the current hospital active medication list Current Facility-Administered Medications  Medication Dose Route Frequency Provider Last Rate Last Dose  . 0.9 %  sodium chloride infusion   Intravenous Continuous Tu, Ching T, DO 50 mL/hr at 10/18/18 0912    . acetaminophen (TYLENOL) tablet 650 mg  650 mg Oral Q4H PRN Tu, Ching T, DO   650 mg at 10/17/18 1202  . atorvastatin (LIPITOR) tablet 10 mg  10 mg Oral QPM Tu, Ching T, DO  10 mg at 10/17/18 1706  . cefTRIAXone (ROCEPHIN) 1 g in sodium chloride 0.9 % 100 mL IVPB  1 g Intravenous Q24H Tu, Ching T, DO 200 mL/hr at 10/17/18 2138 1 g at 10/17/18 2138  . clonazePAM (KLONOPIN) tablet 0.5 mg  0.5 mg Oral BID Tu, Ching T, DO   0.5 mg at 10/18/18 0906  . donepezil (ARICEPT) tablet 10 mg  10 mg Oral QHS Tu, Ching T, DO   10 mg at 10/17/18 2134  . enoxaparin (LOVENOX) injection 40 mg  40 mg Subcutaneous QHS Tu, Ching T, DO   40 mg at  10/17/18 2135  . escitalopram (LEXAPRO) tablet 10 mg  10 mg Oral QHS Tu, Ching T, DO   10 mg at 10/17/18 2134  . ferrous sulfate tablet 325 mg  325 mg Oral Q breakfast Irene Pap N, DO   325 mg at 10/18/18 0747  . fesoterodine (TOVIAZ) tablet 4 mg  4 mg Oral Daily Tu, Ching T, DO   4 mg at 10/18/18 0908  . Melatonin TABS 5 mg  5 mg Oral QHS Tu, Ching T, DO   5 mg at 10/17/18 2133  . memantine (NAMENDA XR) 24 hr capsule 7 mg  7 mg Oral Daily Tu, Ching T, DO   7 mg at 10/18/18 0906  . nystatin-triamcinolone (MYCOLOG II) cream 1 application  1 application Topical TID Tu, Ching T, DO   1 application at [id number]  . pantoprazole (PROTONIX) EC tablet 40 mg  40 mg Oral Daily Tu, Ching T, DO   40 mg at 10/18/18 0906  . primidone (MYSOLINE) tablet 50 mg  50 mg Oral Daily Tu, Ching T, DO   50 mg at 10/18/18 0908  . [START ON 10/20/2018] propranolol ER (INDERAL LA) 24 hr capsule 60 mg  60 mg Oral Daily Hall, Carole N, DO      . traZODone (DESYREL) tablet 50 mg  50 mg Oral QHS Tu, Ching T, DO   50 mg at 10/17/18 2134     Discharge Medications: Please see discharge summary for a list of discharge medications.  Relevant Imaging Results:  Relevant Lab Results:   Additional Information [phone]  Nila Nephew, LCSW

## 2018-10-18 NOTE — TOC Initial Note (Signed)
Transition of Care [facility]) - Initial/Assessment Note    Patient Details  Name: Alyssa Macdonald MRN: [id number] Date of Birth: Dec 01, 1945  Transition of [facility]) CM/SW Contact:    Nila Nephew, LCSW Phone Number: [phone] 10/18/2018, 10:11 AM  Clinical Narrative:   Pt admitted with rectovaginal fistula with intractable burning pain in vagina, UTI. Possible work up pending for vaginal mass (dx in August, was to have OP work up this week). Pt has Alzheimer's and lives in [facility] memory care- her caregivers there report she is generally independent with ADLs and mobility, needs prompting only. Oriented to person, place, and mostly to situation at baseline.  Her son and daughters report that in the past, after UTI, pt has become weaker and more disoriented than normal, and went to SNF for rehab in this situation in 2019. At this point, caregivers and children in agreement to pursue SNF for short term rehab, pending insurance authorization. CSW made referrals and will follow up with bed offers.                Expected Discharge Plan: Skilled Nursing Facility Barriers to Discharge: Continued Medical Work up, SNF Pending bed offer, Insurance Authorization   Patient Goals and CMS Choice Patient states their goals for this hospitalization and ongoing recovery are:: son: "she has done well in rehab last year, would be good to have again before she goes back to [employer].gov Compare Post Acute Care list provided to:: Other (Comment Required)(son) Choice offered to / list presented to : Adult Children  Expected Discharge Plan and Services Expected Discharge Plan: [place] In-house Referral: Clinical Social Work   Post Acute Care Choice: [place] Living arrangements for the past 2 months: Assisted Living Facility(memory care)                                      Prior Living Arrangements/Services Living arrangements  for the past 2 months: Assisted Living Facility(memory care) Lives with:: Facility Resident Patient language and need for interpreter reviewed:: No Do you feel safe going back to the place where you live?: Yes      Need for Family Participation in Patient Care: Yes (Comment)(son and daughter) Care giver support system in place?: Yes (comment)(ALF, family)   Criminal Activity/Legal Involvement Pertinent to Current Situation/Hospitalization: No - Comment as needed  Activities of Daily Living Home Assistive Devices/Equipment: None ADL Screening (condition at time of admission) Patient's cognitive ability adequate to safely complete daily activities?: No Is the patient deaf or have difficulty hearing?: No Does the patient have difficulty seeing, even when wearing glasses/contacts?: No Does the patient have difficulty concentrating, remembering, or making decisions?: No Patient able to express need for assistance with ADLs?: Yes Does the patient have difficulty dressing or bathing?: No Independently performs ADLs?: Yes (appropriate for developmental age) Does the patient have difficulty walking or climbing stairs?: No Weakness of Legs: None Weakness of Arms/Hands: None  Permission Sought/Granted Permission sought to share information with : Facility Sport and exercise psychologist, Family Supports    Share Information with NAME: [phone] son Legrand Como [id number] daughter [name]  Permission granted to share info w AGENCY: [facility] [phone]        Emotional Assessment Appearance:: Appears stated age     Orientation: : Oriented to Self, Oriented to Place Alcohol / Substance Use: Not Applicable Psych Involvement: No (  comment)  Admission diagnosis:  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 Anemia   . Rectovaginal fistula  10/16/2018  . Encounter for medical clearance for patient hold   . History of dementia   . Acute kidney injury ([place]) 06/10/2017  . Essential tremor 06/10/2017  . E. coli UTI (urinary tract infection) 06/05/2017  . Depression 06/05/2017  . UTI (urinary tract infection) 05/25/2017  . Acute metabolic encephalopathy [id number]  . Essential hypertension 05/25/2017  . Anxiety 05/25/2017  . Dementia ([place]) 05/25/2017  . Hypokalemia 05/25/2017  . Dizziness 05/25/2017  . Encephalopathy 05/25/2017  . 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 Abnormal urine odor 06/18/2013  . Foul smelling urine 06/18/2013  . Vaginitis and vulvovaginitis, unspecified 06/18/2013  . Need for prophylactic vaccination and inoculation against influenza 12/11/2012  . Knee pain, chronic 12/11/2012  . GERD (gastroesophageal reflux disease) 12/11/2012  . Other and unspecified hyperlipidemia 12/11/2012  . Stress due to illness of family member 11/05/2012  . 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 Obesity (BMI 30-39.9) 10/11/2012  . Tremors of nervous system 09/10/2012  . Other malaise and fatigue 09/10/2012  . Leg cramps 09/10/2012  . Osteopenia 09/10/2012   PCP:  Bartholome Bill, MD Pharmacy:   [facility] [address] [place] [address] [place] [address] Phone: [phone] Fax: [phone]     Social Determinants of Health (SDOH) Interventions    Readmission Risk Interventions No flowsheet data found.

## 2018-10-18 NOTE — Consult Note (Addendum)
[facility] Surgery Consult/Admission Note  Alyssa Macdonald 01-08-1946  CJ:[id number].    Requesting Provider: Dr. 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  Patient has a history of cholecystectomy, appendectomy, and total hysterectomy.  Review of EMR states hx of partial colectomy and patient has a midline scar although I was unable to find any records of this.  Patient is unsure if she had a partial colectomy.  History is difficult to ascertain as patient is demented.  She states only recent onset of vaginal irritation.  Recent ED visit for vaginal bleeding on 8/21 and for vaginal leakage of stool on 9/8.    CT scan on 8/21 showed right adnexal mass, contiguous with vaginal cuff, obstructing right ureter with severe hydronephrosis. Pelvic bowel loops in the right adnexa are tethered and contiguous with and cannot be delineated from ill-defined adnexal mass.   Currently patient is only complaining of vaginal burning.  She denies abdominal pain or other symptoms.  She denies dysuria.  No family at bedside  ROS:  Review of Systems  Unable to perform ROS: Dementia     Family History  Problem Relation Age of Onset  . CVA Mother   . Alzheimer's disease Mother   . Diabetes Father   . Heart disease Father   . Parkinson's disease Paternal Uncle   . Cataracts Sister   . Breast cancer Neg Hx     Past Medical History:  Diagnosis Date  . Alzheimer's disease ([place])   . Cervical dystonia   . Complication of anesthesia    slow to awaken  . Depression   . Endometriosis    s/p total hysterectomy  . Esophageal reflux   . Essential and other specified forms of tremor   . GERD (gastroesophageal reflux disease)   . Hyperlipidemia   . Hypertension   . Hypopotassemia   . Osteoarthrosis  involving, or with mention of more than one site, but not specified as generalized, site unspecified(715.80)   . Osteoarthrosis involving, or with mention of more than one site, but not specified as generalized, site unspecified(715.80)   . 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 Tremors of nervous system   . Trigger finger     Past Surgical History:  Procedure Laterality Date  . APPENDECTOMY    . CATARACT EXTRACTION Bilateral    OD 07/03/2015, OS 05/29/2015  . CHOLECYSTECTOMY    . COLON SURGERY     bowel resection  . FOOT SURGERY    . JOINT REPLACEMENT     bilateral  . TONSILLECTOMY    . TOTAL ABDOMINAL HYSTERECTOMY    . TOTAL KNEE ARTHROPLASTY     Bilateral  . TRIGGER FINGER RELEASE  02/01/2012   Procedure: RELEASE TRIGGER FINGER/A-1 PULLEY;  Surgeon: Jolyn Nap, MD;  Location: [facility];  Service: Orthopedics;  Laterality: Right;  RIGHT LONG FINGER  TRIGGER FINGER RELEASE  . WISDOM TOOTH EXTRACTION      Social History:  reports that she has never smoked. She has never used smokeless tobacco. She reports current alcohol use. She reports that she does not use drugs.  Allergies:  Allergies  Allergen Reactions  . Zocor [Simvastatin] Other (See Comments)    Myalgia    Facility-Administered Medications Prior to Admission  Medication Dose Route Frequency  Provider Last Rate Last Dose  . incobotulinumtoxinA (XEOMIN) 100 units injection 100 Units  100 Units Intramuscular Q90 days Marcial Pacas, MD   100 Units at 12/11/15 1827   Medications Prior to Admission  Medication Sig Dispense Refill  . acetaminophen (TYLENOL) 325 MG tablet Take 650 mg by mouth every 4 (four) hours as needed for mild pain or fever.    Marland Kitchen atorvastatin (LIPITOR) 10 MG tablet Take 10 mg by mouth every evening.    . clonazePAM (KLONOPIN) 0.5 MG tablet Take 1 tablet (0.5 mg total) by mouth daily. (Patient taking differently: Take 0.5 mg by mouth 2 (two) times daily. ) 10 tablet 0  .  donepezil (ARICEPT) 10 MG tablet Take 1 tablet (10 mg total) by mouth at bedtime. 90 tablet 3  . escitalopram (LEXAPRO) 10 MG tablet Take 10 mg by mouth at bedtime.    . Melatonin 5 MG TABS Take 5 mg by mouth at bedtime.    . meloxicam (MOBIC) 15 MG tablet Take 15 mg by mouth daily.    . memantine (NAMENDA XR) 7 MG CP24 24 hr capsule Take 7 mg by mouth daily.     Marland Kitchen nystatin-triamcinolone (MYCOLOG II) cream Apply 1 application topically 3 (three) times daily.    Marland Kitchen omeprazole (PRILOSEC) 10 MG capsule Take 10 mg by mouth daily.    . primidone (MYSOLINE) 50 MG tablet Take 50 mg by mouth daily.    . propranolol ER (INDERAL LA) 60 MG 24 hr capsule TAKE ONE CAPSULE BY MOUTH ONE TIME DAILY (Patient taking differently: Take 60 mg by mouth daily. ) 90 capsule 4  . tolterodine (DETROL LA) 2 MG 24 hr capsule Take 2 mg by mouth daily.    . traZODone (DESYREL) 50 MG tablet Take 50 mg by mouth at bedtime.     . cephALEXin (KEFLEX) 500 MG capsule Take 1 capsule (500 mg total) by mouth 4 (four) times daily. (Patient not taking: Reported on 09/23/2018) 20 capsule 0  . nystatin cream (MYCOSTATIN) Apply to affected area 2 times daily (Patient not taking: Reported on 10/16/2018) 30 g 0    Blood pressure (!) 95/47, pulse (!) 53, temperature 98.3 F (36.8 C), resp. rate 20, height 5\' 7"  (1.702 m), weight 86.2 kg, SpO2 97 %.  Physical Exam Vitals signs and nursing note reviewed. Exam conducted with a chaperone present.  Constitutional:      General: She is not in acute distress.    Appearance: Normal appearance. She is overweight. She is not ill-appearing, toxic-appearing or diaphoretic.  HENT:     Head: Normocephalic and atraumatic.     Nose: Nose normal.     Mouth/Throat:     Lips: Pink.     Mouth: Mucous membranes are moist.     Pharynx: Oropharynx is clear.  Eyes:     General: No scleral icterus.       Right eye: No discharge.        Left eye: No discharge.     Conjunctiva/sclera: Conjunctivae normal.      Pupils: Pupils are equal, round, and reactive to light.  Neck:     Musculoskeletal: Normal range of motion and neck supple.  Cardiovascular:     Rate and Rhythm: Normal rate and regular rhythm.     Pulses:          Radial pulses are 2+ on the right side and 2+ on the left side.       Dorsalis pedis pulses are  2+ on the right side and 2+ on the left side.     Heart sounds: Normal heart sounds. No murmur.  Pulmonary:     Effort: Pulmonary effort is normal. No respiratory distress.     Breath sounds: Normal breath sounds. No wheezing, rhonchi or rales.  Abdominal:     General: Bowel sounds are normal. There is no distension.     Palpations: Abdomen is soft. Abdomen is not rigid.     Tenderness: There is no abdominal tenderness. There is no guarding.     Comments: Midline and transverse suprapubic scar noted.  Few scars from port sites appreciated.   Genitourinary:    Comments: Severe excoriation noted of external genitalia extending into groin.  Almost appears ulcerated with extensive erythema.  Stool noted to opening of vagina Musculoskeletal: Normal range of motion.        General: No tenderness or deformity.     Right lower leg: No edema.     Left lower leg: No edema.  Skin:    General: Skin is warm and dry.     Findings: No rash.  Neurological:     Mental Status: She is alert. She is confused.     Comments: Patient states she lives alone  Psychiatric:        Mood and Affect: Mood and affect normal.     Results for orders placed or performed during the hospital encounter of 10/16/18 (from the past 48 hour(s))  Basic metabolic panel     Status: Abnormal   Collection Time: 10/16/18  3:32 PM  Result Value Ref Range   Sodium 139 135 - 145 mmol/L   Potassium 3.4 (L) 3.5 - 5.1 mmol/L   Chloride 105 98 - 111 mmol/L   CO2 23 22 - 32 mmol/L   Glucose, Bld 95 70 - 99 mg/dL   BUN 15 8 - 23 mg/dL   Creatinine, Ser 1.56 (H) 0.44 - 1.00 mg/dL   Calcium 8.8 (L) 8.9 - 10.3 mg/dL   GFR  calc non Af Amer 32 (L) >60 mL/min   GFR calc Af Amer 37 (L) >60 mL/min   Anion gap 11 5 - 15    Comment: Performed at [facility], [place] [address], [place], [place] [address]  CBC     Status: Abnormal   Collection Time: 10/16/18  3:32 PM  Result Value Ref Range   WBC 7.7 4.0 - 10.5 K/uL   RBC 3.84 (L) 3.87 - 5.11 MIL/uL   Hemoglobin 11.5 (L) 12.0 - 15.0 g/dL   HCT 36.0 36.0 - 46.0 %   MCV 93.8 80.0 - 100.0 fL   MCH 29.9 26.0 - 34.0 pg   MCHC 31.9 30.0 - 36.0 g/dL   RDW 12.9 11.5 - 15.5 %   Platelets 352 150 - 400 K/uL   nRBC 0.0 0.0 - 0.2 %    Comment: Performed at [facility], [place] [address] [place], [place] [address]  Urinalysis, Routine w reflex microscopic     Status: Abnormal   Collection Time: 10/16/18  4:36 PM  Result Value Ref Range   Color, Urine RED (A) YELLOW   APPearance CLOUDY (A) CLEAR   Specific Gravity, Urine 1.023 1.005 - 1.030   pH 5.0 5.0 - 8.0   Glucose, UA NEGATIVE NEGATIVE mg/dL   Hgb urine dipstick LARGE (A) NEGATIVE   Bilirubin Urine NEGATIVE NEGATIVE   Ketones, ur 5 (A) NEGATIVE mg/dL   Protein, ur 100 (  A) NEGATIVE mg/dL   Nitrite NEGATIVE NEGATIVE   Leukocytes,Ua LARGE (A) NEGATIVE   RBC / HPF >50 (H) 0 - 5 RBC/hpf   WBC, UA >50 (H) 0 - 5 WBC/hpf   Bacteria, UA MANY (A) NONE SEEN   WBC Clumps PRESENT    Mucus PRESENT     Comment: Performed at [facility], [place] [address] [place], [place] [address]  Urine culture     Status: None   Collection Time: 10/16/18  7:26 PM   Specimen: Urine, Random  Result Value Ref Range   Specimen Description      URINE, RANDOM Performed at [place] [address], [place], [place] [address]    Special Requests      NONE Performed at [facility], [place] [address], [place], [place] [address]    Culture      Multiple bacterial morphotypes present, none predominant. Suggest appropriate recollection if clinically  indicated.   Report Status 10/18/2018 FINAL   SARS Coronavirus 2 [facility] order, Performed in [facility] hospital lab) Nasopharyngeal Nasopharyngeal Swab     Status: None   Collection Time: 10/16/18  9:07 PM   Specimen: Nasopharyngeal Swab  Result Value Ref Range   SARS Coronavirus 2 NEGATIVE NEGATIVE    Comment: (NOTE) If result is NEGATIVE SARS-CoV-2 target nucleic acids are NOT DETECTED. The SARS-CoV-2 RNA is generally detectable in upper and lower  respiratory specimens during the acute phase of infection. The lowest  concentration of SARS-CoV-2 viral copies this assay can detect is 250  copies / mL. A negative result does not preclude SARS-CoV-2 infection  and should not be used as the sole basis for treatment or other  patient management decisions.  A negative result may occur with  improper specimen collection / handling, submission of specimen other  than nasopharyngeal swab, presence of viral mutation(s) within the  areas targeted by this assay, and inadequate number of viral copies  (<250 copies / mL). A negative result must be combined with clinical  observations, patient history, and epidemiological information. If result is POSITIVE SARS-CoV-2 target nucleic acids are DETECTED. The SARS-CoV-2 RNA is generally detectable in upper and lower  respiratory specimens dur ing the acute phase of infection.  Positive  results are indicative of active infection with SARS-CoV-2.  Clinical  correlation with patient history and other diagnostic information is  necessary to determine patient infection status.  Positive results do  not rule out bacterial infection or co-infection with other viruses. If result is PRESUMPTIVE POSTIVE SARS-CoV-2 nucleic acids MAY BE PRESENT.   A presumptive positive result was obtained on the submitted specimen  and confirmed on repeat testing.  While 2019 novel coronavirus  (SARS-CoV-2) nucleic acids may be present in the submitted sample  additional  confirmatory testing may be necessary for epidemiological  and / or clinical management purposes  to differentiate between  SARS-CoV-2 and other Sarbecovirus currently known to infect humans.  If clinically indicated additional testing with an alternate test  methodology [phone]) is advised. The SARS-CoV-2 RNA is generally  detectable in upper and lower respiratory sp ecimens during the acute  phase of infection. The expected result is Negative. Fact Sheet for Patients:  StrictlyIdeas.no Fact Sheet for Healthcare Providers: BankingDealers.co.za This test is not yet approved or cleared by the [place] FDA and has been authorized for detection and/or diagnosis of SARS-CoV-2 by FDA under an Emergency Use Authorization (EUA).  This EUA will remain in  effect (meaning this test can be used) for the duration of the COVID-19 declaration under Section 564(b)(1) of the Act, 21 U.S.C. section 360bbb-3(b)(1), unless the authorization is terminated or revoked sooner. Performed at [facility], [place] [address] [place], [place] [address]   CBC     Status: None   Collection Time: 10/16/18  9:07 PM  Result Value Ref Range   WBC 9.8 4.0 - 10.5 K/uL   RBC 4.11 3.87 - 5.11 MIL/uL   Hemoglobin 12.5 12.0 - 15.0 g/dL   HCT 38.2 36.0 - 46.0 %   MCV 92.9 80.0 - 100.0 fL   MCH 30.4 26.0 - 34.0 pg   MCHC 32.7 30.0 - 36.0 g/dL   RDW 12.7 11.5 - 15.5 %   Platelets 353 150 - 400 K/uL   nRBC 0.0 0.0 - 0.2 %    Comment: Performed at [facility], [place] [address] [place], [place] [address]  Creatinine, serum     Status: Abnormal   Collection Time: 10/16/18  9:07 PM  Result Value Ref Range   Creatinine, Ser 1.40 (H) 0.44 - 1.00 mg/dL   GFR calc non Af Amer 37 (L) >60 mL/min   GFR calc Af Amer 42 (L) >60 mL/min    Comment: Performed at [facility], [place] [address] [place], [place] [address]  Iron and  TIBC     Status: Abnormal   Collection Time: 10/17/18  6:49 AM  Result Value Ref Range   Iron 26 (L) 28 - 170 ug/dL   TIBC 212 (L) 250 - 450 ug/dL   Saturation Ratios 12 10.4 - 31.8 %   UIBC 186 ug/dL    Comment: Performed at [facility], [place] [address], [place], [place] [address]  Ferritin     Status: None   Collection Time: 10/17/18  6:49 AM  Result Value Ref Range   Ferritin 148 11 - 307 ng/mL    Comment: Performed at [facility], [place] [address], [place], [place] [address]  CBC     Status: Abnormal   Collection Time: 10/17/18  6:49 AM  Result Value Ref Range   WBC 7.5 4.0 - 10.5 K/uL   RBC 3.69 (L) 3.87 - 5.11 MIL/uL   Hemoglobin 11.0 (L) 12.0 - 15.0 g/dL   HCT 34.7 (L) 36.0 - 46.0 %   MCV 94.0 80.0 - 100.0 fL   MCH 29.8 26.0 - 34.0 pg   MCHC 31.7 30.0 - 36.0 g/dL   RDW 12.7 11.5 - 15.5 %   Platelets 311 150 - 400 K/uL   nRBC 0.0 0.0 - 0.2 %    Comment: Performed at [facility], [place] [address], [place], [place] [id number]  Basic metabolic panel     Status: Abnormal   Collection Time: 10/17/18  6:49 AM  Result Value Ref Range   Sodium 140 135 - 145 mmol/L   Potassium 3.4 (L) 3.5 - 5.1 mmol/L   Chloride 108 98 - 111 mmol/L   CO2 22 22 - 32 mmol/L   Glucose, Bld 91 70 - 99 mg/dL   BUN 13 8 - 23 mg/dL   Creatinine, Ser 1.37 (H) 0.44 - 1.00 mg/dL   Calcium 8.6 (L) 8.9 - 10.3 mg/dL   GFR calc non Af Amer 38 (L) >60 mL/min   GFR calc Af Amer 44 (L) >60 mL/min   Anion gap 10 5 - 15    Comment: Performed  at [facility], [place] [address] [place], [place] [address]  CBC with Differential/Platelet     Status: Abnormal   Collection Time: 10/18/18  6:40 AM  Result Value Ref Range   WBC 7.6 4.0 - 10.5 K/uL   RBC 3.66 (L) 3.87 - 5.11 MIL/uL   Hemoglobin 11.0 (L) 12.0 - 15.0 g/dL   HCT 34.4 (L) 36.0 - 46.0 %   MCV 94.0 80.0 - 100.0 fL   MCH 30.1 26.0 - 34.0 pg   MCHC 32.0 30.0 - 36.0 g/dL   RDW 12.7  11.5 - 15.5 %   Platelets 314 150 - 400 K/uL   nRBC 0.0 0.0 - 0.2 %   Neutrophils Relative % 69 %   Neutro Abs 5.2 1.7 - 7.7 K/uL   Lymphocytes Relative 19 %   Lymphs Abs 1.5 0.7 - 4.0 K/uL   Monocytes Relative 9 %   Monocytes Absolute 0.7 0.1 - 1.0 K/uL   Eosinophils Relative 2 %   Eosinophils Absolute 0.2 0.0 - 0.5 K/uL   Basophils Relative 0 %   Basophils Absolute 0.0 0.0 - 0.1 K/uL   Immature Granulocytes 1 %   Abs Immature Granulocytes 0.04 0.00 - 0.07 K/uL    Comment: Performed at [facility], [place] [address] [place], [place] [address]  Comprehensive metabolic panel     Status: Abnormal   Collection Time: 10/18/18  6:40 AM  Result Value Ref Range   Sodium 137 135 - 145 mmol/L   Potassium 4.1 3.5 - 5.1 mmol/L    Comment: DELTA CHECK NOTED REPEATED TO VERIFY NO VISIBLE HEMOLYSIS    Chloride 107 98 - 111 mmol/L   CO2 21 (L) 22 - 32 mmol/L   Glucose, Bld 98 70 - 99 mg/dL   BUN 16 8 - 23 mg/dL   Creatinine, Ser 1.30 (H) 0.44 - 1.00 mg/dL   Calcium 9.1 8.9 - 10.3 mg/dL   Total Protein 5.5 (L) 6.5 - 8.1 g/dL   Albumin 3.3 (L) 3.5 - 5.0 g/dL   AST 12 (L) 15 - 41 U/L   ALT 9 0 - 44 U/L   Alkaline Phosphatase 60 38 - 126 U/L   Total Bilirubin 0.4 0.3 - 1.2 mg/dL   GFR calc non Af Amer 40 (L) >60 mL/min   GFR calc Af Amer 46 (L) >60 mL/min   Anion gap 9 5 - 15    Comment: Performed at [facility], [place] [address] [place], [place] [address]  Magnesium     Status: None   Collection Time: 10/18/18  6:40 AM  Result Value Ref Range   Magnesium 1.8 1.7 - 2.4 mg/dL    Comment: Performed at [facility], [place] [address], [place], [place] [address]  Phosphorus     Status: None   Collection Time: 10/18/18  6:40 AM  Result Value Ref Range   Phosphorus 4.2 2.5 - 4.6 mg/dL    Comment: Performed at [facility], [place] [address], [place], [place] [address]   No results found.    Assessment/Plan Active  Problems:   Rectovaginal fistula   Anemia  This is a 72 year old female who presented with possible rectovaginal fistula.  Based on CT scan findings, I suspect that this is a fistula between small bowel, the adnexal mass, and the vaginal cuff.  CT suggests pelvic bowel loops in the right adnexa are tethered and contiguous with and cannot be delineated from ill-defined  adnexal mass.  Adnexal mass contiguous with vaginal cuff.    Contrast enema or CT pelvis with rectal contrast may delineate rectal involvement.   Agree with GYN oncology consult.  Unsure at this time if this is a resectable mass especially if involving multiple loops of small bowel.  We will follow.  Kalman Drape, [facility] Surgery 10/18/2018, 10:40 AM Pager: [phone] Consults: [phone] Mon-Fri 7:00 am-4:30 pm Sat-Sun 7:00 am-11:30 am

## 2018-10-18 NOTE — Progress Notes (Signed)
Patient's MEWS is a 1 due to low BP. Dr. Nevada Crane notified and beta blocker held.   500 cc bolus given per Dr. Juel Burrow orders.   Will continue to monitor.

## 2018-10-18 NOTE — Consult Note (Addendum)
Consult Note: Gyn-Onc  Consult was requested by Dr. Nevada Crane for the evaluation of Alyssa Macdonald 73 y.o. female  Chief Complaint  Patient presents with  . Enterovaginal fistula    Assessment/Plan:  Alyssa Macdonald  is a 73 y.o.  year old with end stage alzheimer's dementia, a right pelvic mass and entero-vaginal fistula. She has a history of a total hysterectomy with BSO in 1990 for endometriosis.  History was obtained from both the patient and her son, Alyssa Macdonald, who has medical power of attorney.  The medical records were reviewed in entirety and her CT scan from August 2020 was reviewed including the images.  I suspect that Alyssa Macdonald has a malignancy in the right pelvis.  This does not appear consistent with an ovarian cancer primary given its appearance, retroperitoneal infiltration, and her history of a total hysterectomy with BSO.  She also has a history of bowel resection (possibly small bowel), though her son is unclear about this and does not know of a history of colon cancer. This may be a right-sided colonic tumor, appendiceal tumor, or metastatic implant.  I am recommending CT-guided biopsy of the mass for histologic confirmation of malignancy and subtyping.  The mass is causing complete or near complete right ureteral obstruction due to retroperitoneal infiltration.  It is also causing erosive infiltration into the right vagina and communication with what is likely to be small intestine.  I agree with the plan by general surgery to perform scans to interrogate the GI system to better document the site of fistula.  Treatment options are likely to be limited in this patient with advanced dementia.  I had a 45-minute discussion with the patient's son regarding goals of care and end-of-life planning.  He stated that his mother expressed to him before her dementia became advanced that she would not want to be resuscitated or placed on a ventilator.  He authorized a DO NOT  RESUSCITATE order to be placed which I have placed at this time.  His goals of care are palliative.  With respect to control of fistula symptoms, options are somewhat limited given her underlying dementia.  I would defer to general surgery regarding what they feel is best with respect to either diversion, bypass or symptom management with topical creams at the perineum and thighs.    If malignancy is confirmed, there might be a potential role for palliative treatment of the right pelvic mass with radiation, though I do not think chemotherapy or a radical surgical resection (which would involve likely right nephrectomy, partial cystectomy, en bloc resection of small and possibly large intestine and bowel diversion) would be appropriate in a patient with advancing dementia whose goals of care are palliative.  I will follow along pending the histologic confirmation of a gynecologic malignancy.  If none is identified I will defer to general surgery regarding ongoing management of the underlying malignancy and whether to perform a diverting or bypass procedure to control the fistula symptoms, or none.  HPI: Alyssa Macdonald is a 73 year old woman (P2) who is seen in consultation at the request of Dr Nevada Crane for evaluation of entero-vaginal fistula.   The patient is a limited historian and therefore history was received by the patient and her son, Alyssa Macdonald, who is medical power of attorney.  The patient began experiencing vaginal bleeding in June 2020 and presented to the emergency department where she was treated for a urinary tract infection.  The bleeding became somewhat worse and  included leakage of stool from the vagina through August and September 2020.  When the stool leakage caused increased excoriation of her perineum and thighs she was taken to the emergency room for admission.  CT scan of the abdomen and pelvis was performed on 09/23/18 which showed severe right hydronephrosis with associated severe cortical  thinning. There is also right ureteral dilatation to the pelvis at site of ill-defined pelvic mass. No perinephric edema. Absent excretion on delayed phase imaging. Parapelvic cyst in the left kidney without left hydronephrosis. Additional smaller cortical cysts in the left kidney. Urinary bladder is partially distended. Bowel evaluation is limited in the absence of enteric contrast. Tiny hiatal hernia. Stomach is decompressed. Proximal small bowel are unremarkable. Fluid-filled nondilated pelvic small bowel loops. Pelvic bowel loops in the right adnexa are tethered and contiguous with and cannot be delineated from ill-defined adnexal mass that is contiguous with the right aspect of the vaginal cuff. High-riding cecum in the right mid abdomen. Liquid stool in the proximal colon with formed stool distally. There is colonic tortuosity. Enteric sutures in the pelvis. Distal colonic diverticulosis without diverticulitis. Mild aortic atherosclerosis without aneurysm. Portal vein is patent. No enlarged abdominal or pelvic lymph nodes. Post hysterectomy. There is ill-defined soft tissue density contiguous with the  vaginal cuff extending 4.5 cm cranially. Right adnexal mass which is difficult to delineate from adjacent and contiguous bowel.  The patient's medical history is significant for a total abdominal hysterectomy with BSO performed in 1990 for endometriosis when she was in her mid to late 59s.  The medical record states that she has had prior colon resection, however the patient's son is unclear of this and is not aware of a colonic resection or history of colon cancer.  The patient is unable to provide any details about this.  She has had a cholecystectomy and appendectomy.  Her medical history is most significant for advanced Alzheimer's dementia.  She is somewhat independent with ADLs such as feeding herself, ambulation, and dressing.  However she requires maximal assistance with taking medications and meal  preparation.  She lives in a nursing home in a private room with a shared bathroom.  Her son Alyssa Macdonald is medical power of attorney and her daughter Alyssa Macdonald is financial power of attorney.  The patient stated her desires to be DNR to her son prior to development of severe Alzheimer's dementia.  Current Meds:  Current Facility-Administered Medications:  .  0.9 %  sodium chloride infusion, , Intravenous, Continuous, Tu, Ching T, DO, Last Rate: 50 mL/hr at 10/18/18 0912 .  acetaminophen (TYLENOL) tablet 650 mg, 650 mg, Oral, Q4H PRN, Tu, Ching T, DO, 650 mg at 10/17/18 1202 .  atorvastatin (LIPITOR) tablet 10 mg, 10 mg, Oral, QPM, Tu, Ching T, DO, 10 mg at 10/17/18 1706 .  cefTRIAXone (ROCEPHIN) 1 g in sodium chloride 0.9 % 100 mL IVPB, 1 g, Intravenous, Q24H, Tu, Ching T, DO, Last Rate: 200 mL/hr at 10/17/18 2138, 1 g at 10/17/18 2138 .  clonazePAM (KLONOPIN) tablet 0.5 mg, 0.5 mg, Oral, BID, Tu, Ching T, DO, 0.5 mg at 10/18/18 0906 .  donepezil (ARICEPT) tablet 10 mg, 10 mg, Oral, QHS, Tu, Ching T, DO, 10 mg at 10/17/18 2134 .  enoxaparin (LOVENOX) injection 40 mg, 40 mg, Subcutaneous, QHS, Tu, Ching T, DO, 40 mg at 10/17/18 2135 .  escitalopram (LEXAPRO) tablet 10 mg, 10 mg, Oral, QHS, Tu, Ching T, DO, 10 mg at 10/17/18 2134 .  ferrous sulfate tablet 325  mg, 325 mg, Oral, Q breakfast, Irene Pap N, DO, 325 mg at 10/18/18 0747 .  fesoterodine (TOVIAZ) tablet 4 mg, 4 mg, Oral, Daily, Tu, Ching T, DO, 4 mg at 10/18/18 0908 .  Melatonin TABS 5 mg, 5 mg, Oral, QHS, Tu, Ching T, DO, 5 mg at 10/17/18 2133 .  memantine (NAMENDA XR) 24 hr capsule 7 mg, 7 mg, Oral, Daily, Tu, Ching T, DO, 7 mg at 10/18/18 0906 .  nystatin-triamcinolone (MYCOLOG II) cream 1 application, 1 application, Topical, TID, Tu, Ching T, DO, 1 application at [id number] .  pantoprazole (PROTONIX) EC tablet 40 mg, 40 mg, Oral, Daily, Tu, Ching T, DO, 40 mg at 10/18/18 0906 .  primidone (MYSOLINE) tablet 50 mg, 50 mg, Oral, Daily, Tu,  Ching T, DO, 50 mg at 10/18/18 0908 .  [START ON 10/20/2018] propranolol ER (INDERAL LA) 24 hr capsule 60 mg, 60 mg, Oral, Daily, Hall, Carole N, DO .  traZODone (DESYREL) tablet 50 mg, 50 mg, Oral, QHS, Tu, Ching T, DO, 50 mg at 10/17/18 2134   Allergy:  Allergies  Allergen Reactions  . Zocor [Simvastatin] Other (See Comments)    Myalgia    Social Hx:   Social History   Socioeconomic History  . Marital status: Widowed    Spouse name: Ronalee Belts  . Number of children: 2  . Years of education: 36  . Highest education level: Not on file  Occupational History  . Occupation: RETIRED   Social Needs  . Financial resource strain: Not on file  . Food insecurity    Worry: Not on file    Inability: Not on file  . Transportation needs    Medical: Not on file    Non-medical: Not on file  Tobacco Use  . Smoking status: Never Smoker  . Smokeless tobacco: Never Used  Substance and Sexual Activity  . Alcohol use: Yes    Comment: Occasional,one glass of wine weekly  . Drug use: No  . Sexual activity: Not Currently    Partners: Male  Lifestyle  . Physical activity    Days per week: Not on file    Minutes per session: Not on file  . Stress: Not on file  Relationships  . Social Herbalist on phone: Not on file    Gets together: Not on file    Attends religious service: Not on file    Active member of club or organization: Not on file    Attends meetings of clubs or organizations: Not on file    Relationship status: Not on file  . Intimate partner violence    Fear of current or ex partner: Not on file    Emotionally abused: Not on file    Physically abused: Not on file    Forced sexual activity: Not on file  Other Topics Concern  . Not on file  Social History Narrative   Marital Status:Widowed Ronalee Belts)    Children:  Son Clare Gandy) Daughter Alyssa Macdonald)    Pets: Dog Terri Piedra)   Living Situation: Lives alone   Occupation: Retired     Education: Geophysicist/field seismologist in [place] Use/Exposure:  None    Alcohol Use:  Occasional 1-2 per week   Drug Use:  None   Diet:  Regular   Exercise:  None   Hobbies: Reading, Quilting   Patient is right handed  Past Surgical Hx:  Past Surgical History:  Procedure Laterality Date  . APPENDECTOMY    . CATARACT EXTRACTION Bilateral    OD 07/03/2015, OS 05/29/2015  . CHOLECYSTECTOMY    . COLON SURGERY     bowel resection  . FOOT SURGERY    . JOINT REPLACEMENT     bilateral  . TONSILLECTOMY    . TOTAL ABDOMINAL HYSTERECTOMY    . TOTAL KNEE ARTHROPLASTY     Bilateral  . TRIGGER FINGER RELEASE  02/01/2012   Procedure: RELEASE TRIGGER FINGER/A-1 PULLEY;  Surgeon: Jolyn Nap, MD;  Location: [facility];  Service: Orthopedics;  Laterality: Right;  RIGHT LONG FINGER  TRIGGER FINGER RELEASE  . WISDOM TOOTH EXTRACTION      Past Medical Hx:  Past Medical History:  Diagnosis Date  . Alzheimer's disease ([place])   . Cervical dystonia   . Complication of anesthesia    slow to awaken  . Depression   . Endometriosis    s/p total hysterectomy  . Esophageal reflux   . Essential and other specified forms of tremor   . GERD (gastroesophageal reflux disease)   . Hyperlipidemia   . Hypertension   . Hypopotassemia   . Osteoarthrosis involving, or with mention of more than one site, but not specified as generalized, site unspecified(715.80)   . Osteoarthrosis involving, or with mention of more than one site, but not specified as generalized, site unspecified(715.80)   . 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 Tremors of nervous system   . Trigger finger     Past Gynecological History:  SVD x 2, hysterectomy at age 59 (approx) with history of endometriosis  No LMP recorded. Patient has had a hysterectomy.  Family Hx:  Family History  Problem Relation Age of Onset  . CVA Mother   . Alzheimer's disease Mother   . Diabetes Father   . Heart disease Father   .  Parkinson's disease Paternal Uncle   . Cataracts Sister   . Breast cancer Neg Hx     Review of Systems:  + bleeding and leakage of stools from vagina  Vitals:  Blood pressure (!) 106/55, pulse (!) 57, temperature 98.3 F (36.8 C), resp. rate 20, height 5\' 7"  (1.702 m), weight 190 lb (86.2 kg), SpO2 97 %.  Physical Exam: WD in NAD Neck  Supple NROM, without any enlargements.  Lymph Node Survey No cervical supraclavicular or inguinal adenopathy Cardiovascular  Pulse normal rate, regularity and rhythm. S1 and S2 normal.  Lungs  Clear to auscultation bilateraly, without wheezes/crackles/rhonchi. Good air movement.  Skin  + excoriation and weeping from thighs and perineum.  Psychiatry  Alert, not oriented to time, oriented to place, oriented to person. Cooperative.  Abdomen  Normoactive bowel sounds, abdomen soft, non-tender and mildly obese without evidence of hernia. Vertical midline incision that extended above umbilicus.  Back No CVA tenderness Genito Urinary  Vulva/vagina: excoriation from bowel contents on skin, erythematous  Vagina: palpable fleshy mass/cavitation at apex of vagina    Rectal  deferred Extremities  No bilateral cyanosis, clubbing or edema.   Thereasa Solo, MD  10/18/2018, 4:46 PM

## 2018-10-19 ENCOUNTER — Inpatient Hospital Stay: Payer: Medicare Other | Admitting: Gynecologic Oncology

## 2018-10-19 ENCOUNTER — Inpatient Hospital Stay (HOSPITAL_COMMUNITY): Payer: Medicare Other

## 2018-10-19 ENCOUNTER — Encounter (HOSPITAL_COMMUNITY): Payer: Self-pay

## 2018-10-19 DIAGNOSIS — D509 Iron deficiency anemia, unspecified: Secondary | ICD-10-CM

## 2018-10-19 LAB — CBC WITH DIFFERENTIAL/PLATELET
Abs Immature Granulocytes: 0.03 10*3/uL (ref 0.00–0.07)
Basophils Absolute: 0 10*3/uL (ref 0.0–0.1)
Basophils Relative: 0 %
Eosinophils Absolute: 0.1 10*3/uL (ref 0.0–0.5)
Eosinophils Relative: 1 %
HCT: 34.7 % — ABNORMAL LOW (ref 36.0–46.0)
Hemoglobin: 11.2 g/dL — ABNORMAL LOW (ref 12.0–15.0)
Immature Granulocytes: 0 %
Lymphocytes Relative: 14 %
Lymphs Abs: 1.2 10*3/uL (ref 0.7–4.0)
MCH: 30.1 pg (ref 26.0–34.0)
MCHC: 32.3 g/dL (ref 30.0–36.0)
MCV: 93.3 fL (ref 80.0–100.0)
Monocytes Absolute: 0.7 10*3/uL (ref 0.1–1.0)
Monocytes Relative: 8 %
Neutro Abs: 6.4 10*3/uL (ref 1.7–7.7)
Neutrophils Relative %: 77 %
Platelets: 344 10*3/uL (ref 150–400)
RBC: 3.72 MIL/uL — ABNORMAL LOW (ref 3.87–5.11)
RDW: 12.7 % (ref 11.5–15.5)
WBC: 8.5 10*3/uL (ref 4.0–10.5)
nRBC: 0 % (ref 0.0–0.2)

## 2018-10-19 LAB — BASIC METABOLIC PANEL
Anion gap: 12 (ref 5–15)
BUN: 15 mg/dL (ref 8–23)
CO2: 20 mmol/L — ABNORMAL LOW (ref 22–32)
Calcium: 9.2 mg/dL (ref 8.9–10.3)
Chloride: 105 mmol/L (ref 98–111)
Creatinine, Ser: 1.25 mg/dL — ABNORMAL HIGH (ref 0.44–1.00)
GFR calc Af Amer: 49 mL/min — ABNORMAL LOW (ref >60)
GFR calc non Af Amer: 42 mL/min — ABNORMAL LOW (ref >60)
Glucose, Bld: 86 mg/dL (ref 70–99)
Potassium: 3.8 mmol/L (ref 3.5–5.1)
Sodium: 137 mmol/L (ref 135–145)

## 2018-10-19 MED ORDER — OXYCODONE-ACETAMINOPHEN 5-325 MG PO TABS
2.0000 | ORAL_TABLET | Freq: Once | ORAL | Status: AC
  Administered 2018-10-19: 2 via ORAL
  Filled 2018-10-19: qty 2

## 2018-10-19 MED ORDER — IOHEXOL 300 MG/ML  SOLN
100.0000 mL | Freq: Once | INTRAMUSCULAR | Status: AC | PRN
  Administered 2018-10-19: 15:00:00 80 mL via INTRAVENOUS

## 2018-10-19 MED ORDER — ENOXAPARIN SODIUM 40 MG/0.4ML ~~LOC~~ SOLN
40.0000 mg | Freq: Every day | SUBCUTANEOUS | Status: DC
  Administered 2018-10-20 – 2018-10-27 (×8): 40 mg via SUBCUTANEOUS
  Filled 2018-10-19 (×8): qty 0.4

## 2018-10-19 MED ORDER — OXYCODONE-ACETAMINOPHEN 5-325 MG PO TABS
1.0000 | ORAL_TABLET | Freq: Four times a day (QID) | ORAL | Status: DC | PRN
  Administered 2018-10-20: 1 via ORAL
  Administered 2018-10-21: 2 via ORAL
  Administered 2018-10-21 (×2): 1 via ORAL
  Administered 2018-10-22 (×2): 2 via ORAL
  Administered 2018-10-22: 1 via ORAL
  Administered 2018-10-23 – 2018-10-25 (×4): 2 via ORAL
  Administered 2018-10-25: 1 via ORAL
  Administered 2018-10-25 – 2018-10-27 (×6): 2 via ORAL
  Administered 2018-10-27: 1 via ORAL
  Administered 2018-10-28 (×2): 2 via ORAL
  Filled 2018-10-19: qty 1
  Filled 2018-10-19 (×4): qty 2
  Filled 2018-10-19: qty 1
  Filled 2018-10-19 (×4): qty 2
  Filled 2018-10-19: qty 1
  Filled 2018-10-19 (×2): qty 2
  Filled 2018-10-19: qty 1
  Filled 2018-10-19: qty 2
  Filled 2018-10-19: qty 1
  Filled 2018-10-19 (×5): qty 2

## 2018-10-19 MED ORDER — SODIUM CHLORIDE (PF) 0.9 % IJ SOLN
INTRAMUSCULAR | Status: AC
  Filled 2018-10-19: qty 50

## 2018-10-19 NOTE — Consult Note (Signed)
Chief Complaint: Patient was seen in consultation today for CT-guided biopsy of right adnexal/vaginal cuff mass Chief Complaint  Patient presents with   Vaginal Prolapse    Referring Physician(s): Hall,C/Rossi,E  Supervising Physician: Daryll Brod  Patient Status: [facility] - In-pt  History of Present Illness: Alyssa Macdonald is a 73 y.o. female with past medical history significant for Alzheimer's disease, cervical dystonia, CKD, depression, endometriosis with prior hysterectomy/BSO in 1990, GERD, hypertension, hyperlipidemia, tremors and enterovaginal fistula.  She has had a history of vaginal bleeding and leakage of stool from the vagina for the past few months.  Recent imaging has revealed ill-defined right adnexal mass which is contiguous with the right aspect of the vaginal cuff and difficult to delineate size due to adjacent bowel loops.  The lesion obstructs the right ureter with severe right hydroureteronephrosis and renal cortical thinning.  Also noted were multiple hepatic cyst with 10 mm hypodense lesion in the right dome too small to characterize, and mild colonic diverticulosis.  She has no prior history of cancer.  Request now received for image guided biopsy of the right adnexal/vaginal cuff mass.  Past Medical History:  Diagnosis Date   Alzheimer's disease ([place])    Cervical dystonia    Complication of anesthesia    slow to awaken   Depression    Endometriosis    s/p total hysterectomy   Esophageal reflux    Essential and other specified forms of tremor    GERD (gastroesophageal reflux disease)    Hyperlipidemia    Hypertension    Hypopotassemia    Osteoarthrosis involving, or with mention of more than one site, but not specified as generalized, site unspecified(715.80)    Osteoarthrosis involving, or with mention of more than one site, but not specified as generalized, site unspecified(715.80)    Other disorders of bone and cartilage(733.99)     Tremors of nervous system    Trigger finger     Past Surgical History:  Procedure Laterality Date   APPENDECTOMY     CATARACT EXTRACTION Bilateral    OD 07/03/2015, OS 05/29/2015   CHOLECYSTECTOMY     COLON SURGERY     bowel resection   FOOT SURGERY     JOINT REPLACEMENT     bilateral   TONSILLECTOMY     TOTAL ABDOMINAL HYSTERECTOMY     TOTAL KNEE ARTHROPLASTY     Bilateral   TRIGGER FINGER RELEASE  02/01/2012   Procedure: RELEASE TRIGGER FINGER/A-1 PULLEY;  Surgeon: Jolyn Nap, MD;  Location: [place];  Service: Orthopedics;  Laterality: Right;  RIGHT LONG FINGER  TRIGGER FINGER RELEASE   WISDOM TOOTH EXTRACTION      Allergies: Zocor [simvastatin]  Medications: Prior to Admission medications   Medication Sig Start Date End Date Taking? Authorizing Provider  acetaminophen (TYLENOL) 325 MG tablet Take 650 mg by mouth every 4 (four) hours as needed for mild pain or fever.   Yes [provider]  atorvastatin (LIPITOR) 10 MG tablet Take 10 mg by mouth every evening.   Yes [provider]  clonazePAM (KLONOPIN) 0.5 MG tablet Take 1 tablet (0.5 mg total) by mouth daily. Patient taking differently: Take 0.5 mg by mouth 2 (two) times daily.  05/28/17  Yes Arrien, Jimmy Picket, MD  donepezil (ARICEPT) 10 MG tablet Take 1 tablet (10 mg total) by mouth at bedtime. 02/15/17  Yes Star Age, MD  escitalopram (LEXAPRO) 10 MG tablet Take 10 mg by mouth at bedtime.  Yes [provider]  Melatonin 5 MG TABS Take 5 mg by mouth at bedtime.   Yes [provider]  meloxicam (MOBIC) 15 MG tablet Take 15 mg by mouth daily.   Yes [provider]  memantine (NAMENDA XR) 7 MG CP24 24 hr capsule Take 7 mg by mouth daily.  08/10/17  Yes [provider]  nystatin-triamcinolone (MYCOLOG II) cream Apply 1 application topically 3 (three) times daily.   Yes [provider]  omeprazole (PRILOSEC) 10 MG capsule  Take 10 mg by mouth daily.   Yes [provider]  primidone (MYSOLINE) 50 MG tablet Take 50 mg by mouth daily.   Yes [provider]  propranolol ER (INDERAL LA) 60 MG 24 hr capsule TAKE ONE CAPSULE BY MOUTH ONE TIME DAILY Patient taking differently: Take 60 mg by mouth daily.  08/13/15  Yes Star Age, MD  tolterodine (DETROL LA) 2 MG 24 hr capsule Take 2 mg by mouth daily.   Yes [provider]  traZODone (DESYREL) 50 MG tablet Take 50 mg by mouth at bedtime.  05/23/15  Yes [provider]  cephALEXin (KEFLEX) 500 MG capsule Take 1 capsule (500 mg total) by mouth 4 (four) times daily. Patient not taking: Reported on 09/23/2018 07/19/18   [name], DO  nystatin cream (MYCOSTATIN) Apply to affected area 2 times daily Patient not taking: Reported on 10/16/2018 10/11/18   Randal Buba, April, MD     Family History  Problem Relation Age of Onset   CVA Mother    Alzheimer's disease Mother    Diabetes Father    Heart disease Father    Parkinson's disease Paternal Uncle    Cataracts Sister    Breast cancer Neg Hx     Social History   Socioeconomic History   Marital status: Widowed    Spouse name: Ronalee Belts   Number of children: 2   Years of education: 14   Highest education level: Not on file  Occupational History   Occupation: RETIRED   Scientist, product/process development strain: Not on file   Food insecurity    Worry: Not on file    Inability: Not on file   Transportation needs    Medical: Not on file    Non-medical: Not on file  Tobacco Use   Smoking status: Never Smoker   Smokeless tobacco: Never Used  Substance and Sexual Activity   Alcohol use: Yes    Comment: Occasional,one glass of wine weekly   Drug use: No   Sexual activity: Not Currently    Partners: Male  Lifestyle   Physical activity    Days per week: Not on file    Minutes per session: Not on file   Stress: Not on file  Relationships   Social connections     Talks on phone: Not on file    Gets together: Not on file    Attends religious service: Not on file    Active member of club or organization: Not on file    Attends meetings of clubs or organizations: Not on file    Relationship status: Not on file  Other Topics Concern   Not on file  Social History Narrative   Marital Status:Widowed Ronalee Belts)    Children:  Son Clare Gandy) Daughter [name])    Pets: Dog Terri Piedra)   Living Situation: Lives alone   Occupation: Retired     Education: Geophysicist/field seismologist in [place] Use/Exposure:  None  Alcohol Use:  Occasional 1-2 per week   Drug Use:  None   Diet:  Regular   Exercise:  None   Hobbies: Reading, Quilting   Patient is right handed                        Review of Systems see above; currently denies fever, headache, chest pain, dyspnea, cough, back pain, nausea, vomiting.  Vital Signs: BP 118/68 (BP Location: Right Arm)    Pulse 68    Temp 98.2 F (36.8 C) (Oral)    Resp 19    Ht 5\' 7"  (1.702 m)    Wt 190 lb (86.2 kg)    SpO2 95%    BMI 29.76 kg/m   Physical Exam awake, answering basic questions okay.  Chest clear to auscultation bilaterally.  Heart with regular rate and rhythm.  Abdomen soft, positive bowel sounds, mild lower pelvic tenderness to palpation; no significant lower extremity edema.  Imaging: Ct Abdomen Pelvis W Contrast  Result Date: 09/23/2018 CLINICAL DATA:  73 year old with vaginal bleeding.  Abdominal pain. EXAM: CT ABDOMEN AND PELVIS WITH CONTRAST TECHNIQUE: Multidetector CT imaging of the abdomen and pelvis was performed using the standard protocol following bolus administration of intravenous contrast. CONTRAST:  35mL OMNIPAQUE IOHEXOL 300 MG/ML  SOLN COMPARISON:  None. FINDINGS: Lower chest: Linear atelectasis or scarring in both lower lobes. Trace right pleural thickening/minimal pleural fluid. Hepatobiliary: 16 mm cyst in the left lobe of the liver, 11 mm cyst in the left hepatic dome. No suspicious  hepatic lesion. Possible vague 10 mm lesion in the anterior right hepatic dome, image 9 series 2, does not represent cyst. Clips in the gallbladder fossa postcholecystectomy. No biliary dilatation. Pancreas: No ductal dilatation or inflammation. Spleen: Normal in size without focal abnormality. Splenule at the hilum. Adrenals/Urinary Tract: Subcentimeter left adrenal nodularity. Right adrenal gland is normal. Severe right hydronephrosis with associated severe cortical thinning. There is also right ureteral dilatation to the pelvis at site of ill-defined pelvic mass. No perinephric edema. Absent excretion on delayed phase imaging. Parapelvic cyst in the left kidney without left hydronephrosis. Additional smaller cortical cysts in the left kidney. Urinary bladder is partially distended. Stomach/Bowel: Bowel evaluation is limited in the absence of enteric contrast. Tiny hiatal hernia. Stomach is decompressed. Proximal small bowel are unremarkable. Fluid-filled nondilated pelvic small bowel loops. Pelvic bowel loops in the right adnexa are tethered and contiguous with and cannot be delineated from ill-defined adnexal mass that is contiguous with the right aspect of the vaginal cuff. High-riding cecum in the right mid abdomen. Liquid stool in the proximal colon with formed stool distally. There is colonic tortuosity. Enteric sutures in the pelvis. Distal colonic diverticulosis without diverticulitis. Vascular/Lymphatic: Mild aortic atherosclerosis without aneurysm. Portal vein is patent. No enlarged abdominal or pelvic lymph nodes. Reproductive: Post hysterectomy. There is ill-defined soft tissue density contiguous with the vaginal cuff extending 4.5 cm cranially. Right adnexal mass which is difficult to delineate from adjacent and contiguous bowel, series 2, image 61. Other: No significant free fluid.  No free air. Musculoskeletal: Hemangioma within T12 vertebral body. There are no acute or suspicious osseous  abnormalities. IMPRESSION: 1. Post hysterectomy. Ill-defined right adnexal mass which is contiguous with the right aspect of the vaginal cuff, difficult to delineate size due to adjacent bowel loops. This measures at least 4.5 cm in cranial caudal dimension. Adjacent small bowel loops tethered. This lesion obstructs the right ureter with severe right hydroureteronephrosis and  renal cortical thinning. 2. Multiple hepatic cysts. Additional vague 10 mm hypodense lesion in the right dome is too small to characterize. 3. Mild colonic diverticulosis without diverticulitis. 4.  Aortic Atherosclerosis (ICD10-I70.0). Electronically Signed   By: Keith Rake M.D.   On: 09/23/2018 22:04    Labs:  CBC: Recent Labs    10/16/18 2107 10/17/18 0649 10/18/18 0640 10/19/18 0607  WBC 9.8 7.5 7.6 8.5  HGB 12.5 11.0* 11.0* 11.2*  HCT 38.2 34.7* 34.4* 34.7*  PLT 353 311 314 344    COAGS: No results for input(s): INR, APTT in the last 8760 hours.  BMP: Recent Labs    10/16/18 1532 10/16/18 2107 10/17/18 0649 10/18/18 0640 10/19/18 0607  NA 139  --  140 137 137  K 3.4*  --  3.4* 4.1 3.8  CL 105  --  108 107 105  CO2 23  --  22 21* 20*  GLUCOSE 95  --  91 98 86  BUN 15  --  13 16 15   CALCIUM 8.8*  --  8.6* 9.1 9.2  CREATININE 1.56* 1.40* 1.37* 1.30* 1.25*  GFRNONAA 32* 37* 38* 40* 42*  GFRAA 37* 42* 44* 46* 49*    LIVER FUNCTION TESTS: Recent Labs    09/23/18 2027 10/18/18 0640  BILITOT 1.0 0.4  AST 14* 12*  ALT 12 9  ALKPHOS 79 60  PROT 6.7 5.5*  ALBUMIN 4.4 3.3*    TUMOR MARKERS: No results for input(s): AFPTM, CEA, CA199, CHROMGRNA in the last 8760 hours.  Assessment and Plan: 73 y.o. female with past medical history significant for Alzheimer's disease, cervical dystonia, CKD, depression, endometriosis with prior hysterectomy/BSO in 1990, GERD, hypertension, hyperlipidemia, tremors and enterovaginal fistula.  She has had a history of vaginal bleeding and leakage of stool from  the vagina for the past few months.  Recent imaging has revealed ill-defined right adnexal mass which is contiguous with the right aspect of the vaginal cuff and difficult to delineate size due to adjacent bowel loops.  The lesion obstructs the right ureter with severe right hydroureteronephrosis and renal cortical thinning.  Also noted were multiple hepatic cyst with 10 mm hypodense lesion in the right dome too small to characterize, and mild colonic diverticulosis.  She has no prior history of cancer.  Request now received for image guided biopsy of the right adnexal/vaginal cuff mass.  Imaging studies have been reviewed by Dr. Annamaria Boots. Risks and benefits of procedure was discussed with the patient and/or patient's family(son- Stevphen Meuse) including, but not limited to bleeding, infection, damage to adjacent structures or low yield requiring additional tests.  Current labs include WBC 8.5, hemoglobin 11.2, platelets 344k, creatinine 1.25, PT/INR pending  All of the questions were answered and there is agreement to proceed.  Consent signed and in chart.  Procedure scheduled for 9/17 a.m.  Thank you for this interesting consult.  I greatly enjoyed meeting Alyssa Macdonald and look forward to participating in their care.  A copy of this report was sent to the requesting provider on this date.  Electronically Signed: D. Rowe Robert, PA-C 10/19/2018, 9:31 AM   I spent a total of  30 minutes   in face to face in clinical consultation, greater than 50% of which was counseling/coordinating care for CT-guided biopsy of right adnexal/vaginal cuff mass

## 2018-10-19 NOTE — Progress Notes (Signed)
PROGRESS NOTE                                                                                                                                                                                                             Patient Demographics:    Alyssa Macdonald, is a 73 y.o. female, DOB - February 19, 1945, [id number]  Admit date - 10/16/2018   Admitting Physician Orene Desanctis, DO  Outpatient Primary MD for the patient is Bartholome Bill, MD  LOS - 2  Outpatient Specialists:  Chief Complaint  Patient presents with  . Vaginal Prolapse       Brief Narrative  73 year old female with history of Alzheimer's dementia, cervical dystonia, with recently diagnosed rectovaginal fistula presented with severe vaginal burning.  She was diagnosed with a new right adnexal mass in August 2020 that was contiguous with the right aspect of her vaginal cuff after she presented to the ED with concerns for vaginal bleeding.  She was discharged with plans on outpatient GYN oncology follow-up but she does not have an appointment until later this week. Patient denied any fevers, abdominal pain, shortness of breath, chills nausea, vomiting or diarrhea. In the ED blood work showed anemia (hemoglobin dropped to 11.5 from baseline 14.4).  Chemistry showed mild AKI (creatinine 1.56 from baseline of 1.2).  UA showed leukocyte and bacteria. GYN oncology and [facility] consulted.   Subjective:    C/O burning from vaginal discharge which is copious. afebrile   Assessment  & Plan :   Rectovaginal fistula with intractable vaginal pain Receiving as needed tramadol.  Seen by GYN oncology and recommended IR consult for biopsy given her pelvic mass. General surgery following and discussing with IR regarding imaging for the fistula. Scheduled for CT-guided biopsy of the right adnexal/vaginal cuff mass for tomorrow.  ?  UTI Receiving empiric Rocephin.  Hypotension  with bradycardia Improved with IV normal saline bolus followed by gentle hydration.  Propranolol helped (on it for cervical dystonia).  Hypokalemia Replenished  Acute blood loss/iron deficiency anemia Started on iron sulfate.  Hemoglobin currently stable.  Dementia?  Moderate Resident of SNF/memory care unit Home meds continued  Chronic depression with anxiety Continue Lexapro and Klonopin  History of cervical dystonia Being followed by neurology.  Propranolol held due to hypotension and bradycardia  Physical deconditioning PT recommends SNF with rolling  walker.    Code Status : DNR  Family Communication  : None  Disposition Plan  : Pending hospital course  Barriers For Discharge : Active symptoms  Consults  : Surgery/GYN oncology/IR  Procedures  : None/  DVT Prophylaxis  : Lovenox  Lab Results  Component Value Date   PLT 344 10/19/2018    Antibiotics  :   Anti-infectives (From admission, onward)   Start     Dose/Rate Route Frequency Ordered Stop   10/17/18 2200  cefTRIAXone (ROCEPHIN) 1 g in sodium chloride 0.9 % 100 mL IVPB     1 g 200 mL/hr over 30 Minutes Intravenous Every 24 hours 10/16/18 2046     10/16/18 2015  cefTRIAXone (ROCEPHIN) 1 g in sodium chloride 0.9 % 100 mL IVPB     1 g 200 mL/hr over 30 Minutes Intravenous  Once 10/16/18 2000 10/16/18 2157        Objective:   Vitals:   10/18/18 0916 10/18/18 1347 10/18/18 2041 10/19/18 0730  BP: (!) 95/47 (!) 106/55 (!) 143/68 118/68  Pulse: (!) 53 (!) 57 72 68  Resp:   20 19  Temp:   99.2 F (37.3 C) 98.2 F (36.8 C)  TempSrc:   Oral Oral  SpO2: 97%  91% 95%  Weight:      Height:        Wt Readings from Last 3 Encounters:  10/16/18 86.2 kg  08/17/17 81.8 kg  05/31/17 78.5 kg     Intake/Output Summary (Last 24 hours) at 10/19/2018 0918 Last data filed at 10/19/2018 0859 Gross per 24 hour  Intake 2499.33 ml  Output -  Net 2499.33 ml     Physical Exam  Gen: not in distress,  irritable HEENT: moist mucosa, supple neck Chest: clear b/l, no added sounds CVS: N S1&S2, no murmurs,  GI: soft, NT, ND, BS+, feculent copious vaginal discharge with erythem of the groun Musculoskeletal: warm, no edema CNS: alert and oriented, fine tremors    Data Review:    CBC Recent Labs  Lab 10/16/18 1532 10/16/18 2107 10/17/18 0649 10/18/18 0640 10/19/18 0607  WBC 7.7 9.8 7.5 7.6 8.5  HGB 11.5* 12.5 11.0* 11.0* 11.2*  HCT 36.0 38.2 34.7* 34.4* 34.7*  PLT 352 353 311 314 344  MCV 93.8 92.9 94.0 94.0 93.3  MCH 29.9 30.4 29.8 30.1 30.1  MCHC 31.9 32.7 31.7 32.0 32.3  RDW 12.9 12.7 12.7 12.7 12.7  LYMPHSABS  --   --   --  1.5 1.2  MONOABS  --   --   --  0.7 0.7  EOSABS  --   --   --  0.2 0.1  BASOSABS  --   --   --  0.0 0.0    Chemistries  Recent Labs  Lab 10/16/18 1532 10/16/18 2107 10/17/18 0649 10/18/18 0640 10/19/18 0607  NA 139  --  140 137 137  K 3.4*  --  3.4* 4.1 3.8  CL 105  --  108 107 105  CO2 23  --  22 21* 20*  GLUCOSE 95  --  91 98 86  BUN 15  --  13 16 15   CREATININE 1.56* 1.40* 1.37* 1.30* 1.25*  CALCIUM 8.8*  --  8.6* 9.1 9.2  MG  --   --   --  1.8  --   AST  --   --   --  12*  --   ALT  --   --   --  9  --   ALKPHOS  --   --   --  60  --   BILITOT  --   --   --  0.4  --    ------------------------------------------------------------------------------------------------------------------ No results for input(s): CHOL, HDL, LDLCALC, TRIG, CHOLHDL, LDLDIRECT in the last 72 hours.  No results found for: HGBA1C ------------------------------------------------------------------------------------------------------------------ No results for input(s): TSH, T4TOTAL, T3FREE, THYROIDAB in the last 72 hours.  Invalid input(s): FREET3 ------------------------------------------------------------------------------------------------------------------ Recent Labs    10/17/18 0649  FERRITIN 148  TIBC 212*  IRON 26*    Coagulation profile No  results for input(s): INR, PROTIME in the last 168 hours.  No results for input(s): DDIMER in the last 72 hours.  Cardiac Enzymes No results for input(s): CKMB, TROPONINI, MYOGLOBIN in the last 168 hours.  Invalid input(s): CK ------------------------------------------------------------------------------------------------------------------ No results found for: BNP  Inpatient Medications  Scheduled Meds: . atorvastatin  10 mg Oral QPM  . clonazePAM  0.5 mg Oral BID  . donepezil  10 mg Oral QHS  . enoxaparin (LOVENOX) injection  40 mg Subcutaneous QHS  . escitalopram  10 mg Oral QHS  . ferrous sulfate  325 mg Oral Q breakfast  . fesoterodine  4 mg Oral Daily  . Melatonin  5 mg Oral QHS  . memantine  7 mg Oral Daily  . nystatin-triamcinolone  1 application Topical TID  . pantoprazole  40 mg Oral Daily  . primidone  50 mg Oral Daily  . [START ON 10/20/2018] propranolol ER  60 mg Oral Daily  . traZODone  50 mg Oral QHS   Continuous Infusions: . sodium chloride 50 mL/hr at 10/18/18 1843  . cefTRIAXone (ROCEPHIN)  IV Stopped (10/18/18 2259)   PRN Meds:.acetaminophen  Micro Results Recent Results (from the past 240 hour(s))  Urine culture     Status: None   Collection Time: 10/16/18  7:26 PM   Specimen: Urine, Random  Result Value Ref Range Status   Specimen Description   Final    URINE, RANDOM Performed at [facility], [place] [address], [place], [place] [address]    Special Requests   Final    NONE Performed at [facility], [place] [address], [place], [place] [address]    Culture   Final    Multiple bacterial morphotypes present, none predominant. Suggest appropriate recollection if clinically indicated.   Report Status 10/18/2018 FINAL  Final  SARS Coronavirus 2 [facility] order, Performed in [facility] hospital lab) Nasopharyngeal Nasopharyngeal Swab     Status: None   Collection Time: 10/16/18  9:07 PM   Specimen: Nasopharyngeal  Swab  Result Value Ref Range Status   SARS Coronavirus 2 NEGATIVE NEGATIVE Final    Comment: (NOTE) If result is NEGATIVE SARS-CoV-2 target nucleic acids are NOT DETECTED. The SARS-CoV-2 RNA is generally detectable in upper and lower  respiratory specimens during the acute phase of infection. The lowest  concentration of SARS-CoV-2 viral copies this assay can detect is 250  copies / mL. A negative result does not preclude SARS-CoV-2 infection  and should not be used as the sole basis for treatment or other  patient management decisions.  A negative result may occur with  improper specimen collection / handling, submission of specimen other  than nasopharyngeal swab, presence of viral mutation(s) within the  areas targeted by this assay, and inadequate number of viral copies  (<250 copies / mL). A negative result must be combined with clinical  observations, patient history, and epidemiological  information. If result is POSITIVE SARS-CoV-2 target nucleic acids are DETECTED. The SARS-CoV-2 RNA is generally detectable in upper and lower  respiratory specimens dur ing the acute phase of infection.  Positive  results are indicative of active infection with SARS-CoV-2.  Clinical  correlation with patient history and other diagnostic information is  necessary to determine patient infection status.  Positive results do  not rule out bacterial infection or co-infection with other viruses. If result is PRESUMPTIVE POSTIVE SARS-CoV-2 nucleic acids MAY BE PRESENT.   A presumptive positive result was obtained on the submitted specimen  and confirmed on repeat testing.  While 2019 novel coronavirus  (SARS-CoV-2) nucleic acids may be present in the submitted sample  additional confirmatory testing may be necessary for epidemiological  and / or clinical management purposes  to differentiate between  SARS-CoV-2 and other Sarbecovirus currently known to infect humans.  If clinically indicated  additional testing with an alternate test  methodology [phone]) is advised. The SARS-CoV-2 RNA is generally  detectable in upper and lower respiratory sp ecimens during the acute  phase of infection. The expected result is Negative. Fact Sheet for Patients:  StrictlyIdeas.no Fact Sheet for Healthcare Providers: BankingDealers.co.za This test is not yet approved or cleared by the [place] FDA and has been authorized for detection and/or diagnosis of SARS-CoV-2 by FDA under an Emergency Use Authorization (EUA).  This EUA will remain in effect (meaning this test can be used) for the duration of the COVID-19 declaration under Section 564(b)(1) of the Act, 21 U.S.C. section 360bbb-3(b)(1), unless the authorization is terminated or revoked sooner. Performed at [facility], [place] [address] [place], [place] [address]     Radiology Reports Ct Abdomen Pelvis W Contrast  Result Date: 09/23/2018 CLINICAL DATA:  73 year old with vaginal bleeding.  Abdominal pain. EXAM: CT ABDOMEN AND PELVIS WITH CONTRAST TECHNIQUE: Multidetector CT imaging of the abdomen and pelvis was performed using the standard protocol following bolus administration of intravenous contrast. CONTRAST:  52mL OMNIPAQUE IOHEXOL 300 MG/ML  SOLN COMPARISON:  None. FINDINGS: Lower chest: Linear atelectasis or scarring in both lower lobes. Trace right pleural thickening/minimal pleural fluid. Hepatobiliary: 16 mm cyst in the left lobe of the liver, 11 mm cyst in the left hepatic dome. No suspicious hepatic lesion. Possible vague 10 mm lesion in the anterior right hepatic dome, image 9 series 2, does not represent cyst. Clips in the gallbladder fossa postcholecystectomy. No biliary dilatation. Pancreas: No ductal dilatation or inflammation. Spleen: Normal in size without focal abnormality. Splenule at the hilum. Adrenals/Urinary Tract: Subcentimeter left adrenal  nodularity. Right adrenal gland is normal. Severe right hydronephrosis with associated severe cortical thinning. There is also right ureteral dilatation to the pelvis at site of ill-defined pelvic mass. No perinephric edema. Absent excretion on delayed phase imaging. Parapelvic cyst in the left kidney without left hydronephrosis. Additional smaller cortical cysts in the left kidney. Urinary bladder is partially distended. Stomach/Bowel: Bowel evaluation is limited in the absence of enteric contrast. Tiny hiatal hernia. Stomach is decompressed. Proximal small bowel are unremarkable. Fluid-filled nondilated pelvic small bowel loops. Pelvic bowel loops in the right adnexa are tethered and contiguous with and cannot be delineated from ill-defined adnexal mass that is contiguous with the right aspect of the vaginal cuff. High-riding cecum in the right mid abdomen. Liquid stool in the proximal colon with formed stool distally. There is colonic tortuosity. Enteric sutures in the pelvis. Distal colonic diverticulosis without diverticulitis. Vascular/Lymphatic: Mild aortic atherosclerosis without aneurysm. Portal  vein is patent. No enlarged abdominal or pelvic lymph nodes. Reproductive: Post hysterectomy. There is ill-defined soft tissue density contiguous with the vaginal cuff extending 4.5 cm cranially. Right adnexal mass which is difficult to delineate from adjacent and contiguous bowel, series 2, image 61. Other: No significant free fluid.  No free air. Musculoskeletal: Hemangioma within T12 vertebral body. There are no acute or suspicious osseous abnormalities. IMPRESSION: 1. Post hysterectomy. Ill-defined right adnexal mass which is contiguous with the right aspect of the vaginal cuff, difficult to delineate size due to adjacent bowel loops. This measures at least 4.5 cm in cranial caudal dimension. Adjacent small bowel loops tethered. This lesion obstructs the right ureter with severe right hydroureteronephrosis and  renal cortical thinning. 2. Multiple hepatic cysts. Additional vague 10 mm hypodense lesion in the right dome is too small to characterize. 3. Mild colonic diverticulosis without diverticulitis. 4.  Aortic Atherosclerosis (ICD10-I70.0). Electronically Signed   By: Keith Rake M.D.   On: 09/23/2018 22:04    Time Spent in minutes  25   Everley Evora M.D on 10/19/2018 at 9:18 AM  Between 7am to 7pm - Pager - [phone]  After 7pm go to [email or web address] - password [facility]  [facility] -  Office  [phone]

## 2018-10-19 NOTE — Progress Notes (Signed)
     Assessment & Plan: Right pelvic mass with involvement of small intestine, vaginal cuff fistula  CT guided biopsy of mass ordered and pending  Imaging options discussed with radiology regarding evaluation of source of fistula - small bowel vs colon  Case discussed with Dr. Denman George - will coordinate with her service  Further recommendations pending biopsy results and results of imaging        Armandina Gemma, MD       [facility]       Office: [phone]   Chief Complaint: Pelvic mass with vaginal fistula, drainage  Subjective: Patient in bed, pleasant, no complaints.  No family in room this AM.  Objective: Vital signs in last 24 hours: Temp:  [98.2 F (36.8 C)-99.2 F (37.3 C)] 98.2 F (36.8 C) (09/16 0730) Pulse Rate:  [57-72] 68 (09/16 0730) Resp:  [19-20] 19 (09/16 0730) BP: (106-143)/(55-68) 118/68 (09/16 0730) SpO2:  [91 %-95 %] 95 % (09/16 0730) Last BM Date: 10/18/18  Intake/Output from previous day: 09/15 0701 - 09/16 0700 In: 2334.2 [P.O.:1065; I.V.:1169.2; IV Piggyback:100] Out: -  Intake/Output this shift: Total I/O In: 165.1 [P.O.:120; I.V.:45.1] Out: -   Physical Exam: HEENT - sclerae clear, mucous membranes moist Neck - soft Chest - clear bilaterally Cor - RRR Abdomen - soft; well healed midline incision; no tenderness, no mass palpable  Lab Results:  Recent Labs    10/18/18 0640 10/19/18 0607  WBC 7.6 8.5  HGB 11.0* 11.2*  HCT 34.4* 34.7*  PLT 314 344   BMET Recent Labs    10/18/18 0640 10/19/18 0607  NA 137 137  K 4.1 3.8  CL 107 105  CO2 21* 20*  GLUCOSE 98 86  BUN 16 15  CREATININE 1.30* 1.25*  CALCIUM 9.1 9.2   PT/INR No results for input(s): LABPROT, INR in the last 72 hours. Comprehensive Metabolic Panel:    Component Value Date/Time   NA 137 10/19/2018 0607   NA 137 10/18/2018 0640   K 3.8 10/19/2018 0607   K 4.1 10/18/2018 0640   CL 105 10/19/2018 0607   CL 107 10/18/2018 0640   CO2 20 (L)  10/19/2018 0607   CO2 21 (L) 10/18/2018 0640   BUN 15 10/19/2018 0607   BUN 16 10/18/2018 0640   CREATININE 1.25 (H) 10/19/2018 0607   CREATININE 1.30 (H) 10/18/2018 0640   CREATININE 0.98 10/04/2012 0811   GLUCOSE 86 10/19/2018 0607   GLUCOSE 98 10/18/2018 0640   CALCIUM 9.2 10/19/2018 0607   CALCIUM 9.1 10/18/2018 0640   AST 12 (L) 10/18/2018 0640   AST 14 (L) 09/23/2018 2027   ALT 9 10/18/2018 0640   ALT 12 09/23/2018 2027   ALKPHOS 60 10/18/2018 0640   ALKPHOS 79 09/23/2018 2027   BILITOT 0.4 10/18/2018 0640   BILITOT 1.0 09/23/2018 2027   PROT 5.5 (L) 10/18/2018 0640   PROT 6.7 09/23/2018 2027   ALBUMIN 3.3 (L) 10/18/2018 0640   ALBUMIN 4.4 09/23/2018 2027    Studies/Results: No results found.    Armandina Gemma 10/19/2018  Patient ID: [name], female   DOB: 20-Apr-1945, 73 y.o.   MRN: [id number]

## 2018-10-19 NOTE — Progress Notes (Signed)
Lovenox will be held tonight for procedure tomorrow. NPO at midnight 10/20/2018 at 0001. Consent in patient's chart.

## 2018-10-20 ENCOUNTER — Inpatient Hospital Stay (HOSPITAL_COMMUNITY): Payer: Medicare Other

## 2018-10-20 DIAGNOSIS — R1900 Intra-abdominal and pelvic swelling, mass and lump, unspecified site: Secondary | ICD-10-CM

## 2018-10-20 LAB — CREATININE, SERUM
Creatinine, Ser: 1.32 mg/dL — ABNORMAL HIGH (ref 0.44–1.00)
GFR calc Af Amer: 46 mL/min — ABNORMAL LOW (ref >60)
GFR calc non Af Amer: 39 mL/min — ABNORMAL LOW (ref >60)

## 2018-10-20 LAB — PROTIME-INR
INR: 1 (ref 0.8–1.2)
Prothrombin Time: 12.9 seconds (ref 11.4–15.2)

## 2018-10-20 MED ORDER — IOHEXOL 300 MG/ML  SOLN
80.0000 mL | Freq: Once | INTRAMUSCULAR | Status: AC | PRN
  Administered 2018-10-20: 15:00:00 80 mL via INTRAVENOUS

## 2018-10-20 MED ORDER — LIDOCAINE HCL (PF) 1 % IJ SOLN
INTRAMUSCULAR | Status: AC | PRN
  Administered 2018-10-20: 5 mL

## 2018-10-20 MED ORDER — SODIUM CHLORIDE (PF) 0.9 % IJ SOLN
INTRAMUSCULAR | Status: AC
  Filled 2018-10-20: qty 50

## 2018-10-20 MED ORDER — MIDAZOLAM HCL 2 MG/2ML IJ SOLN
INTRAMUSCULAR | Status: AC | PRN
  Administered 2018-10-20 (×2): 1 mg via INTRAVENOUS

## 2018-10-20 MED ORDER — FENTANYL CITRATE (PF) 100 MCG/2ML IJ SOLN
INTRAMUSCULAR | Status: AC | PRN
  Administered 2018-10-20: 50 ug via INTRAVENOUS

## 2018-10-20 MED ORDER — FENTANYL CITRATE (PF) 100 MCG/2ML IJ SOLN
INTRAMUSCULAR | Status: AC
  Filled 2018-10-20: qty 4

## 2018-10-20 MED ORDER — MIDAZOLAM HCL 2 MG/2ML IJ SOLN
INTRAMUSCULAR | Status: AC
  Filled 2018-10-20: qty 6

## 2018-10-20 MED ORDER — IOHEXOL 300 MG/ML  SOLN
30.0000 mL | Freq: Once | INTRAMUSCULAR | Status: AC | PRN
  Administered 2018-10-20: 30 mL via ORAL

## 2018-10-20 NOTE — Progress Notes (Signed)
Physical Therapy Treatment Patient Details Name: Alyssa Macdonald MRN: [id number] DOB: [birth year] Today's Date: 10/20/2018    History of Present Illness 73 y.o.female with medical history significant of Alzheimer's dementia, cervical dystonia, hysterectomy, bil TKAs, HTN, essential tremor,  recent diagnosis of rectovaginal fistula who presented with complaints of severe vaginal burning.    PT Comments    Patient resting supine in bed requesting to use bathroom upon PT arrival. Pt required min assist to transfer supine>sti and scoot to EOB with feet on floor. Min assist and cues required for stand pivot transfer to [facility] and pt performed toilet hygiene activities with no assist. Gait training continued today with RW and pt required cues for safe hand placement initially  With good carryover throughout session. She required repeated cues to maintain safe position to RW. She will continue to benefit from skilled PT at below venue, acute PT will follow and progress mobility as able.   Follow Up Recommendations  SNF     Equipment Recommendations  Rolling walker with 5" wheels    Recommendations for Other Services       Precautions / Restrictions Precautions Precautions: Fall Restrictions Weight Bearing Restrictions: No    Mobility  Bed Mobility Overal bed mobility: Needs Assistance Bed Mobility: Supine to Sit     Supine to sit: Min assist;HOB elevated     General bed mobility comments: Pt with increased, time, assist with LE's to scoot forward completely  Transfers Overall transfer level: Needs assistance Equipment used: Rolling walker (2 wheeled) Transfers: Sit to/from [facility] Sit to Stand: Min assist;Min guard Stand pivot transfers: Min assist       General transfer comment: heavy reliance on UEs, cues requried for safe hand placement on RW for stand pivot to [facility], incr time, unsteady on rising  Ambulation/Gait Ambulation/Gait assistance: Min  assist Gait Distance (Feet): 75 Feet Assistive device: Rolling walker (2 wheeled);1 person hand held assist Gait Pattern/deviations: Step-through pattern;Decreased stride length Gait velocity: decr   General Gait Details: cues for sfaety with hand placement on RW, pt improved hand placement requring reduced cues throughout however repeated cues needed to maintain safe proximety to [employer]             Wheelchair Mobility    Modified Rankin (Stroke Patients Only)       Balance Overall balance assessment: Needs assistance Sitting-balance support: Feet supported;No upper extremity supported Sitting balance-Leahy Scale: Fair     Standing balance support: During functional activity;Bilateral upper extremity supported Standing balance-Leahy Scale: Poor         Cognition Arousal/Alertness: Awake/alert Behavior During Therapy: WFL for tasks assessed/performed Overall Cognitive Status: History of cognitive impairments - at baseline         Exercises      General Comments        Pertinent Vitals/Pain Pain Assessment: Faces Faces Pain Scale: Hurts little more Pain Location: vaginal/rectal Pain Descriptors / Indicators: Burning Pain Intervention(s): Monitored during session(barrier cream)           PT Goals (current goals can now be found in the care plan section) Acute Rehab PT Goals PT Goal Formulation: With patient Time For Goal Achievement: 10/31/18 Potential to Achieve Goals: Good Progress towards PT goals: Progressing toward goals    Frequency    Min 2X/week      PT Plan Current plan remains appropriate       AM-PAC PT "6 Clicks" Mobility   Outcome Measure  Help needed  turning from your back to your side while in a flat bed without using bedrails?: A Little Help needed moving from lying on your back to sitting on the side of a flat bed without using bedrails?: A Little Help needed moving to and from a bed to a chair (including a  wheelchair)?: A Little Help needed standing up from a chair using your arms (e.g., wheelchair or bedside chair)?: A Little Help needed to walk in hospital room?: A Little Help needed climbing 3-5 steps with a railing? : A Lot 6 Click Score: 17    End of Session Equipment Utilized During Treatment: Gait belt Activity Tolerance: Patient tolerated treatment well;Patient limited by fatigue Patient left: with call bell/phone within reach;in chair;with chair alarm set Nurse Communication: Mobility status PT Visit Diagnosis: Unsteadiness on feet (R26.81)     Time: [id number] PT Time Calculation (min) (ACUTE ONLY): 31 min  Charges:  $Gait Training: 8-22 mins $Therapeutic Activity: 8-22 mins                     Kipp Brood, PT, DPT, [facility] Physical Therapist with [facility]  10/20/2018 2:31 PM

## 2018-10-20 NOTE — Progress Notes (Signed)
PROGRESS NOTE                                                                                                                                                                                                             Patient Demographics:    Alyssa Macdonald, is a 73 y.o. 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  She was diagnosed with a new right adnexal mass in August 2020 which was contiguous with the right aspect of her vaginal cuff.  She was discharged from the ED with plan on outpatient GYN oncology but she did not have an appointment until later this week. In the ED blood work showed drop in hemoglobin to 11.5 from 14.4 and chemistry showed mild AKI.  GYN oncology and [facility]CS consulted.  Subjective:   Denies noticing any vaginal discharge today.  Had CT-guided biopsy of the pelvic mass.   Assessment  & Plan :   Rectovaginal fistula with vaginal irritation/pain Surgery and GYN oncology following.  Recommended CT biopsy of the pelvic mass, done today.  CT of the pelvis done to evaluate the fistula (given ongoing feculent vaginal discharge).no sign of fluid collection, abscess or signs of fistula.  CT abdomen to evaluate the small bowel and source of fistula ordered.  Surgery and GYN to coordinate plan further based on CT abdomen and biopsy result. Pain control with PRN tramadol.  Active problems ?  UTI Received empiric Rocephin.  Hypotension and bradycardia discontinue propanolol.  Chronic depression and anxiety On Lexapro and  Klonopin.  Alzheimer's dementia,?  Moderate Resident of SNF.  Home meds continued  Iron deficiency/acute blood loss anemia Started on iron supplement  Hypokalemia Replenished  Physical deconditioning PT recommends SNF   Code Status : DNR  Family Communication  : Updated son on the phone on 9/16  Disposition Plan  : SNF possibly in the next 24-48 hours once work-up completed  Barriers For Discharge : Active symptoms  Consults  :  CCS, oncology  Procedures CT abdomen pelvis, CT biopsy  DVT Prophylaxis  :  Lovenox -  Lab Results  Component Value Date   PLT 344 10/19/2018    Antibiotics  :   Anti-infectives (From admission, onward)   Start     Dose/Rate Route Frequency Ordered Stop   10/17/18 2200  cefTRIAXone (ROCEPHIN) 1 g in sodium chloride 0.9 % 100 mL IVPB     1 g 200 mL/hr over 30 Minutes Intravenous Every 24 hours 10/16/18 2046     10/16/18 2015  cefTRIAXone (ROCEPHIN) 1 g in sodium chloride 0.9 % 100 mL IVPB     1 g 200 mL/hr over 30 Minutes Intravenous  Once 10/16/18 2000 10/16/18 2157        Objective:   Vitals:   10/20/18 0855 10/20/18 0858 10/20/18 0946 10/20/18 1159  BP: (!) 76/60 (!) 98/54 (!) 107/54 (!) 100/57  Pulse: 60 62 (!) 59 69  Resp: 12 12 16 17   Temp:   98.2 F (36.8 C) 97.8 F (36.6 C)  TempSrc:      SpO2: 99% 99% 97% 99%  Weight:      Height:        Wt Readings from Last 3 Encounters:  10/16/18 86.2 kg  08/17/17 81.8 kg  05/31/17 78.5 kg     Intake/Output Summary (Last 24 hours) at 10/20/2018 1251 Last data filed at 10/20/2018 1100 Gross per 24 hour  Intake 2105.87 ml  Output --  Net 2105.87 ml     Physical Exam  Gen: not in distress HEENT: moist mucosa, supple neck Chest: clear b/l, no added sounds CVS: N S1&S2, no murmurs,  GI: soft, NT, ND, BS+, erythema over the groin area, no vaginal discharge noted Musculoskeletal: warm, no edema CNS: AAOX1-2    Data Review:    CBC Recent Labs  Lab 10/16/18 1532  10/16/18 2107 10/17/18 0649 10/18/18 0640 10/19/18 0607  WBC 7.7 9.8 7.5 7.6 8.5  HGB 11.5* 12.5 11.0* 11.0* 11.2*  HCT 36.0 38.2 34.7* 34.4* 34.7*  PLT 352 353 311 314 344  MCV 93.8 92.9 94.0 94.0 93.3  MCH 29.9 30.4 29.8 30.1 30.1  MCHC 31.9 32.7 31.7 32.0 32.3  RDW 12.9 12.7 12.7 12.7 12.7  LYMPHSABS  --   --   --  1.5 1.2  MONOABS  --   --   --  0.7 0.7  EOSABS  --   --   --  0.2 0.1  BASOSABS  --   --   --  0.0 0.0    Chemistries  Recent Labs  Lab 10/16/18 1532 10/16/18 2107 10/17/18 0649 10/18/18 0640 10/19/18 0607  NA 139  --  140 137 137  K 3.4*  --  3.4* 4.1 3.8  CL 105  --  108 107 105  CO2 23  --  22 21* 20*  GLUCOSE 95  --  91 98 86  BUN 15  --  13 16 15   CREATININE 1.56* 1.40* 1.37* 1.30* 1.25*  CALCIUM 8.8*  --  8.6* 9.1 9.2  MG  --   --   --  1.8  --   AST  --   --   --  12*  --   ALT  --   --   --  9  --   ALKPHOS  --   --   --  60  --   BILITOT  --   --   --  0.4  --    ------------------------------------------------------------------------------------------------------------------  No results for input(s): CHOL, HDL, LDLCALC, TRIG, CHOLHDL, LDLDIRECT in the last 72 hours.  No results found for: HGBA1C ------------------------------------------------------------------------------------------------------------------ No results for input(s): TSH, T4TOTAL, T3FREE, THYROIDAB in the last 72 hours.  Invalid input(s): FREET3 ------------------------------------------------------------------------------------------------------------------ No results for input(s): VITAMINB12, FOLATE, FERRITIN, TIBC, IRON, RETICCTPCT in the last 72 hours.  Coagulation profile Recent Labs  Lab 10/20/18 0535  INR 1.0    No results for input(s): DDIMER in the last 72 hours.  Cardiac Enzymes No results for input(s): CKMB, TROPONINI, MYOGLOBIN in the last 168 hours.  Invalid input(s):  CK ------------------------------------------------------------------------------------------------------------------ No results found for: BNP  Inpatient Medications  Scheduled Meds:  atorvastatin  10 mg Oral QPM   clonazePAM  0.5 mg Oral BID   donepezil  10 mg Oral QHS   enoxaparin (LOVENOX) injection  40 mg Subcutaneous QHS   escitalopram  10 mg Oral QHS   fentaNYL       ferrous sulfate  325 mg Oral Q breakfast   fesoterodine  4 mg Oral Daily   Melatonin  5 mg Oral QHS   memantine  7 mg Oral Daily   midazolam       nystatin-triamcinolone  1 application Topical TID   pantoprazole  40 mg Oral Daily   primidone  50 mg Oral Daily   propranolol ER  60 mg Oral Daily   traZODone  50 mg Oral QHS   Continuous Infusions:  sodium chloride 50 mL/hr at 10/20/18 0729   cefTRIAXone (ROCEPHIN)  IV 1 g (10/19/18 2208)   PRN Meds:.acetaminophen, oxyCODONE-acetaminophen  Micro Results Recent Results (from the past 240 hour(s))  Urine culture     Status: None   Collection Time: 10/16/18  7:26 PM   Specimen: Urine, Random  Result Value Ref Range Status   Specimen Description   Final    URINE, RANDOM Performed at [facility], [place] [address], [place], [place] [address]    Special Requests   Final    NONE Performed at [facility], [place] [address], [place], [place] [address]    Culture   Final    Multiple bacterial morphotypes present, none predominant. Suggest appropriate recollection if clinically indicated.   Report Status 10/18/2018 FINAL  Final  SARS Coronavirus 2 [facility] order, Performed in [facility] hospital lab) Nasopharyngeal Nasopharyngeal Swab     Status: None   Collection Time: 10/16/18  9:07 PM   Specimen: Nasopharyngeal Swab  Result Value Ref Range Status   SARS Coronavirus 2 NEGATIVE NEGATIVE Final    Comment: (NOTE) If result is NEGATIVE SARS-CoV-2 target nucleic acids are NOT DETECTED. The SARS-CoV-2 RNA is  generally detectable in upper and lower  respiratory specimens during the acute phase of infection. The lowest  concentration of SARS-CoV-2 viral copies this assay can detect is 250  copies / mL. A negative result does not preclude SARS-CoV-2 infection  and should not be used as the sole basis for treatment or other  patient management decisions.  A negative result may occur with  improper specimen collection / handling, submission of specimen other  than nasopharyngeal swab, presence of viral mutation(s) within the  areas targeted by this assay, and inadequate number of viral copies  (<250 copies / mL). A negative result must be combined with clinical  observations, patient history, and epidemiological information. If result is POSITIVE SARS-CoV-2 target nucleic acids are DETECTED. The SARS-CoV-2 RNA is generally detectable in upper and lower  respiratory specimens dur ing the  acute phase of infection.  Positive  results are indicative of active infection with SARS-CoV-2.  Clinical  correlation with patient history and other diagnostic information is  necessary to determine patient infection status.  Positive results do  not rule out bacterial infection or co-infection with other viruses. If result is PRESUMPTIVE POSTIVE SARS-CoV-2 nucleic acids MAY BE PRESENT.   A presumptive positive result was obtained on the submitted specimen  and confirmed on repeat testing.  While 2019 novel coronavirus  (SARS-CoV-2) nucleic acids may be present in the submitted sample  additional confirmatory testing may be necessary for epidemiological  and / or clinical management purposes  to differentiate between  SARS-CoV-2 and other Sarbecovirus currently known to infect humans.  If clinically indicated additional testing with an alternate test  methodology [phone]) is advised. The SARS-CoV-2 RNA is generally  detectable in upper and lower respiratory sp ecimens during the acute  phase of  infection. The expected result is Negative. Fact Sheet for Patients:  StrictlyIdeas.no Fact Sheet for Healthcare Providers: BankingDealers.co.za This test is not yet approved or cleared by the [place] FDA and has been authorized for detection and/or diagnosis of SARS-CoV-2 by FDA under an Emergency Use Authorization (EUA).  This EUA will remain in effect (meaning this test can be used) for the duration of the COVID-19 declaration under Section 564(b)(1) of the Act, 21 U.S.C. section 360bbb-3(b)(1), unless the authorization is terminated or revoked sooner. Performed at [facility], [place] [address], [place], [place] [address]     Radiology Reports Ct Pelvis W Contrast  Result Date: 10/19/2018 CLINICAL DATA:  73 year old female with burning in the region of the inner thigh. EXAM: CT PELVIS WITH CONTRAST TECHNIQUE: Multidetector CT imaging of the pelvis was performed using the standard protocol following the bolus administration of intravenous contrast. CONTRAST:  39mL OMNIPAQUE IOHEXOL 300 MG/ML  SOLN COMPARISON:  CT of the abdomen pelvis dated 09/23/2018 FINDINGS: Urinary Tract: There is dilatation of the visualized right ureter likely secondary to obstruction by the right adnexal/pelvic mass as seen on the CT of 09/23/2018. The visualized left ureter appears unremarkable. Bowel: Rectal contrast is noted. There is sigmoid diverticulosis without active inflammatory changes. There is tethering of multiple loops of small bowel to the right adnexal mass consistent with adhesions there is mild dilatation of small-bowel loops in the right hemipelvis measuring up to 3.5 cm indicative of a degree obstruction. Evidence of prior small bowel surgery with anastomotic suture in the left hemipelvis. Vascular/Lymphatic: The abdominal aorta and IVC are grossly unremarkable. No adenopathy. Reproductive: Hysterectomy. There is a 4.9 x 4.6 x 5.0  cm soft tissue mass in the region the right adnexa contiguous with the vaginal cuff. There is complex fluid or air and debris within the center of this mass likely extending from the vagina. There is irregularity and nodularity of the border of these mass with desmoplastic changes and tethering of the adjacent small bowel loops. Other: None no perirectal abscess or inflammatory changes. No fluid collection or soft tissue air. Musculoskeletal: Osteopenia. No acute osseous pathology. Left S2-S3 Tarlov cyst. IMPRESSION: 1. No fluid collection, abscess or soft tissue air in the perirectal region or medial thighs. 2. Right adnexal soft tissue contiguous with the vaginal cuff similar to prior CT. There is associated tethering of the adjacent small bowel loops with probable degree of small-bowel obstruction as well as obstruction of the distal right ureter. MRI may provide better evaluation of this mass. Electronically Signed   By:  Anner Crete M.D.   On: 10/19/2018 16:20   Ct Abdomen Pelvis W Contrast  Result Date: 09/23/2018 CLINICAL DATA:  73 year old with vaginal bleeding.  Abdominal pain. EXAM: CT ABDOMEN AND PELVIS WITH CONTRAST TECHNIQUE: Multidetector CT imaging of the abdomen and pelvis was performed using the standard protocol following bolus administration of intravenous contrast. CONTRAST:  67mL OMNIPAQUE IOHEXOL 300 MG/ML  SOLN COMPARISON:  None. FINDINGS: Lower chest: Linear atelectasis or scarring in both lower lobes. Trace right pleural thickening/minimal pleural fluid. Hepatobiliary: 16 mm cyst in the left lobe of the liver, 11 mm cyst in the left hepatic dome. No suspicious hepatic lesion. Possible vague 10 mm lesion in the anterior right hepatic dome, image 9 series 2, does not represent cyst. Clips in the gallbladder fossa postcholecystectomy. No biliary dilatation. Pancreas: No ductal dilatation or inflammation. Spleen: Normal in size without focal abnormality. Splenule at the hilum.  Adrenals/Urinary Tract: Subcentimeter left adrenal nodularity. Right adrenal gland is normal. Severe right hydronephrosis with associated severe cortical thinning. There is also right ureteral dilatation to the pelvis at site of ill-defined pelvic mass. No perinephric edema. Absent excretion on delayed phase imaging. Parapelvic cyst in the left kidney without left hydronephrosis. Additional smaller cortical cysts in the left kidney. Urinary bladder is partially distended. Stomach/Bowel: Bowel evaluation is limited in the absence of enteric contrast. Tiny hiatal hernia. Stomach is decompressed. Proximal small bowel are unremarkable. Fluid-filled nondilated pelvic small bowel loops. Pelvic bowel loops in the right adnexa are tethered and contiguous with and cannot be delineated from ill-defined adnexal mass that is contiguous with the right aspect of the vaginal cuff. High-riding cecum in the right mid abdomen. Liquid stool in the proximal colon with formed stool distally. There is colonic tortuosity. Enteric sutures in the pelvis. Distal colonic diverticulosis without diverticulitis. Vascular/Lymphatic: Mild aortic atherosclerosis without aneurysm. Portal vein is patent. No enlarged abdominal or pelvic lymph nodes. Reproductive: Post hysterectomy. There is ill-defined soft tissue density contiguous with the vaginal cuff extending 4.5 cm cranially. Right adnexal mass which is difficult to delineate from adjacent and contiguous bowel, series 2, image 61. Other: No significant free fluid.  No free air. Musculoskeletal: Hemangioma within T12 vertebral body. There are no acute or suspicious osseous abnormalities. IMPRESSION: 1. Post hysterectomy. Ill-defined right adnexal mass which is contiguous with the right aspect of the vaginal cuff, difficult to delineate size due to adjacent bowel loops. This measures at least 4.5 cm in cranial caudal dimension. Adjacent small bowel loops tethered. This lesion obstructs the right  ureter with severe right hydroureteronephrosis and renal cortical thinning. 2. Multiple hepatic cysts. Additional vague 10 mm hypodense lesion in the right dome is too small to characterize. 3. Mild colonic diverticulosis without diverticulitis. 4.  Aortic Atherosclerosis (ICD10-I70.0). Electronically Signed   By: Keith Rake M.D.   On: 09/23/2018 22:04   Ct Biopsy  Result Date: 10/20/2018 INDICATION: GYN malignancy, right vaginal cuff mass with possible adjacent fistula EXAM: CT-GUIDED CORE BIOPSY RIGHT VAGINAL CUFF MASS MEDICATIONS: 1% LIDOCAINE LOCAL ANESTHESIA/SEDATION: 2.0 mg IV Versed; 50 mcg IV Fentanyl Moderate Sedation Time:  12 minutes The patient was continuously monitored during the procedure by the interventional radiology nurse under my direct supervision. PROCEDURE: The procedure, risks, benefits, and alternatives were explained to the patient. Questions regarding the procedure were encouraged and answered. The patient understands and consents to the procedure. Previous imaging reviewed. Patient position prone. Noncontrast localization CT performed. The right vaginal soft tissue mass was localized and marked for  a posterior trans gluteal approach. Under sterile conditions and local anesthesia, a 17 gauge coaxial guide needle was advanced from right posterior trans gluteal approach to the right vaginal cuff mass. Needle position confirmed with CT. 3 18 gauge core biopsies obtained. Samples were intact and non fragmented. These were placed in formalin. Needle removed. Postprocedure imaging demonstrates hematoma. Patient tolerated the procedure well without complication. Vital sign monitoring by nursing staff during the procedure will continue as patient is in the special procedures unit for post procedure observation. FINDINGS: The images document guide needle placement within the right vaginal cuff mass. Post biopsy images demonstrate no hemorrhage or hematoma. COMPLICATIONS: None immediate.  IMPRESSION: Successful CT-guided core biopsy of the right vaginal cuff mass Electronically Signed   By: Jerilynn Mages.  Shick M.D.   On: 10/20/2018 09:38    Time Spent in minutes  25   Jonas Goh M.D on 10/20/2018 at 12:51 PM  Between 7am to 7pm - Pager - [phone]  After 7pm go to [email or web address] - password [facility]  [facility] -  Office  [phone]

## 2018-10-20 NOTE — Progress Notes (Signed)
[facility] Progress Note     Subjective: CC-  Sitting up in bed sipping on contrast. Just got back to room from biopsy. She complains of mild right sided abdominal pain. No n/v.   Objective: Vital signs in last 24 hours: Temp:  [98.2 F (36.8 C)-98.6 F (37 C)] 98.2 F (36.8 C) (09/17 0946) Pulse Rate:  [59-82] 59 (09/17 0946) Resp:  [12-19] 16 (09/17 0946) BP: (76-137)/(50-95) 107/54 (09/17 0946) SpO2:  [95 %-100 %] 97 % (09/17 0946) Last BM Date: 10/19/18  Intake/Output from previous day: 09/16 0701 - 09/17 0700 In: 1119.2 [P.O.:360; I.V.:659.2; IV Piggyback:100] Out: -  Intake/Output this shift: No intake/output data recorded.  PE: Gen:  Alert, NAD, pleasant HEENT: EOM's intact, pupils equal and round Pulm:  Rate and effort normal Abd: Soft, NT/ND, +BS, no HSM GU: exam deferred today Skin: warm and dry  Lab Results:  Recent Labs    10/18/18 0640 10/19/18 0607  WBC 7.6 8.5  HGB 11.0* 11.2*  HCT 34.4* 34.7*  PLT 314 344   BMET Recent Labs    10/18/18 0640 10/19/18 0607  NA 137 137  K 4.1 3.8  CL 107 105  CO2 21* 20*  GLUCOSE 98 86  BUN 16 15  CREATININE 1.30* 1.25*  CALCIUM 9.1 9.2   PT/INR Recent Labs    10/20/18 0535  LABPROT 12.9  INR 1.0   CMP     Component Value Date/Time   NA 137 10/19/2018 0607   K 3.8 10/19/2018 0607   CL 105 10/19/2018 0607   CO2 20 (L) 10/19/2018 0607   GLUCOSE 86 10/19/2018 0607   BUN 15 10/19/2018 0607   CREATININE 1.25 (H) 10/19/2018 0607   CREATININE 0.98 10/04/2012 0811   CALCIUM 9.2 10/19/2018 0607   PROT 5.5 (L) 10/18/2018 0640   ALBUMIN 3.3 (L) 10/18/2018 0640   AST 12 (L) 10/18/2018 0640   ALT 9 10/18/2018 0640   ALKPHOS 60 10/18/2018 0640   BILITOT 0.4 10/18/2018 0640   GFRNONAA 42 (L) 10/19/2018 0607   GFRNONAA 59 (L) 10/04/2012 0811   GFRAA 49 (L) 10/19/2018 0607   GFRAA 68 10/04/2012 0811   Lipase     Component Value Date/Time   LIPASE 37 09/23/2018 2027        Studies/Results: Ct Pelvis W Contrast  Result Date: 10/19/2018 CLINICAL DATA:  73 year old female with burning in the region of the inner thigh. EXAM: CT PELVIS WITH CONTRAST TECHNIQUE: Multidetector CT imaging of the pelvis was performed using the standard protocol following the bolus administration of intravenous contrast. CONTRAST:  36mL OMNIPAQUE IOHEXOL 300 MG/ML  SOLN COMPARISON:  CT of the abdomen pelvis dated 09/23/2018 FINDINGS: Urinary Tract: There is dilatation of the visualized right ureter likely secondary to obstruction by the right adnexal/pelvic mass as seen on the CT of 09/23/2018. The visualized left ureter appears unremarkable. Bowel: Rectal contrast is noted. There is sigmoid diverticulosis without active inflammatory changes. There is tethering of multiple loops of small bowel to the right adnexal mass consistent with adhesions there is mild dilatation of small-bowel loops in the right hemipelvis measuring up to 3.5 cm indicative of a degree obstruction. Evidence of prior small bowel surgery with anastomotic suture in the left hemipelvis. Vascular/Lymphatic: The abdominal aorta and IVC are grossly unremarkable. No adenopathy. Reproductive: Hysterectomy. There is a 4.9 x 4.6 x 5.0 cm soft tissue mass in the region the right adnexa contiguous with the vaginal cuff. There is complex fluid or air  and debris within the center of this mass likely extending from the vagina. There is irregularity and nodularity of the border of these mass with desmoplastic changes and tethering of the adjacent small bowel loops. Other: None no perirectal abscess or inflammatory changes. No fluid collection or soft tissue air. Musculoskeletal: Osteopenia. No acute osseous pathology. Left S2-S3 Tarlov cyst. IMPRESSION: 1. No fluid collection, abscess or soft tissue air in the perirectal region or medial thighs. 2. Right adnexal soft tissue contiguous with the vaginal cuff similar to prior CT. There is  associated tethering of the adjacent small bowel loops with probable degree of small-bowel obstruction as well as obstruction of the distal right ureter. MRI may provide better evaluation of this mass. Electronically Signed   By: Anner Crete M.D.   On: 10/19/2018 16:20   Ct Biopsy  Result Date: 10/20/2018 INDICATION: GYN malignancy, right vaginal cuff mass with possible adjacent fistula EXAM: CT-GUIDED CORE BIOPSY RIGHT VAGINAL CUFF MASS MEDICATIONS: 1% LIDOCAINE LOCAL ANESTHESIA/SEDATION: 2.0 mg IV Versed; 50 mcg IV Fentanyl Moderate Sedation Time:  12 minutes The patient was continuously monitored during the procedure by the interventional radiology nurse under my direct supervision. PROCEDURE: The procedure, risks, benefits, and alternatives were explained to the patient. Questions regarding the procedure were encouraged and answered. The patient understands and consents to the procedure. Previous imaging reviewed. Patient position prone. Noncontrast localization CT performed. The right vaginal soft tissue mass was localized and marked for a posterior trans gluteal approach. Under sterile conditions and local anesthesia, a 17 gauge coaxial guide needle was advanced from right posterior trans gluteal approach to the right vaginal cuff mass. Needle position confirmed with CT. 3 18 gauge core biopsies obtained. Samples were intact and non fragmented. These were placed in formalin. Needle removed. Postprocedure imaging demonstrates hematoma. Patient tolerated the procedure well without complication. Vital sign monitoring by nursing staff during the procedure will continue as patient is in the special procedures unit for post procedure observation. FINDINGS: The images document guide needle placement within the right vaginal cuff mass. Post biopsy images demonstrate no hemorrhage or hematoma. COMPLICATIONS: None immediate. IMPRESSION: Successful CT-guided core biopsy of the right vaginal cuff mass  Electronically Signed   By: Jerilynn Mages.  Shick M.D.   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 Denman George following as well  ID - rocephin 9/13>> FEN - IVF, NPO VTE - SCDs, lovenox Foley - none Follow up - TBD  Plan: CT with oral contrast today. Will make further recommendations pending biopsy results and results of imaging.   LOS: 3 days  Wellington Hampshire , [facility] 10/20/2018, 9:56 AM Pager: [phone] Mon-Thurs 7:00 am-4:30 pm Fri 7:00 am -11:30 AM Sat-Sun 7:00 am-11:30 am

## 2018-10-20 NOTE — Care Management Important Message (Signed)
Important Message  Patient Details IM Letter given to Sharren Bridge SW to present to the Patient Name: Alyssa Macdonald MRN: [id number] Date of Birth: 1946/01/07   Medicare Important Message Given:  Yes     Kerin Salen 10/20/2018, 9:56 AM

## 2018-10-20 NOTE — Sedation Documentation (Signed)
B/P 78/50 NS increased to 200cc/hr

## 2018-10-20 NOTE — Procedures (Signed)
Rt vaginal cuff mass  S/p CT core bx  No comp Stable ebl min Path pending Full report in pacs

## 2018-10-21 ENCOUNTER — Other Ambulatory Visit: Payer: Self-pay

## 2018-10-21 MED ORDER — INFLUENZA VAC A&B SA ADJ QUAD 0.5 ML IM PRSY
0.5000 mL | PREFILLED_SYRINGE | INTRAMUSCULAR | Status: DC
  Filled 2018-10-21: qty 0.5

## 2018-10-21 NOTE — Progress Notes (Signed)
Assumed care of pt from off going RN. Condition stable. Cont with current plan of care

## 2018-10-21 NOTE — Progress Notes (Signed)
[facility] Progress Note     Subjective: CC-  No complaints this morning. Just finished eating breakfast. Denies abdominal pain. Denies noticing any vaginal pain or discharge.  CT guided biopsy performed yesterday, results pending. CT scan yesterday showed likely cecal mass abutting vaginal cuff and pelvic loops of small bowel with 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  Objective: Vital signs in last 24 hours: Temp:  [97.8 F (36.6 C)-99 F (37.2 C)] 97.8 F (36.6 C) (09/18 0509) Pulse Rate:  [56-80] 56 (09/18 0509) Resp:  [17-20] 18 (09/18 0509) BP: (100-130)/(56-74) 111/56 (09/18 0509) SpO2:  [95 %-99 %] 97 % (09/18 0509) Last BM Date: 10/20/18  Intake/Output from previous day: 09/17 0701 - 09/18 0700 In: 2340.7 [P.O.:1216; I.V.:1124.7] Out: 0  Intake/Output this shift: No intake/output data recorded.  PE: Gen:  Alert, NAD, pleasant HEENT: EOM's intact, pupils equal and round Pulm:  Rate and effort normal Abd: Soft, NT/ND, +BS, no HSM GU: severe excoriation noted of external genitalia extending into groin with stool noted at opening of vagina Skin: warm and dry  Lab Results:  Recent Labs    10/19/18 0607  WBC 8.5  HGB 11.2*  HCT 34.7*  PLT 344   BMET Recent Labs    10/19/18 0607 10/20/18 1258  NA 137  --   K 3.8  --   CL 105  --   CO2 20*  --   GLUCOSE 86  --   BUN 15  --   CREATININE 1.25* 1.32*  CALCIUM 9.2  --    PT/INR Recent Labs    10/20/18 0535  LABPROT 12.9  INR 1.0   CMP     Component Value Date/Time   NA 137 10/19/2018 0607   K 3.8 10/19/2018 0607   CL 105 10/19/2018 0607   CO2 20 (L) 10/19/2018 0607   GLUCOSE 86 10/19/2018 0607   BUN 15 10/19/2018 0607   CREATININE 1.32 (H) 10/20/2018 1258   CREATININE 0.98 10/04/2012 0811   CALCIUM 9.2 10/19/2018 0607   PROT 5.5 (L) 10/18/2018 0640   ALBUMIN 3.3 (L) 10/18/2018 0640   AST 12 (L) 10/18/2018 0640   ALT 9 10/18/2018 0640   ALKPHOS 60 10/18/2018 0640   BILITOT 0.4  10/18/2018 0640   GFRNONAA 39 (L) 10/20/2018 1258   GFRNONAA 59 (L) 10/04/2012 0811   GFRAA 46 (L) 10/20/2018 1258   GFRAA 68 10/04/2012 0811   Lipase     Component Value Date/Time   LIPASE 37 09/23/2018 2027       Studies/Results: Ct Pelvis W Contrast  Result Date: 10/19/2018 CLINICAL DATA:  73 year old female with burning in the region of the inner thigh. EXAM: CT PELVIS WITH CONTRAST TECHNIQUE: Multidetector CT imaging of the pelvis was performed using the standard protocol following the bolus administration of intravenous contrast. CONTRAST:  81mL OMNIPAQUE IOHEXOL 300 MG/ML  SOLN COMPARISON:  CT of the abdomen pelvis dated 09/23/2018 FINDINGS: Urinary Tract: There is dilatation of the visualized right ureter likely secondary to obstruction by the right adnexal/pelvic mass as seen on the CT of 09/23/2018. The visualized left ureter appears unremarkable. Bowel: Rectal contrast is noted. There is sigmoid diverticulosis without active inflammatory changes. There is tethering of multiple loops of small bowel to the right adnexal mass consistent with adhesions there is mild dilatation of small-bowel loops in the right hemipelvis measuring up to 3.5 cm indicative of a degree obstruction. Evidence of prior small bowel surgery with anastomotic suture in the left hemipelvis. Vascular/Lymphatic:  The abdominal aorta and IVC are grossly unremarkable. No adenopathy. Reproductive: Hysterectomy. There is a 4.9 x 4.6 x 5.0 cm soft tissue mass in the region the right adnexa contiguous with the vaginal cuff. There is complex fluid or air and debris within the center of this mass likely extending from the vagina. There is irregularity and nodularity of the border of these mass with desmoplastic changes and tethering of the adjacent small bowel loops. Other: None no perirectal abscess or inflammatory changes. No fluid collection or soft tissue air. Musculoskeletal: Osteopenia. No acute osseous pathology. Left  S2-S3 Tarlov cyst. IMPRESSION: 1. No fluid collection, abscess or soft tissue air in the perirectal region or medial thighs. 2. Right adnexal soft tissue contiguous with the vaginal cuff similar to prior CT. There is associated tethering of the adjacent small bowel loops with probable degree of small-bowel obstruction as well as obstruction of the distal right ureter. MRI may provide better evaluation of this mass. Electronically Signed   By: Anner Crete M.D.   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 CONTRAST:  39mL OMNIPAQUE IOHEXOL 300 MG/ML SOLN, 61mL OMNIPAQUE IOHEXOL 300 MG/ML SOLN COMPARISON:  CT, 09/23/2018 FINDINGS: Lower chest: Linear atelectasis at the lung bases.  Acute findings. Hepatobiliary: Leretha Dykes of the liver excluded from the field of view. Liver normal in overall size and attenuation. 1.5 cm low-density lesion, segment 4 B. 1.0 cm low-density lesion, segment 2. These are stable consistent with cysts. Subtle low-density lesion noted near the liver dome, segment 7, also likely a cyst. This was more defined as a cyst on the prior CT. No other liver lesions. Status post cholecystectomy. No bile duct dilation. Pancreas: Unremarkable. No pancreatic ductal dilatation or surrounding inflammatory changes. Spleen: Normal in size without focal abnormality. Adrenals/Urinary Tract: No adrenal masses. Right ureter is obstructed an irregular mass along right adnexa. This causes marked right hydronephrosis and moderate right  hydroureter. No left hydronephrosis. Left ureter normal in course and in caliber. Stable lower pole left renal cyst. Significant right renal cortical thinning. There is less enhancement of the right kidney compared to the left as well as delayed excretion on the right. Renal findings are stable from prior CT. Stomach/Bowel: There is an irregular mass that appears centered on the upper cecum and lower ascending colon, extending into the right adnexal soft tissues abutting the right superior vaginal cuff and pelvic loops of small bowel. Mass measures approximately 6.2 x 4.2 cm transversely by 6.1 cm superior to inferior. There is a projection of contrast, from what appears to be the cecum lying in the inferior pelvis, extending into the right pelvic mass. There is also a small bubble of air within the right pelvic mass. A small amount of contrast and a single bubble of air extends within the mass to the right vaginal cuff consistent with a fistula. There are numerous sigmoid colon diverticula without evidence of diverticulitis. No other bowel masses. No wall thickening and no inflammation. There is mild distention loops of mid to distal small bowel with air-fluid levels. No small bowel wall  thickening or adjacent inflammation. Stomach is unremarkable. Vascular/Lymphatic: No discrete enlarged lymph nodes. Aortic atherosclerosis. Reproductive: Status post hysterectomy. Other: No hernia.  No ascites. Musculoskeletal: No fracture or acute finding. No osteoblastic or osteolytic lesions. IMPRESSION: 1. No significant change when compared to the previous CT scan. Bowel contrast better defines the right pelvic mass. 2. Some contrast as well as a small amount of air lies within the mass. This appears extraluminal. The defect or fistula appears to arise from the right side of the cecum, which lies in the inferior pelvis. 3. The right pelvic mass appears centered on the upper cecum and lower ascending colon, consistent a colon  carcinoma. Mass is contiguous with the right vaginal cuff. There is a small amount of contrast and a small bubble of air that extends to the right vaginal cuff from the pelvic mass consistent with a fistula. 4. Mass measures approximately 6.2 x 4.2 x 6.1 cm. 5. No evidence of metastatic disease. Electronically Signed   By: Lajean Manes M.D.   On: 10/20/2018 16:06   Ct Biopsy  Result Date: 10/20/2018 INDICATION: GYN malignancy, right vaginal cuff mass with possible adjacent fistula EXAM: CT-GUIDED CORE BIOPSY RIGHT VAGINAL CUFF MASS MEDICATIONS: 1% LIDOCAINE LOCAL ANESTHESIA/SEDATION: 2.0 mg IV Versed; 50 mcg IV Fentanyl Moderate Sedation Time:  12 minutes The patient was continuously monitored during the procedure by the interventional radiology nurse under my direct supervision. PROCEDURE: The procedure, risks, benefits, and alternatives were explained to the patient. Questions regarding the procedure were encouraged and answered. The patient understands and consents to the procedure. Previous imaging reviewed. Patient position prone. Noncontrast localization CT performed. The right vaginal soft tissue mass was localized and marked for a posterior trans gluteal approach. Under sterile conditions and local anesthesia, a 17 gauge coaxial guide needle was advanced from right posterior trans gluteal approach to the right vaginal cuff mass. Needle position confirmed with CT. 3 18 gauge core biopsies obtained. Samples were intact and non fragmented. These were placed in formalin. Needle removed. Postprocedure imaging demonstrates hematoma. Patient tolerated the procedure well without complication. Vital sign monitoring by nursing staff during the procedure will continue as patient is in the special procedures unit for post procedure observation. FINDINGS: The images document guide needle placement within the right vaginal cuff mass. Post biopsy images demonstrate no hemorrhage or hematoma. COMPLICATIONS: None  immediate. IMPRESSION: Successful CT-guided core biopsy of the right vaginal cuff mass Electronically Signed   By: Jerilynn Mages.  Shick M.D.   On: 10/20/2018 09:38    Anti-infectives: Anti-infectives (From admission, onward)   Start     Dose/Rate Route Frequency Ordered Stop   10/17/18 2200  cefTRIAXone (ROCEPHIN) 1 g in sodium chloride 0.9 % 100 mL IVPB  Status:  Discontinued     1 g 200 mL/hr over 30 Minutes Intravenous Every 24 hours 10/16/18 2046 10/20/18 1258   10/16/18 2015  cefTRIAXone (ROCEPHIN) 1 g in sodium chloride 0.9 % 100 mL IVPB     1 g 200 mL/hr over 30 Minutes Intravenous  Once 10/16/18 2000 10/16/18 2157       Assessment/Plan Alzheimer's dementia Cervical dystonia Depression/anxiety ?UTI - received empiric rocephin Code status DNR  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  ID - rocephin 9/13>>9/17 FEN - HH diet VTE - SCDs, lovenox Foley - none Follow up - TBD  Plan: Await biopsy results, may need bowel prep and surgery next week. Will update family.   LOS: 4 days    Wellington Hampshire , [facility] 10/21/2018, 9:59 AM Pager: [phone] Mon-Thurs 7:00 am-4:30 pm Fri 7:00 am -11:30 AM Sat-Sun 7:00 am-11:30 am

## 2018-10-21 NOTE — Progress Notes (Signed)
PROGRESS NOTE                                                                                                                                                                                                             Patient Demographics:    Alyssa Macdonald, is a 73 y.o. 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  She was diagnosed with a new right adnexal mass in August 2020 which was contiguous with the right aspect of her vaginal cuff.  She was discharged from the ED with plan on outpatient GYN oncology but she did not have an appointment until later this week. In the ED blood work showed drop in hemoglobin to 11.5 from 14.4 and chemistry showed mild AKI.  GYN oncology and CCS consulted.  Subjective:   Has minimal vaginal discomfort.  No abdominal pain.  Heart rate stable.   Assessment  & Plan :   Rectovaginal fistula with vaginal irritation/pain Surgery and GYN oncology following.  CT biopsy of the pelvic mass done on 9/17. CT of the abdomen done on 9/17 showed cecal mass communicating with the vaginal cuff consistent with a fistula. Surgery and GYN following.  Planning on surgery next week once discussed with family. Pain control with PRN tramadol.  Active problems ?  UTI Received empiric Rocephin.  Hypotension and bradycardia discontinue propanolol.  Heart rate stable.  Chronic depression and anxiety On Lexapro and Klonopin.  Alzheimer's dementia,?  Moderate Resident of SNF.  Home meds continued  Iron deficiency/acute blood loss  anemia Started on iron supplement  Hypokalemia Replenished  Physical deconditioning PT recommends SNF   Code Status : DNR  Family Communication  : Updated son on the phone on 9/16  Disposition Plan  : SNF postop (surgery planned next week)  Barriers For Discharge : Active symptoms  Consults  : CCS, oncology  Procedures CT abdomen pelvis, CT biopsy  DVT Prophylaxis  :  Lovenox -  Lab Results  Component Value Date   PLT 344 10/19/2018    Antibiotics  :   Anti-infectives (From admission, onward)   Start     Dose/Rate Route Frequency Ordered Stop   10/17/18 2200  cefTRIAXone (ROCEPHIN) 1 g in sodium chloride 0.9 % 100 mL IVPB  Status:  Discontinued     1 g 200 mL/hr over 30 Minutes Intravenous Every 24 hours 10/16/18 2046 10/20/18 1258   10/16/18 2015  cefTRIAXone (ROCEPHIN) 1 g in sodium chloride 0.9 % 100 mL IVPB     1 g 200 mL/hr over 30 Minutes Intravenous  Once 10/16/18 2000 10/16/18 2157        Objective:   Vitals:   10/20/18 1159 10/20/18 1415 10/20/18 2046 10/21/18 0509  BP: (!) 100/57 115/74 130/68 (!) 111/56  Pulse: 69 80 70 (!) 56  Resp: 17 18 20 18   Temp: 97.8 F (36.6 C) 98.4 F (36.9 C) 99 F (37.2 C) 97.8 F (36.6 C)  TempSrc:   Oral Oral  SpO2: 99% 97% 95% 97%  Weight:      Height:        Wt Readings from Last 3 Encounters:  10/16/18 86.2 kg  08/17/17 81.8 kg  05/31/17 78.5 kg     Intake/Output Summary (Last 24 hours) at 10/21/2018 1115 Last data filed at 10/21/2018 0600 Gross per 24 hour  Intake 1188.89 ml  Output 0 ml  Net 1188.89 ml    Physical exam Not in distress HEENT: Moist mucosa, supple neck Chest: Clear CVs: Normal S1-S2 GI: Soft, nondistended, nontender, bowel sounds present, feculent vaginal discharge Musculoskeletal: Warm, no edema    Data Review:    CBC Recent Labs  Lab 10/16/18 1532 10/16/18 2107 10/17/18 0649 10/18/18 0640 10/19/18 0607  WBC 7.7 9.8 7.5 7.6 8.5  HGB 11.5* 12.5 11.0* 11.0* 11.2*   HCT 36.0 38.2 34.7* 34.4* 34.7*  PLT 352 353 311 314 344  MCV 93.8 92.9 94.0 94.0 93.3  MCH 29.9 30.4 29.8 30.1 30.1  MCHC 31.9 32.7 31.7 32.0 32.3  RDW 12.9 12.7 12.7 12.7 12.7  LYMPHSABS  --   --   --  1.5 1.2  MONOABS  --   --   --  0.7 0.7  EOSABS  --   --   --  0.2 0.1  BASOSABS  --   --   --  0.0 0.0    Chemistries  Recent Labs  Lab 10/16/18 1532 10/16/18 2107 10/17/18 0649 10/18/18 0640 10/19/18 0607 10/20/18 1258  NA 139  --  140 137 137  --   K 3.4*  --  3.4* 4.1 3.8  --   CL 105  --  108 107 105  --   CO2 23  --  22 21* 20*  --   GLUCOSE 95  --  91 98 86  --   BUN 15  --  13 16 15   --   CREATININE 1.56* 1.40* 1.37* 1.30* 1.25* 1.32*  CALCIUM 8.8*  --  8.6* 9.1 9.2  --   MG  --   --   --  1.8  --   --   AST  --   --   --  12*  --   --   ALT  --   --   --  9  --   --   ALKPHOS  --   --   --  60  --   --   BILITOT  --   --   --  0.4  --   --    ------------------------------------------------------------------------------------------------------------------ No results for input(s): CHOL, HDL, LDLCALC, TRIG, CHOLHDL, LDLDIRECT in the last 72 hours.  No results found for: HGBA1C ------------------------------------------------------------------------------------------------------------------ No results for input(s): TSH, T4TOTAL, T3FREE, THYROIDAB in the last 72 hours.  Invalid input(s): FREET3 ------------------------------------------------------------------------------------------------------------------ No results for input(s): VITAMINB12, FOLATE, FERRITIN, TIBC, IRON, RETICCTPCT in the last 72 hours.  Coagulation profile Recent Labs  Lab 10/20/18 0535  INR 1.0    No results for input(s): DDIMER in the last 72 hours.  Cardiac Enzymes No results for input(s): CKMB, TROPONINI, MYOGLOBIN in the last 168 hours.  Invalid input(s): CK ------------------------------------------------------------------------------------------------------------------ No  results found for: BNP  Inpatient Medications  Scheduled Meds:  atorvastatin  10 mg Oral QPM   clonazePAM  0.5 mg Oral BID   donepezil  10 mg Oral QHS   enoxaparin (LOVENOX) injection  40 mg Subcutaneous QHS   escitalopram  10 mg Oral QHS   ferrous sulfate  325 mg Oral Q breakfast   fesoterodine  4 mg Oral Daily   Melatonin  5 mg Oral QHS   memantine  7 mg Oral Daily   nystatin-triamcinolone  1 application Topical TID   pantoprazole  40 mg Oral Daily   primidone  50 mg Oral Daily   traZODone  50 mg Oral QHS   Continuous Infusions:  sodium chloride 50 mL/hr at 10/21/18 0801   PRN Meds:.acetaminophen, oxyCODONE-acetaminophen  Micro Results Recent Results (from the past 240 hour(s))  Urine culture     Status: None   Collection Time: 10/16/18  7:26 PM   Specimen: Urine, Random  Result Value Ref Range Status   Specimen Description   Final    URINE, RANDOM Performed at [facility], [place] [address], [place], [place] [address]    Special Requests   Final    NONE Performed at [facility], [place] [address] [place], [place] [address]    Culture   Final    Multiple bacterial morphotypes present, none predominant. Suggest appropriate recollection if clinically indicated.   Report Status 10/18/2018 FINAL  Final  SARS Coronavirus 2 [facility] order, Performed in [facility] hospital lab) Nasopharyngeal Nasopharyngeal Swab     Status: None   Collection Time: 10/16/18  9:07 PM   Specimen: Nasopharyngeal Swab  Result Value Ref Range Status   SARS Coronavirus 2 NEGATIVE NEGATIVE Final    Comment: (NOTE) If result is NEGATIVE SARS-CoV-2 target nucleic acids are NOT DETECTED. The SARS-CoV-2 RNA is generally detectable in upper and lower  respiratory specimens during the acute phase of infection. The lowest  concentration of SARS-CoV-2 viral copies this assay can detect is 250  copies / mL. A negative result does not preclude SARS-CoV-2  infection  and should not be used as the sole basis for treatment or other  patient management decisions.  A negative result may occur with  improper specimen collection / handling, submission of specimen other  than nasopharyngeal swab, presence of viral mutation(s) within the  areas targeted by this assay, and inadequate number of viral copies  (<250 copies / mL). A negative result must be combined with clinical  observations, patient history, and epidemiological information. If result is POSITIVE SARS-CoV-2 target nucleic acids are DETECTED. The SARS-CoV-2 RNA is generally detectable in upper and lower  respiratory specimens dur ing the acute phase of infection.  Positive  results are indicative of active infection with SARS-CoV-2.  Clinical  correlation with patient history and  other diagnostic information is  necessary to determine patient infection status.  Positive results do  not rule out bacterial infection or co-infection with other viruses. If result is PRESUMPTIVE POSTIVE SARS-CoV-2 nucleic acids MAY BE PRESENT.   A presumptive positive result was obtained on the submitted specimen  and confirmed on repeat testing.  While 2019 novel coronavirus  (SARS-CoV-2) nucleic acids may be present in the submitted sample  additional confirmatory testing may be necessary for epidemiological  and / or clinical management purposes  to differentiate between  SARS-CoV-2 and other Sarbecovirus currently known to infect humans.  If clinically indicated additional testing with an alternate test  methodology [phone]) is advised. The SARS-CoV-2 RNA is generally  detectable in upper and lower respiratory sp ecimens during the acute  phase of infection. The expected result is Negative. Fact Sheet for Patients:  StrictlyIdeas.no Fact Sheet for Healthcare Providers: BankingDealers.co.za This test is not yet approved or cleared by the [place]  FDA and has been authorized for detection and/or diagnosis of SARS-CoV-2 by FDA under an Emergency Use Authorization (EUA).  This EUA will remain in effect (meaning this test can be used) for the duration of the COVID-19 declaration under Section 564(b)(1) of the Act, 21 U.S.C. section 360bbb-3(b)(1), unless the authorization is terminated or revoked sooner. Performed at [facility], [place] [address], [place], [place] [address]     Radiology Reports Ct Pelvis W Contrast  Result Date: 10/19/2018 CLINICAL DATA:  73 year old female with burning in the region of the inner thigh. EXAM: CT PELVIS WITH CONTRAST TECHNIQUE: Multidetector CT imaging of the pelvis was performed using the standard protocol following the bolus administration of intravenous contrast. CONTRAST:  40mL OMNIPAQUE IOHEXOL 300 MG/ML  SOLN COMPARISON:  CT of the abdomen pelvis dated 09/23/2018 FINDINGS: Urinary Tract: There is dilatation of the visualized right ureter likely secondary to obstruction by the right adnexal/pelvic mass as seen on the CT of 09/23/2018. The visualized left ureter appears unremarkable. Bowel: Rectal contrast is noted. There is sigmoid diverticulosis without active inflammatory changes. There is tethering of multiple loops of small bowel to the right adnexal mass consistent with adhesions there is mild dilatation of small-bowel loops in the right hemipelvis measuring up to 3.5 cm indicative of a degree obstruction. Evidence of prior small bowel surgery with anastomotic suture in the left hemipelvis. Vascular/Lymphatic: The abdominal aorta and IVC are grossly unremarkable. No adenopathy. Reproductive: Hysterectomy. There is a 4.9 x 4.6 x 5.0 cm soft tissue mass in the region the right adnexa contiguous with the vaginal cuff. There is complex fluid or air and debris within the center of this mass likely extending from the vagina. There is irregularity and nodularity of the border of these mass  with desmoplastic changes and tethering of the adjacent small bowel loops. Other: None no perirectal abscess or inflammatory changes. No fluid collection or soft tissue air. Musculoskeletal: Osteopenia. No acute osseous pathology. Left S2-S3 Tarlov cyst. IMPRESSION: 1. No fluid collection, abscess or soft tissue air in the perirectal region or medial thighs. 2. Right adnexal soft tissue contiguous with the vaginal cuff similar to prior CT. There is associated tethering of the adjacent small bowel loops with probable degree of small-bowel obstruction as well as obstruction of the distal right ureter. MRI may provide better evaluation of this mass. Electronically Signed   By: Anner Crete M.D.   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 CONTRAST:  96mL OMNIPAQUE IOHEXOL 300 MG/ML SOLN, 11mL OMNIPAQUE IOHEXOL 300 MG/ML SOLN COMPARISON:  CT, 09/23/2018 FINDINGS: Lower chest: Linear atelectasis at the lung bases.  Acute findings. Hepatobiliary: Leretha Dykes of the liver excluded from the field of view. Liver normal in overall size and attenuation. 1.5 cm low-density lesion, segment 4 B. 1.0 cm low-density lesion, segment 2. These are stable consistent with cysts. Subtle low-density lesion noted near the liver dome, segment 7, also likely a cyst. This was more defined as a cyst on the prior CT. No other liver lesions. Status post cholecystectomy. No bile duct dilation. Pancreas: Unremarkable. No pancreatic ductal dilatation or surrounding inflammatory  changes. Spleen: Normal in size without focal abnormality. Adrenals/Urinary Tract: No adrenal masses. Right ureter is obstructed an irregular mass along right adnexa. This causes marked right hydronephrosis and moderate right hydroureter. No left hydronephrosis. Left ureter normal in course and in caliber. Stable lower pole left renal cyst. Significant right renal cortical thinning. There is less enhancement of the right kidney compared to the left as well as delayed excretion on the right. Renal findings are stable from prior CT. Stomach/Bowel: There is an irregular mass that appears centered on the upper cecum and lower ascending colon, extending into the right adnexal soft tissues abutting the right superior vaginal cuff and pelvic loops of small bowel. Mass measures approximately 6.2 x 4.2 cm transversely by 6.1 cm superior to inferior. There is a projection of contrast, from what appears to be the cecum lying in the inferior pelvis, extending into the right pelvic mass. There is also a small bubble of air within the right pelvic mass. A small amount of contrast and a single bubble of air extends within the mass to the right vaginal cuff consistent with a fistula. There are numerous sigmoid colon diverticula without evidence of diverticulitis. No other bowel masses. No wall thickening and no inflammation. There is mild distention loops of mid to distal small bowel with air-fluid levels. No small bowel wall thickening or adjacent inflammation. Stomach is unremarkable. Vascular/Lymphatic: No discrete enlarged lymph nodes. Aortic atherosclerosis. Reproductive: Status post hysterectomy. Other: No hernia.  No ascites. Musculoskeletal: No fracture or acute finding. No osteoblastic or osteolytic lesions. IMPRESSION: 1. No significant change when compared to the previous CT scan. Bowel contrast better defines the right pelvic mass. 2. Some contrast as well as a small amount of air lies within the mass. This appears  extraluminal. The defect or fistula appears to arise from the right side of the cecum, which lies in the inferior pelvis. 3. The right pelvic mass appears centered on the upper cecum and lower ascending colon, consistent a colon carcinoma. Mass is contiguous with the right vaginal cuff. There is a small amount of contrast and a small bubble of air that extends to the right vaginal cuff from the pelvic mass consistent with a fistula. 4. Mass measures approximately 6.2 x 4.2 x 6.1 cm. 5. No evidence of metastatic disease. Electronically Signed   By: Lajean Manes M.D.   On: 10/20/2018 16:06   Ct Abdomen Pelvis W Contrast  Result Date: 09/23/2018 CLINICAL DATA:  73 year old with vaginal bleeding.  Abdominal pain. EXAM: CT ABDOMEN AND PELVIS WITH CONTRAST TECHNIQUE: Multidetector CT imaging of the abdomen and pelvis was performed using the standard protocol following bolus administration of intravenous contrast. CONTRAST:  35mL OMNIPAQUE IOHEXOL 300 MG/ML  SOLN COMPARISON:  None. FINDINGS: Lower chest: Linear atelectasis or scarring in both lower lobes. Trace right pleural thickening/minimal pleural fluid. Hepatobiliary: 16 mm cyst in the left lobe of the liver, 11 mm cyst in the left hepatic dome. No suspicious hepatic lesion. Possible vague 10 mm lesion in the anterior right hepatic dome, image 9 series 2, does not represent cyst. Clips in the gallbladder fossa postcholecystectomy. No biliary dilatation. Pancreas: No ductal dilatation or inflammation. Spleen: Normal in size without focal abnormality. Splenule at the hilum. Adrenals/Urinary Tract: Subcentimeter left adrenal nodularity. Right adrenal gland is normal. Severe right hydronephrosis with associated severe cortical thinning. There is also right ureteral dilatation to the pelvis at site of ill-defined pelvic mass. No perinephric edema. Absent excretion on delayed phase imaging. Parapelvic cyst in the left kidney without left hydronephrosis. Additional  smaller cortical cysts in the left kidney. Urinary bladder is partially distended. Stomach/Bowel: Bowel evaluation is limited in the absence of enteric contrast. Tiny hiatal hernia. Stomach is decompressed. Proximal small bowel are unremarkable. Fluid-filled nondilated pelvic small bowel loops. Pelvic bowel loops in the right adnexa are tethered and contiguous with and cannot be delineated from ill-defined adnexal mass that is contiguous with the right aspect of the vaginal cuff. High-riding cecum in the right mid abdomen. Liquid stool in the proximal colon with formed stool distally. There is colonic tortuosity. Enteric sutures in the pelvis. Distal colonic diverticulosis without diverticulitis. Vascular/Lymphatic: Mild aortic atherosclerosis without aneurysm. Portal vein is patent. No enlarged abdominal or pelvic lymph nodes. Reproductive: Post hysterectomy. There is ill-defined soft tissue density contiguous with the vaginal cuff extending 4.5 cm cranially. Right adnexal mass which is difficult to delineate from adjacent and contiguous bowel, series 2, image 61. Other: No significant free fluid.  No free air. Musculoskeletal: Hemangioma within T12 vertebral body. There are no acute or suspicious osseous abnormalities. IMPRESSION: 1. Post hysterectomy. Ill-defined right adnexal mass which is contiguous with the right aspect of the vaginal cuff, difficult to delineate size due to adjacent bowel loops. This measures at least 4.5 cm in cranial caudal dimension. Adjacent small bowel loops tethered. This lesion obstructs the right ureter with severe right hydroureteronephrosis and renal cortical thinning. 2. Multiple hepatic cysts. Additional vague 10 mm hypodense lesion in the right dome is too small to characterize. 3. Mild colonic diverticulosis without diverticulitis. 4.  Aortic Atherosclerosis (ICD10-I70.0). Electronically Signed   By: Keith Rake M.D.   On: 09/23/2018 22:04   Ct Biopsy  Result Date:  10/20/2018 INDICATION: GYN malignancy, right vaginal cuff mass with possible adjacent fistula EXAM: CT-GUIDED CORE BIOPSY RIGHT VAGINAL CUFF MASS MEDICATIONS: 1% LIDOCAINE LOCAL ANESTHESIA/SEDATION: 2.0 mg IV Versed; 50 mcg IV Fentanyl Moderate Sedation Time:  12 minutes The patient was continuously monitored during the procedure by the interventional radiology nurse under my direct supervision. PROCEDURE: The procedure, risks, benefits, and alternatives were explained to the patient. Questions regarding the procedure were encouraged and answered. The patient understands and consents to the procedure. Previous imaging reviewed. Patient position prone. Noncontrast localization CT performed. The right vaginal soft tissue mass was localized and marked for a posterior trans gluteal approach. Under sterile conditions and local anesthesia, a 17 gauge coaxial guide needle was advanced from right posterior trans  gluteal approach to the right vaginal cuff mass. Needle position confirmed with CT. 3 18 gauge core biopsies obtained. Samples were intact and non fragmented. These were placed in formalin. Needle removed. Postprocedure imaging demonstrates hematoma. Patient tolerated the procedure well without complication. Vital sign monitoring by nursing staff during the procedure will continue as patient is in the special procedures unit for post procedure observation. FINDINGS: The images document guide needle placement within the right vaginal cuff mass. Post biopsy images demonstrate no hemorrhage or hematoma. COMPLICATIONS: None immediate. IMPRESSION: Successful CT-guided core biopsy of the right vaginal cuff mass Electronically Signed   By: Jerilynn Mages.  Shick M.D.   On: 10/20/2018 09:38    Time Spent in minutes  25   Jonelle Bann M.D on 10/21/2018 at 11:15 AM  Between 7am to 7pm - Pager - [phone]  After 7pm go to [email or web address] - password [facility]  [facility] -  Office  [phone]

## 2018-10-22 LAB — TROPONIN I (HIGH SENSITIVITY)
Troponin I (High Sensitivity): <2 ng/L (ref <18)
Troponin I (High Sensitivity): <2 ng/L (ref <18)

## 2018-10-22 NOTE — Progress Notes (Signed)
Patient c/o of chest pain to left side.  She denies SOB or nausea.  VItal signs are stable.  EKG performed which shows SB and no ST elevation noted.  Alyssa Corpus, NP notified via text page and new orders were placed into [facility] and carried out as ordered.  Nursing staff to continue to monitor.

## 2018-10-22 NOTE — Progress Notes (Signed)
PROGRESS NOTE                                                                                                                                                                                                             Patient Demographics:    Alyssa Macdonald, is a 73 y.o. 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  She was diagnosed with a new right adnexal mass in August 2020 which was contiguous with the right aspect of her vaginal cuff.  She was discharged from the ED with plan on outpatient GYN oncology but she did not have an appointment until later this week. In the ED blood work showed drop in hemoglobin to 11.5 from 14.4 and chemistry showed mild AKI.  GYN oncology and CCS consulted.  Subjective:   C/o chest pain overnight. No further symptoms. Troponin was negative   Assessment  & Plan :   Rectovaginal fistula with vaginal irritation/pain Surgery and GYN oncology following.  CT biopsy of the pelvic mass done on 9/17. CT of the abdomen done on 9/17 showed cecal mass communicating with the vaginal cuff consistent with a fistula. Surgery and GYN following.  Planning on surgery next week once discussed with family. Pain control with PRN tramadol.  Active problems ?  UTI Received empiric Rocephin.  Hypotension and bradycardia discontinue propanolol.  Heart rate stable.  Chronic depression and anxiety On Lexapro and Klonopin.  Alzheimer's dementia,?  Moderate Resident of SNF.  Home meds continued  Iron deficiency/acute blood loss  anemia Started on iron supplement  Hypokalemia Replenished  Physical deconditioning PT recommends SNF   Code Status : DNR  Family Communication  : none. Will update family  Disposition Plan  : SNF postop (surgery planned next week)  Barriers For Discharge : Active symptoms  Consults  : CCS, oncology  Procedures CT abdomen pelvis, CT biopsy  DVT Prophylaxis  :  Lovenox -  Lab Results  Component Value Date  PLT 344 10/19/2018    Antibiotics  :   Anti-infectives (From admission, onward)   Start     Dose/Rate Route Frequency Ordered Stop   10/17/18 2200  cefTRIAXone (ROCEPHIN) 1 g in sodium chloride 0.9 % 100 mL IVPB  Status:  Discontinued     1 g 200 mL/hr over 30 Minutes Intravenous Every 24 hours 10/16/18 2046 10/20/18 1258   10/16/18 2015  cefTRIAXone (ROCEPHIN) 1 g in sodium chloride 0.9 % 100 mL IVPB     1 g 200 mL/hr over 30 Minutes Intravenous  Once 10/16/18 2000 10/16/18 2157        Objective:   Vitals:   10/21/18 1352 10/21/18 2055 10/22/18 0435 10/22/18 0745  BP: (!) 109/59 103/70 (!) 158/81 110/61  Pulse: 64 66 67 (!) 52  Resp: 18 19 18    Temp: 98.3 F (36.8 C) 98.3 F (36.8 C) 97.9 F (36.6 C) 97.7 F (36.5 C)  TempSrc:  Oral Oral Oral  SpO2: 97% 99% 100% 96%  Weight:      Height:        Wt Readings from Last 3 Encounters:  10/16/18 86.2 kg  08/17/17 81.8 kg  05/31/17 78.5 kg     Intake/Output Summary (Last 24 hours) at 10/22/2018 1347 Last data filed at 10/22/2018 0900 Gross per 24 hour  Intake 1015.98 ml  Output --  Net 1015.98 ml    Physical exam' NAD HEENT: moist mucosa  chest: clear  CVS: NS1&S2 abd: soft, NT, ND, no vaginal discharge Musculoskeletal: warm, no edema      Data Review:    CBC Recent Labs  Lab 10/16/18 1532 10/16/18 2107 10/17/18 0649 10/18/18 0640 10/19/18 0607  WBC 7.7 9.8 7.5 7.6 8.5  HGB 11.5* 12.5 11.0* 11.0* 11.2*  HCT 36.0 38.2 34.7* 34.4* 34.7*  PLT 352 353 311 314 344  MCV 93.8  92.9 94.0 94.0 93.3  MCH 29.9 30.4 29.8 30.1 30.1  MCHC 31.9 32.7 31.7 32.0 32.3  RDW 12.9 12.7 12.7 12.7 12.7  LYMPHSABS  --   --   --  1.5 1.2  MONOABS  --   --   --  0.7 0.7  EOSABS  --   --   --  0.2 0.1  BASOSABS  --   --   --  0.0 0.0    Chemistries  Recent Labs  Lab 10/16/18 1532 10/16/18 2107 10/17/18 0649 10/18/18 0640 10/19/18 0607 10/20/18 1258  NA 139  --  140 137 137  --   K 3.4*  --  3.4* 4.1 3.8  --   CL 105  --  108 107 105  --   CO2 23  --  22 21* 20*  --   GLUCOSE 95  --  91 98 86  --   BUN 15  --  13 16 15   --   CREATININE 1.56* 1.40* 1.37* 1.30* 1.25* 1.32*  CALCIUM 8.8*  --  8.6* 9.1 9.2  --   MG  --   --   --  1.8  --   --   AST  --   --   --  12*  --   --   ALT  --   --   --  9  --   --   ALKPHOS  --   --   --  60  --   --   BILITOT  --   --   --  0.4  --   --    ------------------------------------------------------------------------------------------------------------------  No results for input(s): CHOL, HDL, LDLCALC, TRIG, CHOLHDL, LDLDIRECT in the last 72 hours.  No results found for: HGBA1C ------------------------------------------------------------------------------------------------------------------ No results for input(s): TSH, T4TOTAL, T3FREE, THYROIDAB in the last 72 hours.  Invalid input(s): FREET3 ------------------------------------------------------------------------------------------------------------------ No results for input(s): VITAMINB12, FOLATE, FERRITIN, TIBC, IRON, RETICCTPCT in the last 72 hours.  Coagulation profile Recent Labs  Lab 10/20/18 0535  INR 1.0    No results for input(s): DDIMER in the last 72 hours.  Cardiac Enzymes No results for input(s): CKMB, TROPONINI, MYOGLOBIN in the last 168 hours.  Invalid input(s): CK ------------------------------------------------------------------------------------------------------------------ No results found for: BNP  Inpatient Medications  Scheduled Meds:   atorvastatin  10 mg Oral QPM   clonazePAM  0.5 mg Oral BID   donepezil  10 mg Oral QHS   enoxaparin (LOVENOX) injection  40 mg Subcutaneous QHS   escitalopram  10 mg Oral QHS   ferrous sulfate  325 mg Oral Q breakfast   fesoterodine  4 mg Oral Daily   influenza vaccine adjuvanted  0.5 mL Intramuscular Tomorrow-1000   Melatonin  5 mg Oral QHS   memantine  7 mg Oral Daily   nystatin-triamcinolone  1 application Topical TID   pantoprazole  40 mg Oral Daily   primidone  50 mg Oral Daily   traZODone  50 mg Oral QHS   Continuous Infusions:  sodium chloride 50 mL/hr at 10/22/18 0445   PRN Meds:.acetaminophen, oxyCODONE-acetaminophen  Micro Results Recent Results (from the past 240 hour(s))  Urine culture     Status: None   Collection Time: 10/16/18  7:26 PM   Specimen: Urine, Random  Result Value Ref Range Status   Specimen Description   Final    URINE, RANDOM Performed at [facility], [place] [address], [place], [place] [address]    Special Requests   Final    NONE Performed at [facility], [place] [address], [place], [place] [address]    Culture   Final    Multiple bacterial morphotypes present, none predominant. Suggest appropriate recollection if clinically indicated.   Report Status 10/18/2018 FINAL  Final  SARS Coronavirus 2 [facility] order, Performed in [facility] hospital lab) Nasopharyngeal Nasopharyngeal Swab     Status: None   Collection Time: 10/16/18  9:07 PM   Specimen: Nasopharyngeal Swab  Result Value Ref Range Status   SARS Coronavirus 2 NEGATIVE NEGATIVE Final    Comment: (NOTE) If result is NEGATIVE SARS-CoV-2 target nucleic acids are NOT DETECTED. The SARS-CoV-2 RNA is generally detectable in upper and lower  respiratory specimens during the acute phase of infection. The lowest  concentration of SARS-CoV-2 viral copies this assay can detect is 250  copies / mL. A negative result does not preclude SARS-CoV-2  infection  and should not be used as the sole basis for treatment or other  patient management decisions.  A negative result may occur with  improper specimen collection / handling, submission of specimen other  than nasopharyngeal swab, presence of viral mutation(s) within the  areas targeted by this assay, and inadequate number of viral copies  (<250 copies / mL). A negative result must be combined with clinical  observations, patient history, and epidemiological information. If result is POSITIVE SARS-CoV-2 target nucleic acids are DETECTED. The SARS-CoV-2 RNA is generally detectable in upper and lower  respiratory specimens dur ing the acute phase of infection.  Positive  results are indicative of active infection with SARS-CoV-2.  Clinical  correlation with patient history and  other diagnostic information is  necessary to determine patient infection status.  Positive results do  not rule out bacterial infection or co-infection with other viruses. If result is PRESUMPTIVE POSTIVE SARS-CoV-2 nucleic acids MAY BE PRESENT.   A presumptive positive result was obtained on the submitted specimen  and confirmed on repeat testing.  While 2019 novel coronavirus  (SARS-CoV-2) nucleic acids may be present in the submitted sample  additional confirmatory testing may be necessary for epidemiological  and / or clinical management purposes  to differentiate between  SARS-CoV-2 and other Sarbecovirus currently known to infect humans.  If clinically indicated additional testing with an alternate test  methodology [id number]) is advised. The SARS-CoV-2 RNA is generally  detectable in upper and lower respiratory sp ecimens during the acute  phase of infection. The expected result is Negative. Fact Sheet for Patients:  StrictlyIdeas.no Fact Sheet for Healthcare Providers: BankingDealers.co.za This test is not yet approved or cleared by the [place]  FDA and has been authorized for detection and/or diagnosis of SARS-CoV-2 by FDA under an Emergency Use Authorization (EUA).  This EUA will remain in effect (meaning this test can be used) for the duration of the COVID-19 declaration under Section 564(b)(1) of the Act, 21 U.S.C. section 360bbb-3(b)(1), unless the authorization is terminated or revoked sooner. Performed at [facility], [place] [address], [place], [place] [address]     Radiology Reports Ct Pelvis W Contrast  Result Date: 10/19/2018 CLINICAL DATA:  73 year old female with burning in the region of the inner thigh. EXAM: CT PELVIS WITH CONTRAST TECHNIQUE: Multidetector CT imaging of the pelvis was performed using the standard protocol following the bolus administration of intravenous contrast. CONTRAST:  16mL OMNIPAQUE IOHEXOL 300 MG/ML  SOLN COMPARISON:  CT of the abdomen pelvis dated 09/23/2018 FINDINGS: Urinary Tract: There is dilatation of the visualized right ureter likely secondary to obstruction by the right adnexal/pelvic mass as seen on the CT of 09/23/2018. The visualized left ureter appears unremarkable. Bowel: Rectal contrast is noted. There is sigmoid diverticulosis without active inflammatory changes. There is tethering of multiple loops of small bowel to the right adnexal mass consistent with adhesions there is mild dilatation of small-bowel loops in the right hemipelvis measuring up to 3.5 cm indicative of a degree obstruction. Evidence of prior small bowel surgery with anastomotic suture in the left hemipelvis. Vascular/Lymphatic: The abdominal aorta and IVC are grossly unremarkable. No adenopathy. Reproductive: Hysterectomy. There is a 4.9 x 4.6 x 5.0 cm soft tissue mass in the region the right adnexa contiguous with the vaginal cuff. There is complex fluid or air and debris within the center of this mass likely extending from the vagina. There is irregularity and nodularity of the border of these mass  with desmoplastic changes and tethering of the adjacent small bowel loops. Other: None no perirectal abscess or inflammatory changes. No fluid collection or soft tissue air. Musculoskeletal: Osteopenia. No acute osseous pathology. Left S2-S3 Tarlov cyst. IMPRESSION: 1. No fluid collection, abscess or soft tissue air in the perirectal region or medial thighs. 2. Right adnexal soft tissue contiguous with the vaginal cuff similar to prior CT. There is associated tethering of the adjacent small bowel loops with probable degree of small-bowel obstruction as well as obstruction of the distal right ureter. MRI may provide better evaluation of this mass. Electronically Signed   By: Anner Crete M.D.   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 CONTRAST:  52mL OMNIPAQUE IOHEXOL 300 MG/ML SOLN, 77mL OMNIPAQUE IOHEXOL 300 MG/ML SOLN COMPARISON:  CT, 09/23/2018 FINDINGS: Lower chest: Linear atelectasis at the lung bases.  Acute findings. Hepatobiliary: Leretha Dykes of the liver excluded from the field of view. Liver normal in overall size and attenuation. 1.5 cm low-density lesion, segment 4 B. 1.0 cm low-density lesion, segment 2. These are stable consistent with cysts. Subtle low-density lesion noted near the liver dome, segment 7, also likely a cyst. This was more defined as a cyst on the prior CT. No other liver lesions. Status post cholecystectomy. No bile duct dilation. Pancreas: Unremarkable. No pancreatic ductal dilatation or surrounding inflammatory  changes. Spleen: Normal in size without focal abnormality. Adrenals/Urinary Tract: No adrenal masses. Right ureter is obstructed an irregular mass along right adnexa. This causes marked right hydronephrosis and moderate right hydroureter. No left hydronephrosis. Left ureter normal in course and in caliber. Stable lower pole left renal cyst. Significant right renal cortical thinning. There is less enhancement of the right kidney compared to the left as well as delayed excretion on the right. Renal findings are stable from prior CT. Stomach/Bowel: There is an irregular mass that appears centered on the upper cecum and lower ascending colon, extending into the right adnexal soft tissues abutting the right superior vaginal cuff and pelvic loops of small bowel. Mass measures approximately 6.2 x 4.2 cm transversely by 6.1 cm superior to inferior. There is a projection of contrast, from what appears to be the cecum lying in the inferior pelvis, extending into the right pelvic mass. There is also a small bubble of air within the right pelvic mass. A small amount of contrast and a single bubble of air extends within the mass to the right vaginal cuff consistent with a fistula. There are numerous sigmoid colon diverticula without evidence of diverticulitis. No other bowel masses. No wall thickening and no inflammation. There is mild distention loops of mid to distal small bowel with air-fluid levels. No small bowel wall thickening or adjacent inflammation. Stomach is unremarkable. Vascular/Lymphatic: No discrete enlarged lymph nodes. Aortic atherosclerosis. Reproductive: Status post hysterectomy. Other: No hernia.  No ascites. Musculoskeletal: No fracture or acute finding. No osteoblastic or osteolytic lesions. IMPRESSION: 1. No significant change when compared to the previous CT scan. Bowel contrast better defines the right pelvic mass. 2. Some contrast as well as a small amount of air lies within the mass. This appears  extraluminal. The defect or fistula appears to arise from the right side of the cecum, which lies in the inferior pelvis. 3. The right pelvic mass appears centered on the upper cecum and lower ascending colon, consistent a colon carcinoma. Mass is contiguous with the right vaginal cuff. There is a small amount of contrast and a small bubble of air that extends to the right vaginal cuff from the pelvic mass consistent with a fistula. 4. Mass measures approximately 6.2 x 4.2 x 6.1 cm. 5. No evidence of metastatic disease. Electronically Signed   By: Lajean Manes M.D.   On: 10/20/2018 16:06   Ct Abdomen Pelvis W Contrast  Result Date: 09/23/2018 CLINICAL DATA:  73 year old with vaginal bleeding.  Abdominal pain. EXAM: CT ABDOMEN AND PELVIS WITH CONTRAST TECHNIQUE: Multidetector CT imaging of the abdomen and pelvis was performed using the standard protocol following bolus administration of intravenous contrast. CONTRAST:  34mL OMNIPAQUE IOHEXOL 300 MG/ML  SOLN COMPARISON:  None. FINDINGS: Lower chest: Linear atelectasis or scarring in both lower lobes. Trace right pleural thickening/minimal pleural fluid. Hepatobiliary: 16 mm cyst in the left lobe of the liver, 11 mm cyst in the left hepatic dome. No suspicious hepatic lesion. Possible vague 10 mm lesion in the anterior right hepatic dome, image 9 series 2, does not represent cyst. Clips in the gallbladder fossa postcholecystectomy. No biliary dilatation. Pancreas: No ductal dilatation or inflammation. Spleen: Normal in size without focal abnormality. Splenule at the hilum. Adrenals/Urinary Tract: Subcentimeter left adrenal nodularity. Right adrenal gland is normal. Severe right hydronephrosis with associated severe cortical thinning. There is also right ureteral dilatation to the pelvis at site of ill-defined pelvic mass. No perinephric edema. Absent excretion on delayed phase imaging. Parapelvic cyst in the left kidney without left hydronephrosis. Additional  smaller cortical cysts in the left kidney. Urinary bladder is partially distended. Stomach/Bowel: Bowel evaluation is limited in the absence of enteric contrast. Tiny hiatal hernia. Stomach is decompressed. Proximal small bowel are unremarkable. Fluid-filled nondilated pelvic small bowel loops. Pelvic bowel loops in the right adnexa are tethered and contiguous with and cannot be delineated from ill-defined adnexal mass that is contiguous with the right aspect of the vaginal cuff. High-riding cecum in the right mid abdomen. Liquid stool in the proximal colon with formed stool distally. There is colonic tortuosity. Enteric sutures in the pelvis. Distal colonic diverticulosis without diverticulitis. Vascular/Lymphatic: Mild aortic atherosclerosis without aneurysm. Portal vein is patent. No enlarged abdominal or pelvic lymph nodes. Reproductive: Post hysterectomy. There is ill-defined soft tissue density contiguous with the vaginal cuff extending 4.5 cm cranially. Right adnexal mass which is difficult to delineate from adjacent and contiguous bowel, series 2, image 61. Other: No significant free fluid.  No free air. Musculoskeletal: Hemangioma within T12 vertebral body. There are no acute or suspicious osseous abnormalities. IMPRESSION: 1. Post hysterectomy. Ill-defined right adnexal mass which is contiguous with the right aspect of the vaginal cuff, difficult to delineate size due to adjacent bowel loops. This measures at least 4.5 cm in cranial caudal dimension. Adjacent small bowel loops tethered. This lesion obstructs the right ureter with severe right hydroureteronephrosis and renal cortical thinning. 2. Multiple hepatic cysts. Additional vague 10 mm hypodense lesion in the right dome is too small to characterize. 3. Mild colonic diverticulosis without diverticulitis. 4.  Aortic Atherosclerosis (ICD10-I70.0). Electronically Signed   By: Keith Rake M.D.   On: 09/23/2018 22:04   Ct Biopsy  Result Date:  10/20/2018 INDICATION: GYN malignancy, right vaginal cuff mass with possible adjacent fistula EXAM: CT-GUIDED CORE BIOPSY RIGHT VAGINAL CUFF MASS MEDICATIONS: 1% LIDOCAINE LOCAL ANESTHESIA/SEDATION: 2.0 mg IV Versed; 50 mcg IV Fentanyl Moderate Sedation Time:  12 minutes The patient was continuously monitored during the procedure by the interventional radiology nurse under my direct supervision. PROCEDURE: The procedure, risks, benefits, and alternatives were explained to the patient. Questions regarding the procedure were encouraged and answered. The patient understands and consents to the procedure. Previous imaging reviewed. Patient position prone. Noncontrast localization CT performed. The right vaginal soft tissue mass was localized and marked for a posterior trans gluteal approach. Under sterile conditions and local anesthesia, a 17 gauge coaxial guide needle was advanced from right posterior trans  gluteal approach to the right vaginal cuff mass. Needle position confirmed with CT. 3 18 gauge core biopsies obtained. Samples were intact and non fragmented. These were placed in formalin. Needle removed. Postprocedure imaging demonstrates hematoma. Patient tolerated the procedure well without complication. Vital sign monitoring by nursing staff during the procedure will continue as patient is in the special procedures unit for post procedure observation. FINDINGS: The images document guide needle placement within the right vaginal cuff mass. Post biopsy images demonstrate no hemorrhage or hematoma. COMPLICATIONS: None immediate. IMPRESSION: Successful CT-guided core biopsy of the right vaginal cuff mass Electronically Signed   By: Jerilynn Mages.  Shick M.D.   On: 10/20/2018 09:38    Time Spent in minutes  25   Ramar Nobrega M.D on 10/22/2018 at 1:47 PM  Between 7am to 7pm - Pager - [phone]  After 7pm go to [email or web address] - password [facility]  [facility] -  Office  [phone]

## 2018-10-22 NOTE — Progress Notes (Signed)
Pt is rating pain in vagina/perineum 10/10. Received 2 percocets at 2124 for pain and claims it has not helped, pt is tearful and upset d/t pain. Paged on-call MD for possible IV PRN pain med for breakthrough pain.

## 2018-10-23 DIAGNOSIS — R102 Pelvic and perineal pain: Secondary | ICD-10-CM

## 2018-10-23 LAB — CREATININE, SERUM
Creatinine, Ser: 1.03 mg/dL — ABNORMAL HIGH (ref 0.44–1.00)
GFR calc Af Amer: >60 mL/min (ref >60)
GFR calc non Af Amer: 53 mL/min — ABNORMAL LOW (ref >60)

## 2018-10-23 MED ORDER — KETOROLAC TROMETHAMINE 15 MG/ML IJ SOLN
15.0000 mg | Freq: Four times a day (QID) | INTRAMUSCULAR | Status: AC | PRN
  Administered 2018-10-24 – 2018-10-27 (×6): 15 mg via INTRAVENOUS
  Filled 2018-10-23 (×6): qty 1

## 2018-10-23 NOTE — Progress Notes (Signed)
PROGRESS NOTE                                                                                                                                                                                                             Patient Demographics:    Alyssa Macdonald, is a 73 y.o. 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  She was diagnosed with a new right adnexal mass in August 2020 which was contiguous with the right aspect of her vaginal cuff.  She was discharged from the ED with plan on outpatient GYN oncology but she did not have an appointment until later this week. In the ED blood work showed drop in hemoglobin to 11.5 from 14.4 and chemistry showed mild AKI.  GYN oncology and CCS consulted.  Subjective:   Complains of severe burning in the vaginal area.  Again having fistula discharge.  Patient frustrated with her symptoms.  Assessment  & Plan :   Rectovaginal fistula with vaginal irritation/pain Surgery and GYN oncology following.  CT biopsy of the pelvic mass done on 9/17.  Results still pending. CT of the abdomen done on 9/17 showed cecal mass communicating with the vaginal cuff consistent with a fistula. Surgery and GYN following.  Planning on surgery next week once discussed with family. Pain not well controlled on current regimen of Percocet.  Will add PRN tramadol.  Active problems ?  UTI Received empiric Rocephin.  Hypotension and bradycardia discontinue propanolol.  Heart rate stable.  Chronic depression and anxiety On Lexapro and  Klonopin.  Alzheimer's dementia,?  Moderate Resident of SNF.  Home meds continued  Iron deficiency/acute blood loss anemia Started on iron supplement  Hypokalemia Replenished  Physical deconditioning PT recommends SNF   Code Status : DNR  Family Communication  : none.   Disposition Plan  : SNF postop (surgery planned next week)  Barriers For Discharge : Active symptoms  Consults  : CCS, oncology  Procedures CT abdomen  pelvis, CT biopsy  DVT Prophylaxis  :  Lovenox -  Lab Results  Component Value Date   PLT 344 10/19/2018    Antibiotics  :   Anti-infectives (From admission, onward)   Start     Dose/Rate Route Frequency Ordered Stop   10/17/18 2200  cefTRIAXone (ROCEPHIN) 1 g in sodium chloride 0.9 % 100 mL IVPB  Status:  Discontinued     1 g 200 mL/hr over 30 Minutes Intravenous Every 24 hours 10/16/18 2046 10/20/18 1258   10/16/18 2015  cefTRIAXone (ROCEPHIN) 1 g in sodium chloride 0.9 % 100 mL IVPB     1 g 200 mL/hr over 30 Minutes Intravenous  Once 10/16/18 2000 10/16/18 2157        Objective:   Vitals:   10/22/18 0745 10/22/18 1400 10/22/18 1954 10/23/18 0559  BP: 110/61 112/60 (!) 111/57 133/65  Pulse: (!) 52 60 65 (!) 55  Resp:   16 16  Temp: 97.7 F (36.5 C) (!) 97.3 F (36.3 C) 97.6 F (36.4 C) (!) 97.5 F (36.4 C)  TempSrc: Oral Oral Oral Oral  SpO2: 96% 99% 99% 100%  Weight:      Height:        Wt Readings from Last 3 Encounters:  10/16/18 86.2 kg  08/17/17 81.8 kg  05/31/17 78.5 kg    No intake or output data in the 24 hours ending 10/23/18 1254  Physical exam Restless and uncomfortable HEENT: Moist mucosa Chest: Clear bilaterally CVs: Normal S1-S2 GI: Soft, nondistended, nontender, feculent vaginal discharge Musculoskeletal: Warm, no edema     Data Review:    CBC Recent Labs  Lab 10/16/18 1532 10/16/18 2107 10/17/18 0649 10/18/18 0640 10/19/18 0607  WBC 7.7 9.8 7.5 7.6 8.5  HGB 11.5* 12.5 11.0* 11.0* 11.2*  HCT  36.0 38.2 34.7* 34.4* 34.7*  PLT 352 353 311 314 344  MCV 93.8 92.9 94.0 94.0 93.3  MCH 29.9 30.4 29.8 30.1 30.1  MCHC 31.9 32.7 31.7 32.0 32.3  RDW 12.9 12.7 12.7 12.7 12.7  LYMPHSABS  --   --   --  1.5 1.2  MONOABS  --   --   --  0.7 0.7  EOSABS  --   --   --  0.2 0.1  BASOSABS  --   --   --  0.0 0.0    Chemistries  Recent Labs  Lab 10/16/18 1532  10/17/18 0649 10/18/18 0640 10/19/18 0607 10/20/18 1258 10/23/18 0513  NA 139  --  140 137 137  --   --   K 3.4*  --  3.4* 4.1 3.8  --   --   CL 105  --  108 107 105  --   --   CO2 23  --  22 21* 20*  --   --   GLUCOSE 95  --  91 98 86  --   --   BUN 15  --  13 16 15   --   --   CREATININE 1.56*   < > 1.37* 1.30* 1.25* 1.32* 1.03*  CALCIUM 8.8*  --  8.6* 9.1 9.2  --   --   MG  --   --   --  1.8  --   --   --   AST  --   --   --  12*  --   --   --   ALT  --   --   --  9  --   --   --  ALKPHOS  --   --   --  60  --   --   --   BILITOT  --   --   --  0.4  --   --   --    < > = values in this interval not displayed.   ------------------------------------------------------------------------------------------------------------------ No results for input(s): CHOL, HDL, LDLCALC, TRIG, CHOLHDL, LDLDIRECT in the last 72 hours.  No results found for: HGBA1C ------------------------------------------------------------------------------------------------------------------ No results for input(s): TSH, T4TOTAL, T3FREE, THYROIDAB in the last 72 hours.  Invalid input(s): FREET3 ------------------------------------------------------------------------------------------------------------------ No results for input(s): VITAMINB12, FOLATE, FERRITIN, TIBC, IRON, RETICCTPCT in the last 72 hours.  Coagulation profile Recent Labs  Lab 10/20/18 0535  INR 1.0    No results for input(s): DDIMER in the last 72 hours.  Cardiac Enzymes No results for input(s): CKMB, TROPONINI, MYOGLOBIN in the last 168 hours.  Invalid input(s):  CK ------------------------------------------------------------------------------------------------------------------ No results found for: BNP  Inpatient Medications  Scheduled Meds:  atorvastatin  10 mg Oral QPM   clonazePAM  0.5 mg Oral BID   donepezil  10 mg Oral QHS   enoxaparin (LOVENOX) injection  40 mg Subcutaneous QHS   escitalopram  10 mg Oral QHS   ferrous sulfate  325 mg Oral Q breakfast   fesoterodine  4 mg Oral Daily   influenza vaccine adjuvanted  0.5 mL Intramuscular Tomorrow-1000   Melatonin  5 mg Oral QHS   memantine  7 mg Oral Daily   nystatin-triamcinolone  1 application Topical TID   pantoprazole  40 mg Oral Daily   primidone  50 mg Oral Daily   traZODone  50 mg Oral QHS   Continuous Infusions:  sodium chloride 50 mL/hr at 10/22/18 0445   PRN Meds:.acetaminophen, ketorolac, oxyCODONE-acetaminophen  Micro Results Recent Results (from the past 240 hour(s))  Urine culture     Status: None   Collection Time: 10/16/18  7:26 PM   Specimen: Urine, Random  Result Value Ref Range Status   Specimen Description   Final    URINE, RANDOM Performed at [facility], [place] [address], [place], [place] [address]    Special Requests   Final    NONE Performed at [facility], [place] [address], [place], [place] [address]    Culture   Final    Multiple bacterial morphotypes present, none predominant. Suggest appropriate recollection if clinically indicated.   Report Status 10/18/2018 FINAL  Final  SARS Coronavirus 2 [facility] order, Performed in [facility] hospital lab) Nasopharyngeal Nasopharyngeal Swab     Status: None   Collection Time: 10/16/18  9:07 PM   Specimen: Nasopharyngeal Swab  Result Value Ref Range Status   SARS Coronavirus 2 NEGATIVE NEGATIVE Final    Comment: (NOTE) If result is NEGATIVE SARS-CoV-2 target nucleic acids are NOT DETECTED. The SARS-CoV-2 RNA is generally detectable in upper and lower   respiratory specimens during the acute phase of infection. The lowest  concentration of SARS-CoV-2 viral copies this assay can detect is 250  copies / mL. A negative result does not preclude SARS-CoV-2 infection  and should not be used as the sole basis for treatment or other  patient management decisions.  A negative result may occur with  improper specimen collection / handling, submission of specimen other  than nasopharyngeal swab, presence of viral mutation(s) within the  areas targeted by this assay, and inadequate number of viral copies  (<250 copies / mL). A negative result must be combined with clinical  observations, patient history, and epidemiological information. If result is POSITIVE SARS-CoV-2 target nucleic acids are DETECTED. The SARS-CoV-2 RNA is generally detectable in upper and lower  respiratory specimens dur ing the acute phase of infection.  Positive  results are indicative of active infection with SARS-CoV-2.  Clinical  correlation with patient history and other diagnostic information is  necessary to determine patient infection status.  Positive results do  not rule out bacterial infection or co-infection with other viruses. If result is PRESUMPTIVE POSTIVE SARS-CoV-2 nucleic acids MAY BE PRESENT.   A presumptive positive result was obtained on the submitted specimen  and confirmed on repeat testing.  While 2019 novel coronavirus  (SARS-CoV-2) nucleic acids may be present in the submitted sample  additional confirmatory testing may be necessary for epidemiological  and / or clinical management purposes  to differentiate between  SARS-CoV-2 and other Sarbecovirus currently known to infect humans.  If clinically indicated additional testing with an alternate test  methodology [phone]) is advised. The SARS-CoV-2 RNA is generally  detectable in upper and lower respiratory sp ecimens during the acute  phase of infection. The expected result is Negative. Fact  Sheet for Patients:  StrictlyIdeas.no Fact Sheet for Healthcare Providers: BankingDealers.co.za This test is not yet approved or cleared by the [place] FDA and has been authorized for detection and/or diagnosis of SARS-CoV-2 by FDA under an Emergency Use Authorization (EUA).  This EUA will remain in effect (meaning this test can be used) for the duration of the COVID-19 declaration under Section 564(b)(1) of the Act, 21 U.S.C. section 360bbb-3(b)(1), unless the authorization is terminated or revoked sooner. Performed at [facility], [place] [address], [place], [place] [address]     Radiology Reports Ct Pelvis W Contrast  Result Date: 10/19/2018 CLINICAL DATA:  73 year old female with burning in the region of the inner thigh. EXAM: CT PELVIS WITH CONTRAST TECHNIQUE: Multidetector CT imaging of the pelvis was performed using the standard protocol following the bolus administration of intravenous contrast. CONTRAST:  20mL OMNIPAQUE IOHEXOL 300 MG/ML  SOLN COMPARISON:  CT of the abdomen pelvis dated 09/23/2018 FINDINGS: Urinary Tract: There is dilatation of the visualized right ureter likely secondary to obstruction by the right adnexal/pelvic mass as seen on the CT of 09/23/2018. The visualized left ureter appears unremarkable. Bowel: Rectal contrast is noted. There is sigmoid diverticulosis without active inflammatory changes. There is tethering of multiple loops of small bowel to the right adnexal mass consistent with adhesions there is mild dilatation of small-bowel loops in the right hemipelvis measuring up to 3.5 cm indicative of a degree obstruction. Evidence of prior small bowel surgery with anastomotic suture in the left hemipelvis. Vascular/Lymphatic: The abdominal aorta and IVC are grossly unremarkable. No adenopathy. Reproductive: Hysterectomy. There is a 4.9 x 4.6 x 5.0 cm soft tissue mass in the region the right adnexa  contiguous with the vaginal cuff. There is complex fluid or air and debris within the center of this mass likely extending from the vagina. There is irregularity and nodularity of the border of these mass with desmoplastic changes and tethering of the adjacent small bowel loops. Other: None no perirectal abscess or inflammatory changes. No fluid collection or soft tissue air. Musculoskeletal: Osteopenia. No acute osseous pathology. Left S2-S3 Tarlov cyst. IMPRESSION: 1. No fluid collection, abscess or soft tissue air in the perirectal region or medial thighs. 2. Right adnexal soft tissue contiguous with the vaginal cuff similar to prior CT. There is associated tethering of the  adjacent small bowel loops with probable degree of small-bowel obstruction as well as obstruction of the distal right ureter. MRI may provide better evaluation of this mass. Electronically Signed   By: Anner Crete M.D.   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 CONTRAST:  74mL OMNIPAQUE IOHEXOL 300 MG/ML SOLN, 92mL OMNIPAQUE IOHEXOL 300 MG/ML SOLN COMPARISON:  CT, 09/23/2018 FINDINGS: Lower chest: Linear atelectasis at the lung bases.  Acute findings. Hepatobiliary: Leretha Dykes of the liver excluded from the field of view. Liver normal in overall size and attenuation. 1.5 cm low-density lesion, segment 4 B. 1.0 cm low-density lesion, segment 2. These are stable consistent with cysts. Subtle low-density lesion noted near the liver dome, segment 7, also likely a cyst.  This was more defined as a cyst on the prior CT. No other liver lesions. Status post cholecystectomy. No bile duct dilation. Pancreas: Unremarkable. No pancreatic ductal dilatation or surrounding inflammatory changes. Spleen: Normal in size without focal abnormality. Adrenals/Urinary Tract: No adrenal masses. Right ureter is obstructed an irregular mass along right adnexa. This causes marked right hydronephrosis and moderate right hydroureter. No left hydronephrosis. Left ureter normal in course and in caliber. Stable lower pole left renal cyst. Significant right renal cortical thinning. There is less enhancement of the right kidney compared to the left as well as delayed excretion on the right. Renal findings are stable from prior CT. Stomach/Bowel: There is an irregular mass that appears centered on the upper cecum and lower ascending colon, extending into the right adnexal soft tissues abutting the right superior vaginal cuff and pelvic loops of small bowel. Mass measures approximately 6.2 x 4.2 cm transversely by 6.1 cm superior to inferior. There is a projection of contrast, from what appears to be the cecum lying in the inferior pelvis, extending into the right pelvic mass. There is also a small bubble of air within the right pelvic mass. A small amount of contrast and a single bubble of air extends within the mass to the right vaginal cuff consistent with a fistula. There are numerous sigmoid colon diverticula without evidence of diverticulitis. No other bowel masses. No wall thickening and no inflammation. There is mild distention loops of mid to distal small bowel with air-fluid levels. No small bowel wall thickening or adjacent inflammation. Stomach is unremarkable. Vascular/Lymphatic: No discrete enlarged lymph nodes. Aortic atherosclerosis. Reproductive: Status post hysterectomy. Other: No hernia.  No ascites. Musculoskeletal: No fracture or acute finding. No osteoblastic or osteolytic lesions.  IMPRESSION: 1. No significant change when compared to the previous CT scan. Bowel contrast better defines the right pelvic mass. 2. Some contrast as well as a small amount of air lies within the mass. This appears extraluminal. The defect or fistula appears to arise from the right side of the cecum, which lies in the inferior pelvis. 3. The right pelvic mass appears centered on the upper cecum and lower ascending colon, consistent a colon carcinoma. Mass is contiguous with the right vaginal cuff. There is a small amount of contrast and a small bubble of air that extends to the  right vaginal cuff from the pelvic mass consistent with a fistula. 4. Mass measures approximately 6.2 x 4.2 x 6.1 cm. 5. No evidence of metastatic disease. Electronically Signed   By: Lajean Manes M.D.   On: 10/20/2018 16:06   Ct Abdomen Pelvis W Contrast  Result Date: 09/23/2018 CLINICAL DATA:  73 year old with vaginal bleeding.  Abdominal pain. EXAM: CT ABDOMEN AND PELVIS WITH CONTRAST TECHNIQUE: Multidetector CT imaging of the abdomen and pelvis was performed using the standard protocol following bolus administration of intravenous contrast. CONTRAST:  15mL OMNIPAQUE IOHEXOL 300 MG/ML  SOLN COMPARISON:  None. FINDINGS: Lower chest: Linear atelectasis or scarring in both lower lobes. Trace right pleural thickening/minimal pleural fluid. Hepatobiliary: 16 mm cyst in the left lobe of the liver, 11 mm cyst in the left hepatic dome. No suspicious hepatic lesion. Possible vague 10 mm lesion in the anterior right hepatic dome, image 9 series 2, does not represent cyst. Clips in the gallbladder fossa postcholecystectomy. No biliary dilatation. Pancreas: No ductal dilatation or inflammation. Spleen: Normal in size without focal abnormality. Splenule at the hilum. Adrenals/Urinary Tract: Subcentimeter left adrenal nodularity. Right adrenal gland is normal. Severe right hydronephrosis with associated severe cortical thinning. There is also right  ureteral dilatation to the pelvis at site of ill-defined pelvic mass. No perinephric edema. Absent excretion on delayed phase imaging. Parapelvic cyst in the left kidney without left hydronephrosis. Additional smaller cortical cysts in the left kidney. Urinary bladder is partially distended. Stomach/Bowel: Bowel evaluation is limited in the absence of enteric contrast. Tiny hiatal hernia. Stomach is decompressed. Proximal small bowel are unremarkable. Fluid-filled nondilated pelvic small bowel loops. Pelvic bowel loops in the right adnexa are tethered and contiguous with and cannot be delineated from ill-defined adnexal mass that is contiguous with the right aspect of the vaginal cuff. High-riding cecum in the right mid abdomen. Liquid stool in the proximal colon with formed stool distally. There is colonic tortuosity. Enteric sutures in the pelvis. Distal colonic diverticulosis without diverticulitis. Vascular/Lymphatic: Mild aortic atherosclerosis without aneurysm. Portal vein is patent. No enlarged abdominal or pelvic lymph nodes. Reproductive: Post hysterectomy. There is ill-defined soft tissue density contiguous with the vaginal cuff extending 4.5 cm cranially. Right adnexal mass which is difficult to delineate from adjacent and contiguous bowel, series 2, image 61. Other: No significant free fluid.  No free air. Musculoskeletal: Hemangioma within T12 vertebral body. There are no acute or suspicious osseous abnormalities. IMPRESSION: 1. Post hysterectomy. Ill-defined right adnexal mass which is contiguous with the right aspect of the vaginal cuff, difficult to delineate size due to adjacent bowel loops. This measures at least 4.5 cm in cranial caudal dimension. Adjacent small bowel loops tethered. This lesion obstructs the right ureter with severe right hydroureteronephrosis and renal cortical thinning. 2. Multiple hepatic cysts. Additional vague 10 mm hypodense lesion in the right dome is too small to  characterize. 3. Mild colonic diverticulosis without diverticulitis. 4.  Aortic Atherosclerosis (ICD10-I70.0). Electronically Signed   By: Keith Rake M.D.   On: 09/23/2018 22:04   Ct Biopsy  Result Date: 10/20/2018 INDICATION: GYN malignancy, right vaginal cuff mass with possible adjacent fistula EXAM: CT-GUIDED CORE BIOPSY RIGHT VAGINAL CUFF MASS MEDICATIONS: 1% LIDOCAINE LOCAL ANESTHESIA/SEDATION: 2.0 mg IV Versed; 50 mcg IV Fentanyl Moderate Sedation Time:  12 minutes The patient was continuously monitored during the procedure by the interventional radiology nurse under my direct supervision. PROCEDURE: The procedure, risks, benefits, and alternatives were explained to the patient. Questions regarding the procedure were  encouraged and answered. The patient understands and consents to the procedure. Previous imaging reviewed. Patient position prone. Noncontrast localization CT performed. The right vaginal soft tissue mass was localized and marked for a posterior trans gluteal approach. Under sterile conditions and local anesthesia, a 17 gauge coaxial guide needle was advanced from right posterior trans gluteal approach to the right vaginal cuff mass. Needle position confirmed with CT. 3 18 gauge core biopsies obtained. Samples were intact and non fragmented. These were placed in formalin. Needle removed. Postprocedure imaging demonstrates hematoma. Patient tolerated the procedure well without complication. Vital sign monitoring by nursing staff during the procedure will continue as patient is in the special procedures unit for post procedure observation. FINDINGS: The images document guide needle placement within the right vaginal cuff mass. Post biopsy images demonstrate no hemorrhage or hematoma. COMPLICATIONS: None immediate. IMPRESSION: Successful CT-guided core biopsy of the right vaginal cuff mass Electronically Signed   By: Jerilynn Mages.  Shick M.D.   On: 10/20/2018 09:38    Time Spent in minutes   25   Baer Hinton M.D on 10/23/2018 at 12:54 PM  Between 7am to 7pm - Pager - [phone]  After 7pm go to [email or web address] - password [email or web address]  [facility] -  Office  [phone]

## 2018-10-24 LAB — SURGICAL PATHOLOGY

## 2018-10-24 NOTE — Care Management Important Message (Signed)
Important Message  Patient Details IM Letter given to Sharren Bridge SW to present to the Patient Name: Alyssa Macdonald MRN: [id number] Date of Birth: [birth year]   Medicare Important Message Given:  Yes     Kerin Salen 10/24/2018, 11:23 AM

## 2018-10-24 NOTE — Progress Notes (Signed)
PROGRESS NOTE                                                                                                                                                                                                             Patient Demographics:    Alyssa Macdonald, is a 73 y.o. female, DOB - March 15, 1945, [id number]  Admit date - 10/16/2018   Admitting Physician Orene Desanctis, DO  Outpatient Primary MD for the patient is Bartholome Bill, MD  LOS - 7    Chief Complaint  Patient presents with   Vaginal Prolapse       Brief Narrative   73 year old female with Alzheimer's dementia, cervical dystonia with recently diagnosed rectovaginal fistula presented with severe vaginal burning.  She was diagnosed with a new right adnexal mass in August 2020 which was contiguous with the right aspect of her vaginal cuff.  She was discharged from the ED with plan on outpatient GYN oncology but she did not have an appointment until later this week. In the ED blood work showed drop in hemoglobin to 11.5 from 14.4 and chemistry showed mild AKI.  GYN oncology and CCS consulted.  Subjective:   Denies vaginal pain today.  Assessment  & Plan :   Rectovaginal fistula with vaginal irritation/pain Surgery and GYN oncology following.  CT biopsy of the pelvic mass done on 9/17.  Shows high-grade carcinoma (cannot rule out peritoneal carcinoma) CT of the abdomen done on 9/17 showed cecal mass communicating with the vaginal cuff consistent with a fistula. Earlier planned for surgery but upon re-evaluation by surgery again today recommend patient will require extensive surgical resection of the mass,, with resection of small bowel, cecum, nephrectomy and cystectomy.  Also does not recommend diversion as she would need proximal ostomy with risk for high output.  Recommend radiation oncology consult for palliative radiation.    Pain not well controlled on  current regimen of Percocet.  Will add PRN tramadol.  Active problems ?  UTI Received empiric Rocephin.  Hypotension and bradycardia discontinue propanolol.  Heart rate stable.  Chronic depression and anxiety On Lexapro and Klonopin.  Alzheimer's dementia,?  Moderate Resident of SNF.  Home meds continued  Iron deficiency/acute blood loss anemia Started on iron supplement  Hypokalemia Replenished  Physical deconditioning PT recommends SNF   Code Status : DNR  Family Communication  : none.   Disposition Plan  : Plan on SNF after radiation oncology consult and further recommendation per surgery and oncology.  Barriers For Discharge : Active symptoms  Consults  : CCS, oncology  Procedures CT abdomen pelvis, CT biopsy  DVT Prophylaxis  :  Lovenox -  Lab Results  Component Value Date   PLT 344 10/19/2018    Antibiotics  :   Anti-infectives (From admission, onward)   Start     Dose/Rate Route Frequency Ordered Stop   10/17/18 2200  cefTRIAXone (ROCEPHIN) 1 g in sodium chloride 0.9 % 100 mL IVPB  Status:  Discontinued     1 g 200 mL/hr over 30 Minutes Intravenous Every 24 hours 10/16/18 2046 10/20/18 1258   10/16/18 2015  cefTRIAXone (ROCEPHIN) 1 g in sodium chloride 0.9 % 100 mL IVPB     1 g 200 mL/hr over 30 Minutes Intravenous  Once 10/16/18 2000 10/16/18 2157        Objective:   Vitals:   10/23/18 0559 10/23/18 1512 10/23/18 2033 10/24/18 0539  BP: 133/65 119/65 128/64 (!) 108/57  Pulse: (!) 55 64 67 68  Resp: 16 14 16 17   Temp: (!) 97.5 F (36.4 C) 97.8 F (36.6 C) 98.3 F (36.8 C) 98 F (36.7 C)  TempSrc: Oral  Oral Oral  SpO2: 100% 99% 97% 100%  Weight:      Height:        Wt Readings from Last 3 Encounters:  10/16/18 86.2 kg  08/17/17 81.8 kg  05/31/17 78.5 kg     Intake/Output Summary (Last 24 hours) at 10/24/2018 1124 Last data filed at 10/24/2018 0900 Gross per 24 hour  Intake 3473.96 ml  Output --  Net 3473.96 ml   Physical  exam Not in distress HEENT: Moist mucosa Chest: Clear CVs: Normal S1-S2 GI: Soft, nondistended, nontender, no vaginal discharge noted Musculoskeletal: Warm, no edema     Data Review:    CBC Recent Labs  Lab 10/18/18 0640 10/19/18 0607  WBC 7.6 8.5  HGB 11.0* 11.2*  HCT 34.4* 34.7*  PLT 314 344  MCV 94.0 93.3  MCH 30.1 30.1  MCHC 32.0 32.3  RDW 12.7 12.7  LYMPHSABS 1.5 1.2  MONOABS 0.7 0.7  EOSABS 0.2 0.1  BASOSABS 0.0 0.0    Chemistries  Recent Labs  Lab 10/18/18 0640 10/19/18 0607 10/20/18 1258 10/23/18 0513  NA 137 137  --   --   K 4.1 3.8  --   --   CL 107 105  --   --   CO2 21* 20*  --   --   GLUCOSE 98 86  --   --   BUN 16 15  --   --   CREATININE 1.30* 1.25* 1.32* 1.03*  CALCIUM 9.1 9.2  --   --   MG 1.8  --   --   --   AST 12*  --   --   --   ALT 9  --   --   --   ALKPHOS 60  --   --   --   BILITOT 0.4  --   --   --    ------------------------------------------------------------------------------------------------------------------ No results for input(s): CHOL, HDL, LDLCALC, TRIG, CHOLHDL, LDLDIRECT in the last 72 hours.  No results found for: HGBA1C ------------------------------------------------------------------------------------------------------------------ No results for input(s): TSH, T4TOTAL, T3FREE, THYROIDAB in the last 72 hours.  Invalid input(s): FREET3 ------------------------------------------------------------------------------------------------------------------ No results for input(s): VITAMINB12, FOLATE, FERRITIN, TIBC,  IRON, RETICCTPCT in the last 72 hours.  Coagulation profile Recent Labs  Lab 10/20/18 0535  INR 1.0    No results for input(s): DDIMER in the last 72 hours.  Cardiac Enzymes No results for input(s): CKMB, TROPONINI, MYOGLOBIN in the last 168 hours.  Invalid input(s): CK ------------------------------------------------------------------------------------------------------------------ No results  found for: BNP  Inpatient Medications  Scheduled Meds:  atorvastatin  10 mg Oral QPM   clonazePAM  0.5 mg Oral BID   donepezil  10 mg Oral QHS   enoxaparin (LOVENOX) injection  40 mg Subcutaneous QHS   escitalopram  10 mg Oral QHS   ferrous sulfate  325 mg Oral Q breakfast   fesoterodine  4 mg Oral Daily   influenza vaccine adjuvanted  0.5 mL Intramuscular Tomorrow-1000   Melatonin  5 mg Oral QHS   memantine  7 mg Oral Daily   nystatin-triamcinolone  1 application Topical TID   pantoprazole  40 mg Oral Daily   primidone  50 mg Oral Daily   traZODone  50 mg Oral QHS   Continuous Infusions:  sodium chloride 50 mL/hr at 10/23/18 1600   PRN Meds:.acetaminophen, ketorolac, oxyCODONE-acetaminophen  Micro Results Recent Results (from the past 240 hour(s))  Urine culture     Status: None   Collection Time: 10/16/18  7:26 PM   Specimen: Urine, Random  Result Value Ref Range Status   Specimen Description   Final    URINE, RANDOM Performed at [facility], [place] [address] [place], [place] [address]    Special Requests   Final    NONE Performed at [facility], [place] [address] [place], [place] [address]    Culture   Final    Multiple bacterial morphotypes present, none predominant. Suggest appropriate recollection if clinically indicated.   Report Status 10/18/2018 FINAL  Final  SARS Coronavirus 2 [facility] order, Performed in [facility] hospital lab) Nasopharyngeal Nasopharyngeal Swab     Status: None   Collection Time: 10/16/18  9:07 PM   Specimen: Nasopharyngeal Swab  Result Value Ref Range Status   SARS Coronavirus 2 NEGATIVE NEGATIVE Final    Comment: (NOTE) If result is NEGATIVE SARS-CoV-2 target nucleic acids are NOT DETECTED. The SARS-CoV-2 RNA is generally detectable in upper and lower  respiratory specimens during the acute phase of infection. The lowest  concentration of SARS-CoV-2 viral copies this assay can  detect is 250  copies / mL. A negative result does not preclude SARS-CoV-2 infection  and should not be used as the sole basis for treatment or other  patient management decisions.  A negative result may occur with  improper specimen collection / handling, submission of specimen other  than nasopharyngeal swab, presence of viral mutation(s) within the  areas targeted by this assay, and inadequate number of viral copies  (<250 copies / mL). A negative result must be combined with clinical  observations, patient history, and epidemiological information. If result is POSITIVE SARS-CoV-2 target nucleic acids are DETECTED. The SARS-CoV-2 RNA is generally detectable in upper and lower  respiratory specimens dur ing the acute phase of infection.  Positive  results are indicative of active infection with SARS-CoV-2.  Clinical  correlation with patient history and other diagnostic information is  necessary to determine patient infection status.  Positive results do  not rule out bacterial infection or co-infection with other viruses. If result is PRESUMPTIVE POSTIVE SARS-CoV-2 nucleic acids MAY BE PRESENT.   A presumptive positive result was obtained on the  submitted specimen  and confirmed on repeat testing.  While 2019 novel coronavirus  (SARS-CoV-2) nucleic acids may be present in the submitted sample  additional confirmatory testing may be necessary for epidemiological  and / or clinical management purposes  to differentiate between  SARS-CoV-2 and other Sarbecovirus currently known to infect humans.  If clinically indicated additional testing with an alternate test  methodology [phone]) is advised. The SARS-CoV-2 RNA is generally  detectable in upper and lower respiratory sp ecimens during the acute  phase of infection. The expected result is Negative. Fact Sheet for Patients:  StrictlyIdeas.no Fact Sheet for Healthcare  Providers: BankingDealers.co.za This test is not yet approved or cleared by the [place] FDA and has been authorized for detection and/or diagnosis of SARS-CoV-2 by FDA under an Emergency Use Authorization (EUA).  This EUA will remain in effect (meaning this test can be used) for the duration of the COVID-19 declaration under Section 564(b)(1) of the Act, 21 U.S.C. section 360bbb-3(b)(1), unless the authorization is terminated or revoked sooner. Performed at [facility], [place] [address], [place], [place] [address]     Radiology Reports Ct Pelvis W Contrast  Result Date: 10/19/2018 CLINICAL DATA:  73 year old female with burning in the region of the inner thigh. EXAM: CT PELVIS WITH CONTRAST TECHNIQUE: Multidetector CT imaging of the pelvis was performed using the standard protocol following the bolus administration of intravenous contrast. CONTRAST:  36mL OMNIPAQUE IOHEXOL 300 MG/ML  SOLN COMPARISON:  CT of the abdomen pelvis dated 09/23/2018 FINDINGS: Urinary Tract: There is dilatation of the visualized right ureter likely secondary to obstruction by the right adnexal/pelvic mass as seen on the CT of 09/23/2018. The visualized left ureter appears unremarkable. Bowel: Rectal contrast is noted. There is sigmoid diverticulosis without active inflammatory changes. There is tethering of multiple loops of small bowel to the right adnexal mass consistent with adhesions there is mild dilatation of small-bowel loops in the right hemipelvis measuring up to 3.5 cm indicative of a degree obstruction. Evidence of prior small bowel surgery with anastomotic suture in the left hemipelvis. Vascular/Lymphatic: The abdominal aorta and IVC are grossly unremarkable. No adenopathy. Reproductive: Hysterectomy. There is a 4.9 x 4.6 x 5.0 cm soft tissue mass in the region the right adnexa contiguous with the vaginal cuff. There is complex fluid or air and debris within the  center of this mass likely extending from the vagina. There is irregularity and nodularity of the border of these mass with desmoplastic changes and tethering of the adjacent small bowel loops. Other: None no perirectal abscess or inflammatory changes. No fluid collection or soft tissue air. Musculoskeletal: Osteopenia. No acute osseous pathology. Left S2-S3 Tarlov cyst. IMPRESSION: 1. No fluid collection, abscess or soft tissue air in the perirectal region or medial thighs. 2. Right adnexal soft tissue contiguous with the vaginal cuff similar to prior CT. There is associated tethering of the adjacent small bowel loops with probable degree of small-bowel obstruction as well as obstruction of the distal right ureter. MRI may provide better evaluation of this mass. Electronically Signed   By: Anner Crete M.D.   On: 10/19/2018 16:20   Ct Abdomen Pelvis W Contrast  Result Date: 10/20/2018 CLINICAL DATA:  Right pelvic mass with involvement of small intestine, vaginal cuff fistula CT guided biopsy of mass ordered and pending Imaging options discussed with radiology regarding evaluation of source of fistula - small bowel vs colon EXAM: CT ABDOMEN AND PELVIS WITH CONTRAST TECHNIQUE: Multidetector CT imaging of  the abdomen and pelvis was performed using the standard protocol following bolus administration of intravenous contrast. CONTRAST:  52mL OMNIPAQUE IOHEXOL 300 MG/ML SOLN, 60mL OMNIPAQUE IOHEXOL 300 MG/ML SOLN COMPARISON:  CT, 09/23/2018 FINDINGS: Lower chest: Linear atelectasis at the lung bases.  Acute findings. Hepatobiliary: Leretha Dykes of the liver excluded from the field of view. Liver normal in overall size and attenuation. 1.5 cm low-density lesion, segment 4 B. 1.0 cm low-density lesion, segment 2. These are stable consistent with cysts. Subtle low-density lesion noted near the liver dome, segment 7, also likely a cyst. This was more defined as a cyst on the prior CT. No other liver lesions. Status post  cholecystectomy. No bile duct dilation. Pancreas: Unremarkable. No pancreatic ductal dilatation or surrounding inflammatory changes. Spleen: Normal in size without focal abnormality. Adrenals/Urinary Tract: No adrenal masses. Right ureter is obstructed an irregular mass along right adnexa. This causes marked right hydronephrosis and moderate right hydroureter. No left hydronephrosis. Left ureter normal in course and in caliber. Stable lower pole left renal cyst. Significant right renal cortical thinning. There is less enhancement of the right kidney compared to the left as well as delayed excretion on the right. Renal findings are stable from prior CT. Stomach/Bowel: There is an irregular mass that appears centered on the upper cecum and lower ascending colon, extending into the right adnexal soft tissues abutting the right superior vaginal cuff and pelvic loops of small bowel. Mass measures approximately 6.2 x 4.2 cm transversely by 6.1 cm superior to inferior. There is a projection of contrast, from what appears to be the cecum lying in the inferior pelvis, extending into the right pelvic mass. There is also a small bubble of air within the right pelvic mass. A small amount of contrast and a single bubble of air extends within the mass to the right vaginal cuff consistent with a fistula. There are numerous sigmoid colon diverticula without evidence of diverticulitis. No other bowel masses. No wall thickening and no inflammation. There is mild distention loops of mid to distal small bowel with air-fluid levels. No small bowel wall thickening or adjacent inflammation. Stomach is unremarkable. Vascular/Lymphatic: No discrete enlarged lymph nodes. Aortic atherosclerosis. Reproductive: Status post hysterectomy. Other: No hernia.  No ascites. Musculoskeletal: No fracture or acute finding. No osteoblastic or osteolytic lesions. IMPRESSION: 1. No significant change when compared to the previous CT scan. Bowel contrast  better defines the right pelvic mass. 2. Some contrast as well as a small amount of air lies within the mass. This appears extraluminal. The defect or fistula appears to arise from the right side of the cecum, which lies in the inferior pelvis. 3. The right pelvic mass appears centered on the upper cecum and lower ascending colon, consistent a colon carcinoma. Mass is contiguous with the right vaginal cuff. There is a small amount of contrast and a small bubble of air that extends to the right vaginal cuff from the pelvic mass consistent with a fistula. 4. Mass measures approximately 6.2 x 4.2 x 6.1 cm. 5. No evidence of metastatic disease. Electronically Signed   By: Lajean Manes M.D.   On: 10/20/2018 16:06   Ct Biopsy  Result Date: 10/20/2018 INDICATION: GYN malignancy, right vaginal cuff mass with possible adjacent fistula EXAM: CT-GUIDED CORE BIOPSY RIGHT VAGINAL CUFF MASS MEDICATIONS: 1% LIDOCAINE LOCAL ANESTHESIA/SEDATION: 2.0 mg IV Versed; 50 mcg IV Fentanyl Moderate Sedation Time:  12 minutes The patient was continuously monitored during the procedure by the interventional radiology nurse under my  direct supervision. PROCEDURE: The procedure, risks, benefits, and alternatives were explained to the patient. Questions regarding the procedure were encouraged and answered. The patient understands and consents to the procedure. Previous imaging reviewed. Patient position prone. Noncontrast localization CT performed. The right vaginal soft tissue mass was localized and marked for a posterior trans gluteal approach. Under sterile conditions and local anesthesia, a 17 gauge coaxial guide needle was advanced from right posterior trans gluteal approach to the right vaginal cuff mass. Needle position confirmed with CT. 3 18 gauge core biopsies obtained. Samples were intact and non fragmented. These were placed in formalin. Needle removed. Postprocedure imaging demonstrates hematoma. Patient tolerated the procedure  well without complication. Vital sign monitoring by nursing staff during the procedure will continue as patient is in the special procedures unit for post procedure observation. FINDINGS: The images document guide needle placement within the right vaginal cuff mass. Post biopsy images demonstrate no hemorrhage or hematoma. COMPLICATIONS: None immediate. IMPRESSION: Successful CT-guided core biopsy of the right vaginal cuff mass Electronically Signed   By: Jerilynn Mages.  Shick M.D.   On: 10/20/2018 09:38    Time Spent in minutes  25   Allysen Lazo M.D on 10/24/2018 at 11:24 AM  Between 7am to 7pm - Pager - [phone]  After 7pm go to [email or web address] - password [email or web address]  [facility] -  Office  [phone]

## 2018-10-24 NOTE — Progress Notes (Addendum)
Physical Therapy Treatment Patient Details Name: Alyssa Macdonald MRN: [id number] DOB: 11/21/1945 Today's Date: 10/24/2018    History of Present Illness 73 y.o.female with medical history significant of Alzheimer's dementia, cervical dystonia, hysterectomy, bil TKAs, HTN, essential tremor,  recent diagnosis of rectovaginal fistula who presented with complaints of severe vaginal burning.    PT Comments    Progressing with mobility.    Follow Up Recommendations  SNF     Equipment Recommendations  Rolling walker with 5" wheels    Recommendations for Other Services       Precautions / Restrictions Precautions Precautions: Fall Restrictions Weight Bearing Restrictions: No    Mobility  Bed Mobility Overal bed mobility: Needs Assistance Bed Mobility: Supine to Sit     Supine to sit: Min guard     General bed mobility comments: close guard for safety. no assist given.  Transfers Overall transfer level: Needs assistance Equipment used: Rolling walker (2 wheeled) Transfers: Sit to/from Stand Sit to Stand: Min guard         General transfer comment: close guard for safety. vcs safety, hand placement.  Ambulation/Gait Ambulation/Gait assistance: Min guard Gait Distance (Feet): 125 Feet Assistive device: Rolling walker (2 wheeled) Gait Pattern/deviations: Step-through pattern;Decreased stride length     General Gait Details: close guard for safety. slow gait speed. pt reported feeling fatigued, weak, and less steady.   Stairs             Wheelchair Mobility    Modified Rankin (Stroke Patients Only)       Balance Overall balance assessment: Needs assistance         Standing balance support: Bilateral upper extremity supported Standing balance-Leahy Scale: Poor                              Cognition Arousal/Alertness: Awake/alert Behavior During Therapy: WFL for tasks assessed/performed Overall Cognitive Status: History of cognitive  impairments - at baseline                                        Exercises      General Comments        Pertinent Vitals/Pain Pain Assessment: Faces Faces Pain Scale: Hurts a little bit Pain Location: vaginal/rectal Pain Descriptors / Indicators: Sore;Discomfort Pain Intervention(s): Monitored during session;Repositioned    Home Living                      Prior Function            PT Goals (current goals can now be found in the care plan section) Progress towards PT goals: Progressing toward goals    Frequency    Min 2X/week      PT Plan Current plan remains appropriate    Co-evaluation              AM-PAC PT "6 Clicks" Mobility   Outcome Measure  Help needed turning from your back to your side while in a flat bed without using bedrails?: A Little Help needed moving from lying on your back to sitting on the side of a flat bed without using bedrails?: A Little Help needed moving to and from a bed to a chair (including a wheelchair)?: A Little Help needed standing up from a chair using your arms (e.g., wheelchair or bedside chair)?: A  Little Help needed to walk in hospital room?: A Little Help needed climbing 3-5 steps with a railing? : A Little 6 Click Score: 18    End of Session Equipment Utilized During Treatment: Gait belt Activity Tolerance: Patient tolerated treatment well Patient left: in chair;with call bell/phone within reach;with chair alarm set Made NT aware that pt is sitting up in chair PT Visit Diagnosis: Unsteadiness on feet (R26.81);Muscle weakness (generalized) (M62.81)     Time: [id number] PT Time Calculation (min) (ACUTE ONLY): 14 min  Charges:  $Gait Training: 8-22 mins                        Weston Anna, PT Acute Rehabilitation Services Pager: [phone] Office: [phone]

## 2018-10-24 NOTE — Progress Notes (Signed)
[facility] Progress Note     Subjective: CC-  No complaints this morning. Just finished eating breakfast. Denies abdominal pain. Denies noticing any vaginal pain or discharge.  CT guided biopsy performed yesterday, results pending. CT scan showed likely cecal mass abutting vaginal cuff and pelvic loops of small bowel with 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  Objective: Vital signs in last 24 hours: Temp:  [97.8 F (36.6 C)-98.3 F (36.8 C)] 98 F (36.7 C) (09/21 0539) Pulse Rate:  [64-68] 68 (09/21 0539) Resp:  [14-17] 17 (09/21 0539) BP: (108-128)/(57-65) 108/57 (09/21 0539) SpO2:  [97 %-100 %] 100 % (09/21 0539) Last BM Date: 10/23/18  Intake/Output from previous day: 09/20 0701 - 09/21 0700 In: 3114 [P.O.:100; I.V.:3014] Out: -  Intake/Output this shift: No intake/output data recorded.  PE: Gen:  Alert, NAD, pleasant HEENT: EOM's intact, pupils equal and round Pulm:  Rate and effort normal Abd: Soft, NT/ND Skin: warm and dry  Lab Results:  No results for input(s): WBC, HGB, HCT, PLT in the last 72 hours. BMET Recent Labs    10/23/18 0513  CREATININE 1.03*   PT/INR No results for input(s): LABPROT, INR in the last 72 hours. CMP     Component Value Date/Time   NA 137 10/19/2018 0607   K 3.8 10/19/2018 0607   CL 105 10/19/2018 0607   CO2 20 (L) 10/19/2018 0607   GLUCOSE 86 10/19/2018 0607   BUN 15 10/19/2018 0607   CREATININE 1.03 (H) 10/23/2018 0513   CREATININE 0.98 10/04/2012 0811   CALCIUM 9.2 10/19/2018 0607   PROT 5.5 (L) 10/18/2018 0640   ALBUMIN 3.3 (L) 10/18/2018 0640   AST 12 (L) 10/18/2018 0640   ALT 9 10/18/2018 0640   ALKPHOS 60 10/18/2018 0640   BILITOT 0.4 10/18/2018 0640   GFRNONAA 53 (L) 10/23/2018 0513   GFRNONAA 59 (L) 10/04/2012 0811   GFRAA >60 10/23/2018 0513   GFRAA 68 10/04/2012 0811   Lipase     Component Value Date/Time   LIPASE 37 09/23/2018 2027       Studies/Results: No results  found.  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  ID - rocephin 9/13>>9/17 FEN - HH diet VTE - SCDs, lovenox Foley - none Follow up - TBD  Plan: I have reviewed CT scan.  Pt would need an extensive surgery to remove this mass, including resection of small bowel, cecum, nephrectomy and cystectomy.  I do not think this is appropriate in the setting of her advanced age and  dementia.  I also don't believe a diversion would be of much benefit as there are multiple loops of small bowel in her pelvis and an ostomy proximal to all of this would be high output and lead to dehydration and renal failure.  I recommend barrier creams to perineum and possible radiation oncology consult for palliative radiation to the area.

## 2018-10-25 ENCOUNTER — Ambulatory Visit
Admit: 2018-10-25 | Discharge: 2018-10-25 | Disposition: A | Discharge status: Home / Self Care | Payer: Medicare Other | Attending: Radiation Oncology | Admitting: Radiation Oncology

## 2018-10-25 DIAGNOSIS — N898 Other specified noninflammatory disorders of vagina: Secondary | ICD-10-CM

## 2018-10-25 NOTE — Progress Notes (Signed)
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 female who admitted for management of what appears to be advanced colonic carcinoma. Her history is significant for dementia and s/p hysterectomy with BSO in 1990 for endometriosis. The patient presented to the ED with vaginal bleeding on 07/19/2018. She was treated for UTI and recommended follow up with GYN. She returned to the ED on 09/23/2018, again for vaginal bleeding, after nursing staff at the memory care unit noted a "knot in vaginal area." Abdomen/pelvis performed at that time showed: ill-defined right adnexal mass contiguous with the right aspect of the vaginal cuff, which obstructs the right ureter with severe right hydroureteronephrosis and renal cortical thinning; multiple hepatic cysts. She was scheduled for consultation with Dr. Denman George on 10/19/2018. She was again referred to the ED on 10/11/2018 for leakage of stool into the vagina. 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  She presented back to the ED on 10/16/2018 with skin changes, pain, and burning sensation to the area. She was noted to have worsening kidney function and UTI along with hydronephrosis and was hospitalized. Dr. Denman George met the patient in consultation in the hospital on 10/18/2018 and recommended biopsy of the pelvic mass and CT scan of the pelvis.   The patient underwent a biopsy of the right vaginal cuff mass on 10/20/2018 showing: high grade carcinoma. She was seen for surgical consultation, but she was found to not be a  good surgical candidate due to the extensive tumor spread. Surgery would involve resection of small bowel, cecum, nephrectomy, and cystectomy.  She has been kindly referred to me for consideration of palliative radiation therapy directed to this vaginal lesion.  PREVIOUS RADIATION THERAPY: No  PAST MEDICAL HISTORY:  Past Medical History:  Diagnosis Date   Alzheimer's disease ([place])    Cervical dystonia    Complication of anesthesia    slow to awaken   Depression    Endometriosis    s/p total hysterectomy   Esophageal reflux    Essential and other specified forms of tremor    GERD (gastroesophageal reflux disease)    Hyperlipidemia    Hypertension    Hypopotassemia    Osteoarthrosis involving, or with mention of more than one site, but not specified as generalized, site unspecified(715.80)    Osteoarthrosis involving, or with mention of more than one site, but not specified as generalized, site unspecified(715.80)    Other disorders of bone and cartilage(733.99)    Tremors of nervous system    Trigger finger     PAST SURGICAL HISTORY: Past Surgical History:  Procedure Laterality Date   APPENDECTOMY     CATARACT EXTRACTION Bilateral    OD 07/03/2015, OS 05/29/2015   CHOLECYSTECTOMY     COLON SURGERY     bowel resection   FOOT SURGERY     JOINT REPLACEMENT     bilateral   TONSILLECTOMY     TOTAL ABDOMINAL HYSTERECTOMY     TOTAL KNEE ARTHROPLASTY     Bilateral   TRIGGER FINGER RELEASE  02/01/2012   Procedure: RELEASE TRIGGER FINGER/A-1 PULLEY;  Surgeon: Jolyn Nap, MD;  Location: [facility];  Service: Orthopedics;  Laterality: Right;  RIGHT LONG FINGER  TRIGGER FINGER RELEASE   WISDOM TOOTH EXTRACTION      FAMILY HISTORY:  Family History  Problem Relation Age of Onset   CVA Mother    Alzheimer's disease Mother    Diabetes Father    Heart disease Father    Parkinson's disease Paternal Uncle    Cataracts Sister      Breast cancer Neg Hx     SOCIAL HISTORY:  Social History   Tobacco Use   Smoking status: Never Smoker   Smokeless tobacco: Never Used  Substance Use Topics   Alcohol use: Yes    Comment: Occasional,one glass of wine weekly   Drug use: No    ALLERGIES:  Allergies  Allergen Reactions   Zocor [Simvastatin] Other (See Comments)    Myalgia    MEDICATIONS:  No current facility-administered medications for this encounter.    No current outpatient medications on file.   Facility-Administered Medications Ordered in Other Encounters  Medication Dose Route Frequency Provider Last Rate Last Dose   0.9 %  sodium chloride infusion   Intravenous Continuous Tu, Ching T, DO 50 mL/hr at 10/24/18 1458     acetaminophen (TYLENOL) tablet 650 mg  650 mg Oral Q4H PRN Tu, Ching T, DO   650 mg at 10/22/18 1526   atorvastatin (LIPITOR) tablet 10 mg  10 mg Oral QPM Tu, Ching T, DO   10 mg at 10/25/18 1727   clonazePAM (KLONOPIN) tablet 0.5 mg  0.5 mg Oral BID Tu, Ching T, DO   0.5 mg at 10/25/18 0929   donepezil (ARICEPT) tablet 10 mg  10 mg Oral QHS Tu, Ching T, DO   10 mg at 10/24/18 2213   enoxaparin (LOVENOX) injection 40 mg  40 mg Subcutaneous QHS Allred, Darrell K, PA-C   40 mg at 10/24/18 2214   escitalopram (LEXAPRO) tablet 10 mg  10 mg Oral QHS Tu, Ching T, DO   10 mg at 10/24/18 2213   ferrous sulfate tablet 325 mg  325 mg Oral Q breakfast Kayleen Memos, DO   325 mg at 10/25/18 2446   fesoterodine (TOVIAZ) tablet 4 mg  4 mg Oral Daily Tu, Ching T, DO   4 mg at 10/25/18 2863   influenza vaccine adjuvanted (FLUAD) injection 0.5 mL  0.5 mL Intramuscular Tomorrow-1000 Rai, Ripudeep K, MD       ketorolac (TORADOL) 15 MG/ML injection 15 mg  15 mg Intravenous Q6H PRN Dhungel, Nishant, MD   15 mg at 10/24/18 1109   Melatonin TABS 5 mg  5 mg Oral QHS Tu, Ching T, DO   5 mg at 10/24/18 2213   memantine (NAMENDA XR) 24 hr capsule 7 mg  7 mg Oral Daily Tu, Ching T, DO   7 mg at  10/25/18 8177   nystatin-triamcinolone (MYCOLOG II) cream 1 application  1 application Topical TID Tu, Ching T, DO   1 application at 11/65/79 1543   oxyCODONE-acetaminophen (PERCOCET/ROXICET) 5-325 MG per tablet 1-2 tablet  1-2 tablet Oral Q6H PRN Schorr, Rhetta Mura, NP   2 tablet at 10/25/18 1528   pantoprazole (PROTONIX) EC tablet 40 mg  40 mg Oral Daily Tu, Ching T, DO   40 mg at 10/25/18 0930   primidone (MYSOLINE) tablet 50 mg  50 mg Oral Daily Tu, Ching T, DO   50 mg at 10/25/18 0930  traZODone (DESYREL) tablet 50 mg  50 mg Oral QHS Tu, Ching T, DO   50 mg at 10/24/18 2213    REVIEW OF SYSTEMS:  A 10+ POINT REVIEW OF SYSTEMS WAS OBTAINED including neurology, dermatology, psychiatry, cardiac, respiratory, lymph, extremities, GI, GU, musculoskeletal, constitutional, reproductive, HEENT.    PHYSICAL EXAM:  Vitals - 1 value per visit 5/46/5681  SYSTOLIC 275  DIASTOLIC 54  Pulse 58  Temperature 97.7  Respirations 18  Weight (lb)   Height   BMI   VISIT REPORT    General: Alert  in no acute distress, lying comfortably in her hospital bed,  responds appropriately to questions, very pleasant HEENT: Head is normocephalic. Extraocular movements are intact. Oropharynx is clear. Neck: Neck is supple, no palpable cervical or supraclavicular lymphadenopathy. Heart: Regular in rate and rhythm with no murmurs, rubs, or gallops. Chest: Clear to auscultation bilaterally, with no rhonchi, wheezes, or rales. Abdomen: Soft, nontender, nondistended, with no rigidity or guarding. Extremities: No cyanosis or edema. Musculoskeletal: symmetric strength and muscle tone throughout. Neurologic: Mild resting tremor Psychiatric: Judgment and insight are intact. Affect is appropriate.   ECOG = 3   LABORATORY DATA:  Lab Results  Component Value Date   WBC 8.5 10/19/2018   HGB 11.2 (L) 10/19/2018   HCT 34.7 (L) 10/19/2018   MCV 93.3 10/19/2018   PLT 344 10/19/2018   NEUTROABS 6.4 10/19/2018     Lab Results  Component Value Date   NA 137 10/19/2018   K 3.8 10/19/2018   CL 105 10/19/2018   CO2 20 (L) 10/19/2018   GLUCOSE 86 10/19/2018   CREATININE 1.03 (H) 10/23/2018   CALCIUM 9.2 10/19/2018     DIAGNOSIS:     A. VAGINA, RIGHT VAGINAL CUFF MASS, BIOPSY:   High grade carcinoma.   - See comment.   COMMENT:   The malignant cells are positive for cytokeratin 7, p53, and PAX 8.  There is focal staining for cytokeratin 5/6. CDX-2, cytokeratin 20,  estrogen receptor, and P 40 are negative. A P 16 stain is focally  positive. This profile is nonspecific. It does not rule out primary  peritoneal carcinoma. Additional studies can be performed upon  clinician request.       RADIOGRAPHY: Ct Pelvis W Contrast  Result Date: 10/19/2018 CLINICAL DATA:  73 year old female with burning in the region of the inner thigh. EXAM: CT PELVIS WITH CONTRAST TECHNIQUE: Multidetector CT imaging of the pelvis was performed using the standard protocol following the bolus administration of intravenous contrast. CONTRAST:  48m OMNIPAQUE IOHEXOL 300 MG/ML  SOLN COMPARISON:  CT of the abdomen pelvis dated 09/23/2018 FINDINGS: Urinary Tract: There is dilatation of the visualized right ureter likely secondary to obstruction by the right adnexal/pelvic mass as seen on the CT of 09/23/2018. The visualized left ureter appears unremarkable. Bowel: Rectal contrast is noted. There is sigmoid diverticulosis without active inflammatory changes. There is tethering of multiple loops of small bowel to the right adnexal mass consistent with adhesions there is mild dilatation of small-bowel loops in the right hemipelvis measuring up to 3.5 cm indicative of a degree obstruction. Evidence of prior small bowel surgery with anastomotic suture in the left hemipelvis. Vascular/Lymphatic: The abdominal aorta and IVC are grossly unremarkable. No adenopathy. Reproductive: Hysterectomy. There is a 4.9 x 4.6 x 5.0 cm soft  tissue mass in the region the right adnexa contiguous with the vaginal cuff. There is complex fluid or air and debris within the center of this mass likely  extending from the vagina. There is irregularity and nodularity of the border of these mass with desmoplastic changes and tethering of the adjacent small bowel loops. Other: None no perirectal abscess or inflammatory changes. No fluid collection or soft tissue air. Musculoskeletal: Osteopenia. No acute osseous pathology. Left S2-S3 Tarlov cyst. IMPRESSION: 1. No fluid collection, abscess or soft tissue air in the perirectal region or medial thighs. 2. Right adnexal soft tissue contiguous with the vaginal cuff similar to prior CT. There is associated tethering of the adjacent small bowel loops with probable degree of small-bowel obstruction as well as obstruction of the distal right ureter. MRI may provide better evaluation of this mass. Electronically Signed   By: Anner Crete M.D.   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 CONTRAST:  3m OMNIPAQUE IOHEXOL 300 MG/ML SOLN, 328mOMNIPAQUE IOHEXOL 300 MG/ML SOLN COMPARISON:  CT, 09/23/2018 FINDINGS: Lower chest: Linear atelectasis at the lung bases.  Acute findings. Hepatobiliary: DoLeretha Dykesf the liver excluded from the field of view. Liver normal in overall size and attenuation. 1.5 cm low-density lesion, segment 4 B. 1.0 cm low-density lesion, segment 2. These are stable consistent with cysts. Subtle low-density lesion noted near the  liver dome, segment 7, also likely a cyst. This was more defined as a cyst on the prior CT. No other liver lesions. Status post cholecystectomy. No bile duct dilation. Pancreas: Unremarkable. No pancreatic ductal dilatation or surrounding inflammatory changes. Spleen: Normal in size without focal abnormality. Adrenals/Urinary Tract: No adrenal masses. Right ureter is obstructed an irregular mass along right adnexa. This causes marked right hydronephrosis and moderate right hydroureter. No left hydronephrosis. Left ureter normal in course and in caliber. Stable lower pole left renal cyst. Significant right renal cortical thinning. There is less enhancement of the right kidney compared to the left as well as delayed excretion on the right. Renal findings are stable from prior CT. Stomach/Bowel: There is an irregular mass that appears centered on the upper cecum and lower ascending colon, extending into the right adnexal soft tissues abutting the right superior vaginal cuff and pelvic loops of small bowel. Mass measures approximately 6.2 x 4.2 cm transversely by 6.1 cm superior to inferior. There is a projection of contrast, from what appears to be the cecum lying in the inferior pelvis, extending into the right pelvic mass. There is also a small bubble of air within the right pelvic mass. A small amount of contrast and a single bubble of air extends within the mass to the right vaginal cuff consistent with a fistula. There are numerous sigmoid colon diverticula without evidence of diverticulitis. No other bowel masses. No wall thickening and no inflammation. There is mild distention loops of mid to distal small bowel with air-fluid levels. No small bowel wall thickening or adjacent inflammation. Stomach is unremarkable. Vascular/Lymphatic: No discrete enlarged lymph nodes. Aortic atherosclerosis. Reproductive: Status post hysterectomy. Other: No hernia.  No ascites. Musculoskeletal: No fracture or acute finding. No  osteoblastic or osteolytic lesions. IMPRESSION: 1. No significant change when compared to the previous CT scan. Bowel contrast  better defines the right pelvic mass. 2. Some contrast as well as a small amount of air lies within the mass. This appears extraluminal. The defect or fistula appears to arise from the right side of the cecum, which lies in the inferior pelvis. 3. The right pelvic mass appears centered on the upper cecum and lower ascending colon, consistent a colon carcinoma. Mass is contiguous with the right vaginal cuff. There is a small amount of contrast and a small bubble of air that extends to the right vaginal cuff from the pelvic mass consistent with a fistula. 4. Mass measures approximately 6.2 x 4.2 x 6.1 cm. 5. No evidence of metastatic disease. Electronically Signed   By: Lajean Manes M.D.   On: 10/20/2018 16:06   Ct Biopsy  Result Date: 10/20/2018 INDICATION: GYN malignancy, right vaginal cuff mass with possible adjacent fistula EXAM: CT-GUIDED CORE BIOPSY RIGHT VAGINAL CUFF MASS MEDICATIONS: 1% LIDOCAINE LOCAL ANESTHESIA/SEDATION: 2.0 mg IV Versed; 50 mcg IV Fentanyl Moderate Sedation Time:  12 minutes The patient was continuously monitored during the procedure by the interventional radiology nurse under my direct supervision. PROCEDURE: The procedure, risks, benefits, and alternatives were explained to the patient. Questions regarding the procedure were encouraged and answered. The patient understands and consents to the procedure. Previous imaging reviewed. Patient position prone. Noncontrast localization CT performed. The right vaginal soft tissue mass was localized and marked for a posterior trans gluteal approach. Under sterile conditions and local anesthesia, a 17 gauge coaxial guide needle was advanced from right posterior trans gluteal approach to the right vaginal cuff mass. Needle position confirmed with CT. 3 18 gauge core biopsies obtained. Samples were intact and non  fragmented. These were placed in formalin. Needle removed. Postprocedure imaging demonstrates hematoma. Patient tolerated the procedure well without complication. Vital sign monitoring by nursing staff during the procedure will continue as patient is in the special procedures unit for post procedure observation. FINDINGS: The images document guide needle placement within the right vaginal cuff mass. Post biopsy images demonstrate no hemorrhage or hematoma. COMPLICATIONS: None immediate. IMPRESSION: Successful CT-guided core biopsy of the right vaginal cuff mass Electronically Signed   By: Jerilynn Mages.  Shick M.D.   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  The tumor on contrast-enhanced scans appears to originate from the distal ascending colon or cecum area.  Patient has been evaluated by gynecologic oncology and surgery and is not felt to be a candidate for surgery.  She is not a candidate for diversion of her bowels.  Patient denies any significant pain in the pelvis or abdominal area this evening.  She does not report significant vaginal bleeding.  I do not feel the patient will be a candidate for palliative radiation therapy directed to the pelvis.  This would most likely cause worsening of her fistula with tumor shrinkage.  If in the future patient developed significant vaginal bleeding then consideration for palliative radiation therapy would be indicated. Agree with symptom management with topical creams along the perineum and thighs.  Would be more than happy to speak with the son if so desired.   PLAN: PRN follow-up in radiation oncology    ------------------------------------------------  Blair Promise, PhD, MD  This document serves as a record of services personally performed by Gery Pray, MD. It was created on his behalf by Wilburn Mylar, a  trained medical  scribe. The creation of this record is based on the scribe's personal observations and the provider's statements to them. This document has been checked and approved by the attending provider.

## 2018-10-25 NOTE — Progress Notes (Signed)
[facility] Progress Note     Subjective: CC-  No complaints this morning. Just finished eating breakfast. Denies abdominal pain. Denies noticing any vaginal pain or discharge.  CT guided biopsy results show carcinoma CT scan showed likely cecal mass abutting vaginal cuff and pelvic loops of small bowel with 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  Objective: Vital signs in last 24 hours: Temp:  [97.9 F (36.6 C)-98.6 F (37 C)] 97.9 F (36.6 C) (09/22 0450) Pulse Rate:  [54-65] 54 (09/22 0450) Resp:  [16-17] 16 (09/22 0450) BP: (97-126)/(63-71) 121/67 (09/22 0450) SpO2:  [96 %-98 %] 96 % (09/22 0450) Last BM Date: 10/24/18  Intake/Output from previous day: 09/21 0701 - 09/22 0700 In: 2006 [P.O.:960; I.V.:1046] Out: -  Intake/Output this shift: Total I/O In: 360 [P.O.:360] Out: -   PE: Gen:  Alert, NAD, pleasant HEENT: EOM's intact, pupils equal and round Pulm:  Rate and effort normal Abd: Soft, NT/ND Skin: warm and dry  Lab Results:  No results for input(s): WBC, HGB, HCT, PLT in the last 72 hours. BMET Recent Labs    10/23/18 0513  CREATININE 1.03*   PT/INR No results for input(s): LABPROT, INR in the last 72 hours. CMP     Component Value Date/Time   NA 137 10/19/2018 0607   K 3.8 10/19/2018 0607   CL 105 10/19/2018 0607   CO2 20 (L) 10/19/2018 0607   GLUCOSE 86 10/19/2018 0607   BUN 15 10/19/2018 0607   CREATININE 1.03 (H) 10/23/2018 0513   CREATININE 0.98 10/04/2012 0811   CALCIUM 9.2 10/19/2018 0607   PROT 5.5 (L) 10/18/2018 0640   ALBUMIN 3.3 (L) 10/18/2018 0640   AST 12 (L) 10/18/2018 0640   ALT 9 10/18/2018 0640   ALKPHOS 60 10/18/2018 0640   BILITOT 0.4 10/18/2018 0640   GFRNONAA 53 (L) 10/23/2018 0513   GFRNONAA 59 (L) 10/04/2012 0811   GFRAA >60 10/23/2018 0513   GFRAA 68 10/04/2012 0811   Lipase     Component Value Date/Time   LIPASE 37 09/23/2018 2027       Studies/Results: No results  found.  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  ID - rocephin 9/13>>9/17 FEN - HH diet VTE - SCDs, lovenox Foley - none Follow up - TBD  Plan: I have reviewed CT scan.  Pt would need an extensive surgery to remove this mass, including resection of small bowel, cecum, nephrectomy and cystectomy.  I do not think this is appropriate in the setting of her  advanced age and dementia.  I also don't believe a diversion would be of much benefit as there are multiple loops of small bowel in her pelvis and an ostomy proximal to all of this would be high output and lead to dehydration and renal failure.  I recommend barrier creams to perineum and possible radiation oncology consult for palliative radiation to the area.  We will sign off.  Please call us if you have any questions.  I am happy to discuss with her family as well if needed.

## 2018-10-25 NOTE — Progress Notes (Addendum)
PROGRESS NOTE                                                                                                                                                                                                             Patient Demographics:    Alyssa Macdonald, is a 73 y.o. female, DOB - September 04, 1945, [id number]  Admit date - 10/16/2018   Admitting Physician Orene Desanctis, DO  Outpatient Primary MD for the patient is Bartholome Bill, MD  LOS - 8    Chief Complaint  Patient presents with   Vaginal Prolapse       Brief Narrative   73 year old female with Alzheimer's dementia, cervical dystonia with recently diagnosed rectovaginal fistula presented with severe vaginal burning.  She was diagnosed with a new right adnexal mass in August 2020 which was contiguous with the right aspect of her vaginal cuff.  She was discharged from the ED with plan on outpatient GYN oncology but she did not have an appointment until later this week. In the ED blood work showed drop in hemoglobin to 11.5 from 14.4 and chemistry showed mild AKI.  GYN oncology and CCS consulted.  Subjective:   Denies vaginal pain today.  Has minimal discharge from the vaginal area.  Assessment  & Plan :   Rectovaginal fistula with vaginal irritation/pain Surgery and GYN oncology following.  CT biopsy of the pelvic mass done on 9/17.  Shows high-grade carcinoma (cannot rule out peritoneal carcinoma) CT of the abdomen done on 9/17 showed cecal mass communicating with the vaginal cuff consistent with a fistula. Earlier planned for surgery but upon re-evaluation by surgery again today recommend patient will require extensive surgical resection of the mass,, with resection of small bowel, cecum, nephrectomy and cystectomy.  Also does not recommend diversion as she would need proximal ostomy with risk for high output.  Recommend radiation oncology consult for palliative  radiation.  Discussed with Dr. Lisbeth Renshaw (radiation oncology) who will evaluate.  Spoke with son who informed that he and his family (sister and aunt) are interested in palliative treatment given her age and dementia rather than aggressive measures.  Pain controlled on current regimen of Percocet and tramadol.  Active problems ?  UTI Received empiric Rocephin.  Hypotension and bradycardia discontinue propanolol.  Heart rate stable.  Chronic depression and anxiety On Lexapro and  Klonopin.  Alzheimer's dementia,?  Moderate Resident of SNF.  Home meds continued  Iron deficiency/acute blood loss anemia Started on iron supplement  Hypokalemia Replenished  Physical deconditioning PT recommends SNF.  Can be discharged in the next 24-48 hours once radiation oncology plan in place.  Social work consulted for SNF.   Code Status : DNR  Family Communication  : Updated son on the phone  Disposition Plan  : Plan on SNF after radiation oncology consult and further recommendation per surgery and oncology.  Barriers For Discharge : Improving symptoms  Consults  : CCS, oncology  Procedures CT abdomen pelvis, CT biopsy  DVT Prophylaxis  :  Lovenox -  Lab Results  Component Value Date   PLT 344 10/19/2018    Antibiotics  :   Anti-infectives (From admission, onward)   Start     Dose/Rate Route Frequency Ordered Stop   10/17/18 2200  cefTRIAXone (ROCEPHIN) 1 g in sodium chloride 0.9 % 100 mL IVPB  Status:  Discontinued     1 g 200 mL/hr over 30 Minutes Intravenous Every 24 hours 10/16/18 2046 10/20/18 1258   10/16/18 2015  cefTRIAXone (ROCEPHIN) 1 g in sodium chloride 0.9 % 100 mL IVPB     1 g 200 mL/hr over 30 Minutes Intravenous  Once 10/16/18 2000 10/16/18 2157        Objective:   Vitals:   10/24/18 0539 10/24/18 1321 10/24/18 2004 10/25/18 0450  BP: (!) 108/57 97/63 126/71 121/67  Pulse: 68 65 61 (!) 54  Resp: 17 17 16 16   Temp: 98 F (36.7 C) 97.9 F (36.6 C) 98.6 F  (37 C) 97.9 F (36.6 C)  TempSrc: Oral Oral Oral Oral  SpO2: 100% 96% 98% 96%  Weight:      Height:        Wt Readings from Last 3 Encounters:  10/16/18 86.2 kg  08/17/17 81.8 kg  05/31/17 78.5 kg     Intake/Output Summary (Last 24 hours) at 10/25/2018 1328 Last data filed at 10/25/2018 0907 Gross per 24 hour  Intake 1645.99 ml  Output --  Net 1645.99 ml   Physical exam Elderly female not in distress HEENT: Moist mucosa, supple neck Chest: Clear bilaterally CVs: Normal S1-S2 GI: Soft, nondistended, nontender, no vaginal discharge Musculoskeletal: Warm, no edema      Data Review:    CBC Recent Labs  Lab 10/19/18 0607  WBC 8.5  HGB 11.2*  HCT 34.7*  PLT 344  MCV 93.3  MCH 30.1  MCHC 32.3  RDW 12.7  LYMPHSABS 1.2  MONOABS 0.7  EOSABS 0.1  BASOSABS 0.0    Chemistries  Recent Labs  Lab 10/19/18 0607 10/20/18 1258 10/23/18 0513  NA 137  --   --   K 3.8  --   --   CL 105  --   --   CO2 20*  --   --   GLUCOSE 86  --   --   BUN 15  --   --   CREATININE 1.25* 1.32* 1.03*  CALCIUM 9.2  --   --    ------------------------------------------------------------------------------------------------------------------ No results for input(s): CHOL, HDL, LDLCALC, TRIG, CHOLHDL, LDLDIRECT in the last 72 hours.  No results found for: HGBA1C ------------------------------------------------------------------------------------------------------------------ No results for input(s): TSH, T4TOTAL, T3FREE, THYROIDAB in the last 72 hours.  Invalid input(s): FREET3 ------------------------------------------------------------------------------------------------------------------ No results for input(s): VITAMINB12, FOLATE, FERRITIN, TIBC, IRON, RETICCTPCT in the last 72 hours.  Coagulation profile Recent Labs  Lab 10/20/18 0535  INR 1.0    No results for input(s): DDIMER in the last 72 hours.  Cardiac Enzymes No results for input(s): CKMB, TROPONINI, MYOGLOBIN  in the last 168 hours.  Invalid input(s): CK ------------------------------------------------------------------------------------------------------------------ No results found for: BNP  Inpatient Medications  Scheduled Meds:  atorvastatin  10 mg Oral QPM   clonazePAM  0.5 mg Oral BID   donepezil  10 mg Oral QHS   enoxaparin (LOVENOX) injection  40 mg Subcutaneous QHS   escitalopram  10 mg Oral QHS   ferrous sulfate  325 mg Oral Q breakfast   fesoterodine  4 mg Oral Daily   influenza vaccine adjuvanted  0.5 mL Intramuscular Tomorrow-1000   Melatonin  5 mg Oral QHS   memantine  7 mg Oral Daily   nystatin-triamcinolone  1 application Topical TID   pantoprazole  40 mg Oral Daily   primidone  50 mg Oral Daily   traZODone  50 mg Oral QHS   Continuous Infusions:  sodium chloride 50 mL/hr at 10/24/18 1458   PRN Meds:.acetaminophen, ketorolac, oxyCODONE-acetaminophen  Micro Results Recent Results (from the past 240 hour(s))  Urine culture     Status: None   Collection Time: 10/16/18  7:26 PM   Specimen: Urine, Random  Result Value Ref Range Status   Specimen Description   Final    URINE, RANDOM Performed at [facility], [place] [address], [place], [place] [address]    Special Requests   Final    NONE Performed at [facility], [place] [address] [place], [place] [address]    Culture   Final    Multiple bacterial morphotypes present, none predominant. Suggest appropriate recollection if clinically indicated.   Report Status 10/18/2018 FINAL  Final  SARS Coronavirus 2 [facility] order, Performed in [facility] hospital lab) Nasopharyngeal Nasopharyngeal Swab     Status: None   Collection Time: 10/16/18  9:07 PM   Specimen: Nasopharyngeal Swab  Result Value Ref Range Status   SARS Coronavirus 2 NEGATIVE NEGATIVE Final    Comment: (NOTE) If result is NEGATIVE SARS-CoV-2 target nucleic acids are NOT DETECTED. The SARS-CoV-2 RNA  is generally detectable in upper and lower  respiratory specimens during the acute phase of infection. The lowest  concentration of SARS-CoV-2 viral copies this assay can detect is 250  copies / mL. A negative result does not preclude SARS-CoV-2 infection  and should not be used as the sole basis for treatment or other  patient management decisions.  A negative result may occur with  improper specimen collection / handling, submission of specimen other  than nasopharyngeal swab, presence of viral mutation(s) within the  areas targeted by this assay, and inadequate number of viral copies  (<250 copies / mL). A negative result must be combined with clinical  observations, patient history, and epidemiological information. If result is POSITIVE SARS-CoV-2 target nucleic acids are DETECTED. The SARS-CoV-2 RNA is generally detectable in upper and lower  respiratory specimens dur ing the acute phase of infection.  Positive  results are indicative of active infection with SARS-CoV-2.  Clinical  correlation with patient history and other diagnostic information is  necessary to determine patient infection status.  Positive results do  not rule out bacterial infection or co-infection with other viruses. If result is PRESUMPTIVE POSTIVE SARS-CoV-2 nucleic acids MAY BE PRESENT.   A presumptive positive result was obtained on the submitted specimen  and confirmed on repeat testing.  While 2019 novel coronavirus  (SARS-CoV-2) nucleic acids  may be present in the submitted sample  additional confirmatory testing may be necessary for epidemiological  and / or clinical management purposes  to differentiate between  SARS-CoV-2 and other Sarbecovirus currently known to infect humans.  If clinically indicated additional testing with an alternate test  methodology [phone]) is advised. The SARS-CoV-2 RNA is generally  detectable in upper and lower respiratory sp ecimens during the acute  phase of  infection. The expected result is Negative. Fact Sheet for Patients:  StrictlyIdeas.no Fact Sheet for Healthcare Providers: BankingDealers.co.za This test is not yet approved or cleared by the [place] FDA and has been authorized for detection and/or diagnosis of SARS-CoV-2 by FDA under an Emergency Use Authorization (EUA).  This EUA will remain in effect (meaning this test can be used) for the duration of the COVID-19 declaration under Section 564(b)(1) of the Act, 21 U.S.C. section 360bbb-3(b)(1), unless the authorization is terminated or revoked sooner. Performed at [facility], [place] [address], [place], [place] [address]     Radiology Reports Ct Pelvis W Contrast  Result Date: 10/19/2018 CLINICAL DATA:  73 year old female with burning in the region of the inner thigh. EXAM: CT PELVIS WITH CONTRAST TECHNIQUE: Multidetector CT imaging of the pelvis was performed using the standard protocol following the bolus administration of intravenous contrast. CONTRAST:  36mL OMNIPAQUE IOHEXOL 300 MG/ML  SOLN COMPARISON:  CT of the abdomen pelvis dated 09/23/2018 FINDINGS: Urinary Tract: There is dilatation of the visualized right ureter likely secondary to obstruction by the right adnexal/pelvic mass as seen on the CT of 09/23/2018. The visualized left ureter appears unremarkable. Bowel: Rectal contrast is noted. There is sigmoid diverticulosis without active inflammatory changes. There is tethering of multiple loops of small bowel to the right adnexal mass consistent with adhesions there is mild dilatation of small-bowel loops in the right hemipelvis measuring up to 3.5 cm indicative of a degree obstruction. Evidence of prior small bowel surgery with anastomotic suture in the left hemipelvis. Vascular/Lymphatic: The abdominal aorta and IVC are grossly unremarkable. No adenopathy. Reproductive: Hysterectomy. There is a 4.9 x 4.6 x 5.0  cm soft tissue mass in the region the right adnexa contiguous with the vaginal cuff. There is complex fluid or air and debris within the center of this mass likely extending from the vagina. There is irregularity and nodularity of the border of these mass with desmoplastic changes and tethering of the adjacent small bowel loops. Other: None no perirectal abscess or inflammatory changes. No fluid collection or soft tissue air. Musculoskeletal: Osteopenia. No acute osseous pathology. Left S2-S3 Tarlov cyst. IMPRESSION: 1. No fluid collection, abscess or soft tissue air in the perirectal region or medial thighs. 2. Right adnexal soft tissue contiguous with the vaginal cuff similar to prior CT. There is associated tethering of the adjacent small bowel loops with probable degree of small-bowel obstruction as well as obstruction of the distal right ureter. MRI may provide better evaluation of this mass. Electronically Signed   By: Anner Crete M.D.   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 CONTRAST:  9mL OMNIPAQUE IOHEXOL 300 MG/ML SOLN, 53mL OMNIPAQUE IOHEXOL 300 MG/ML SOLN COMPARISON:  CT, 09/23/2018 FINDINGS: Lower chest: Linear atelectasis at the lung bases.  Acute findings. Hepatobiliary: Leretha Dykes of the liver excluded from the field of view. Liver normal in overall size and attenuation. 1.5 cm low-density lesion, segment 4 B. 1.0 cm low-density lesion, segment 2. These are stable consistent with cysts. Subtle low-density lesion noted near  the liver dome, segment 7, also likely a cyst. This was more defined as a cyst on the prior CT. No other liver lesions. Status post cholecystectomy. No bile duct dilation. Pancreas: Unremarkable. No pancreatic ductal dilatation or surrounding inflammatory changes. Spleen: Normal in size without focal abnormality. Adrenals/Urinary Tract: No adrenal masses. Right ureter is obstructed an irregular mass along right adnexa. This causes marked right hydronephrosis and moderate right hydroureter. No left hydronephrosis. Left ureter normal in course and in caliber. Stable lower pole left renal cyst. Significant right renal cortical thinning. There is less enhancement of the right kidney compared to the left as well as delayed excretion on the right. Renal findings are stable from prior CT. Stomach/Bowel: There is an irregular mass that appears centered on the upper cecum and lower ascending colon, extending into the right adnexal soft tissues abutting the right superior vaginal cuff and pelvic loops of small bowel. Mass measures approximately 6.2 x 4.2 cm transversely by 6.1 cm superior to inferior. There is a projection of contrast, from what appears to be the cecum lying in the inferior pelvis, extending into the right pelvic mass. There is also a small bubble of air within the right pelvic mass. A small amount of contrast and a single bubble of air extends within the mass to the right vaginal cuff consistent with a fistula. There are numerous sigmoid colon diverticula without evidence of diverticulitis. No other bowel masses. No wall thickening and no inflammation. There is mild distention loops of mid to distal small bowel with air-fluid levels. No small bowel wall thickening or adjacent inflammation. Stomach is unremarkable. Vascular/Lymphatic: No discrete enlarged lymph nodes. Aortic atherosclerosis. Reproductive: Status post hysterectomy. Other: No hernia.  No ascites. Musculoskeletal: No fracture or acute finding.  No osteoblastic or osteolytic lesions. IMPRESSION: 1. No significant change when compared to the previous CT scan. Bowel contrast better defines the right pelvic mass. 2. Some contrast as well as a small amount of air lies within the mass. This appears extraluminal. The defect or fistula appears to arise from the right side of the cecum, which lies in the inferior pelvis. 3. The right pelvic mass appears centered on the upper cecum and lower ascending colon, consistent a colon carcinoma. Mass is contiguous with the right vaginal cuff. There is a small amount of contrast and a small bubble of air that extends to the right vaginal cuff from the pelvic mass consistent with a fistula. 4. Mass measures approximately 6.2 x 4.2 x 6.1 cm. 5. No evidence of metastatic disease. Electronically Signed   By: Lajean Manes M.D.   On: 10/20/2018 16:06   Ct Biopsy  Result Date: 10/20/2018 INDICATION: GYN malignancy, right vaginal cuff mass with possible adjacent fistula EXAM: CT-GUIDED CORE BIOPSY RIGHT VAGINAL CUFF MASS MEDICATIONS: 1% LIDOCAINE LOCAL ANESTHESIA/SEDATION: 2.0 mg IV Versed; 50 mcg IV Fentanyl Moderate Sedation Time:  12 minutes The patient was continuously monitored during the procedure by the interventional radiology nurse under my direct supervision. PROCEDURE: The procedure, risks, benefits, and alternatives were explained to the patient. Questions regarding the procedure  were encouraged and answered. The patient understands and consents to the procedure. Previous imaging reviewed. Patient position prone. Noncontrast localization CT performed. The right vaginal soft tissue mass was localized and marked for a posterior trans gluteal approach. Under sterile conditions and local anesthesia, a 17 gauge coaxial guide needle was advanced from right posterior trans gluteal approach to the right vaginal cuff mass. Needle position confirmed with CT. 3 18 gauge core biopsies obtained. Samples were intact and non  fragmented. These were placed in formalin. Needle removed. Postprocedure imaging demonstrates hematoma. Patient tolerated the procedure well without complication. Vital sign monitoring by nursing staff during the procedure will continue as patient is in the special procedures unit for post procedure observation. FINDINGS: The images document guide needle placement within the right vaginal cuff mass. Post biopsy images demonstrate no hemorrhage or hematoma. COMPLICATIONS: None immediate. IMPRESSION: Successful CT-guided core biopsy of the right vaginal cuff mass Electronically Signed   By: Jerilynn Mages.  Shick M.D.   On: 10/20/2018 09:38    Time Spent in minutes  25   Darry Kelnhofer M.D on 10/25/2018 at 1:28 PM  Between 7am to 7pm - Pager - [phone]  After 7pm go to [email or web address] - password [facility]  [facility] -  Office  [phone]

## 2018-10-26 LAB — CBC
HCT: 33.4 % — ABNORMAL LOW (ref 36.0–46.0)
Hemoglobin: 10.4 g/dL — ABNORMAL LOW (ref 12.0–15.0)
MCH: 29.8 pg (ref 26.0–34.0)
MCHC: 31.1 g/dL (ref 30.0–36.0)
MCV: 95.7 fL (ref 80.0–100.0)
Platelets: 353 10*3/uL (ref 150–400)
RBC: 3.49 MIL/uL — ABNORMAL LOW (ref 3.87–5.11)
RDW: 12.9 % (ref 11.5–15.5)
WBC: 6.4 10*3/uL (ref 4.0–10.5)
nRBC: 0 % (ref 0.0–0.2)

## 2018-10-26 LAB — BASIC METABOLIC PANEL
Anion gap: 8 (ref 5–15)
BUN: 20 mg/dL (ref 8–23)
CO2: 22 mmol/L (ref 22–32)
Calcium: 8.8 mg/dL — ABNORMAL LOW (ref 8.9–10.3)
Chloride: 106 mmol/L (ref 98–111)
Creatinine, Ser: 1.25 mg/dL — ABNORMAL HIGH (ref 0.44–1.00)
GFR calc Af Amer: 49 mL/min — ABNORMAL LOW (ref >60)
GFR calc non Af Amer: 42 mL/min — ABNORMAL LOW (ref >60)
Glucose, Bld: 84 mg/dL (ref 70–99)
Potassium: 4 mmol/L (ref 3.5–5.1)
Sodium: 136 mmol/L (ref 135–145)

## 2018-10-26 NOTE — Progress Notes (Signed)
PT Cancellation Note  Patient Details Name: Alyssa Macdonald MRN: [id number] DOB: 06-27-1945   Cancelled Treatment:    Reason Eval/Treat Not Completed: Pain limiting ability to participate. Pt reported she had too much burning to work with PT. Will check back another day.   Weston Anna, PT Acute Rehabilitation Services Pager: [phone] Office: [phone]

## 2018-10-26 NOTE — Progress Notes (Signed)
PROGRESS NOTE    Alyssa Macdonald  [id number] DOB: 1945/06/20 DOA: 10/16/2018 PCP: Bartholome Bill, MD     Brief Narrative:  Alyssa Macdonald is a 73 year old female with Alzheimer's dementia, cervical dystonia with recently diagnosed rectovaginal fistula presented with severe vaginal burning.  She was diagnosed with a new right adnexal mass in August 2020 which was contiguous with the right aspect of her vaginal cuff.  She was discharged from the ED with plan on outpatient GYN oncology but she did not have an appointment until later this week. Upon presentation in the ED, blood work showed drop in hemoglobin to 11.5 from 14.4 and chemistry showed mild AKI.  GYN oncology and CCS consulted.  New events last 24 hours / Subjective: No new events overnight.  Has no acute complaints this morning.  Assessment & Plan:   Active Problems:   Anemia   Enterovaginal fistula   Vaginal mass   Abnormal CT scan   Rectovaginal fistula with vaginal irritation/pain -Surgery and GYN oncology consulted.  CT biopsy of the pelvic mass done on 9/17.  Shows high-grade carcinoma (cannot rule out peritoneal carcinoma) CT of the abdomen done on 9/17 showed cecal mass communicating with the vaginal cuff consistent with a fistula. -According to general surgery, patient would need extensive surgery to remove this mass, including resection of small bowel, cecum, nephrectomy and cystectomy.  Also did not feel that diversion would be of much benefit due to multiple loops of small bowel in the pelvis and ostomy proximal to all of this would be high output, lead to dehydration and renal failure. -Radiation oncology consulted, did not recommend palliative radiation directed to the pelvis, felt that this would likely cause worsening of her fistula with tumor shrinkage.  If in the future, patient developed significant vaginal bleeding, could consider palliative radiation at that time.  Currently recommending symptom  management only.  Pyuria -Urine culture negative  CKD stage III -Baseline creatinine 1.3 -Stable  Hypotension and bradycardia -Discontinue propanolol  Chronic depression and anxiety -Continue Lexapro and Klonopin  Alzheimer's dementia -Stable. Continue aricept, namenda   Iron deficiency/acute blood loss anemia -Started on iron supplement -Hemoglobin stable  HLD -Continue lipitor   Physical deconditioning -PT recommends SNF       DVT prophylaxis: Lovenox Code Status: DNR Family Communication: Spoke with son over the phone this morning Disposition Plan: Patient not a candidate for surgical procedure or radiation tx.  Now awaiting SNF placement for discharge.   Consultants:   GYN oncology  General surgery  Radiation oncology    Antimicrobials:  Anti-infectives (From admission, onward)   Start     Dose/Rate Route Frequency Ordered Stop   10/17/18 2200  cefTRIAXone (ROCEPHIN) 1 g in sodium chloride 0.9 % 100 mL IVPB  Status:  Discontinued     1 g 200 mL/hr over 30 Minutes Intravenous Every 24 hours 10/16/18 2046 10/20/18 1258   10/16/18 2015  cefTRIAXone (ROCEPHIN) 1 g in sodium chloride 0.9 % 100 mL IVPB     1 g 200 mL/hr over 30 Minutes Intravenous  Once 10/16/18 2000 10/16/18 2157        Objective: Vitals:   10/25/18 0450 10/25/18 1338 10/25/18 2113 10/26/18 0416  BP: 121/67 (!) 105/54 (!) 141/74 (!) 101/58  Pulse: (!) 54 (!) 58 72 (!) 51  Resp: 16 18 16 16   Temp: 97.9 F (36.6 C) 97.7 F (36.5 C) 97.8 F (36.6 C) 97.8 F (36.6 C)  TempSrc: Oral Oral Oral  Oral  SpO2: 96% 98% 99% 95%  Weight:      Height:        Intake/Output Summary (Last 24 hours) at 10/26/2018 1010 Last data filed at 10/25/2018 1300 Gross per 24 hour  Intake 360 ml  Output -  Net 360 ml   Filed Weights   10/16/18 1458  Weight: 86.2 kg    Examination:  General exam: Appears calm and comfortable, with tremors Respiratory system: Clear to auscultation.  Respiratory effort normal. No respiratory distress. No conversational dyspnea.  Cardiovascular system: S1 & S2 heard, RRR. No murmurs. No pedal edema. Gastrointestinal system: Abdomen is nondistended, soft and nontender. Normal bowel sounds heard. Central nervous system: Alert  Extremities: Symmetric in appearance  Skin: No rashes, lesions or ulcers on exposed skin  Psychiatry: History of dementia  Data Reviewed: I have personally reviewed following labs and imaging studies  CBC: Recent Labs  Lab 10/26/18 0503  WBC 6.4  HGB 10.4*  HCT 33.4*  MCV 95.7  PLT 0000000   Basic Metabolic Panel: Recent Labs  Lab 10/20/18 1258 10/23/18 0513 10/26/18 0503  NA  --   --  136  K  --   --  4.0  CL  --   --  106  CO2  --   --  22  GLUCOSE  --   --  84  BUN  --   --  20  CREATININE 1.32* 1.03* 1.25*  CALCIUM  --   --  8.8*   GFR: Estimated Creatinine Clearance: 43.8 mL/min (A) (by C-G formula based on SCr of 1.25 mg/dL (H)). Liver Function Tests: No results for input(s): AST, ALT, ALKPHOS, BILITOT, PROT, ALBUMIN in the last 168 hours. No results for input(s): LIPASE, AMYLASE in the last 168 hours. No results for input(s): AMMONIA in the last 168 hours. Coagulation Profile: Recent Labs  Lab 10/20/18 0535  INR 1.0   Cardiac Enzymes: No results for input(s): CKTOTAL, CKMB, CKMBINDEX, TROPONINI in the last 168 hours. BNP (last 3 results) No results for input(s): PROBNP in the last 8760 hours. HbA1C: No results for input(s): HGBA1C in the last 72 hours. CBG: No results for input(s): GLUCAP in the last 168 hours. Lipid Profile: No results for input(s): CHOL, HDL, LDLCALC, TRIG, CHOLHDL, LDLDIRECT in the last 72 hours. Thyroid Function Tests: No results for input(s): TSH, T4TOTAL, FREET4, T3FREE, THYROIDAB in the last 72 hours. Anemia Panel: No results for input(s): VITAMINB12, FOLATE, FERRITIN, TIBC, IRON, RETICCTPCT in the last 72 hours. Sepsis Labs: No results for input(s):  PROCALCITON, LATICACIDVEN in the last 168 hours.  Recent Results (from the past 240 hour(s))  Urine culture     Status: None   Collection Time: 10/16/18  7:26 PM   Specimen: Urine, Random  Result Value Ref Range Status   Specimen Description   Final    URINE, RANDOM Performed at [place] [address], [place], [place] [address]    Special Requests   Final    NONE Performed at [facility], [place] [address], [place], [place] [address]    Culture   Final    Multiple bacterial morphotypes present, none predominant. Suggest appropriate recollection if clinically indicated.   Report Status 10/18/2018 FINAL  Final  SARS Coronavirus 2 [facility] order, Performed in [facility] hospital lab) Nasopharyngeal Nasopharyngeal Swab     Status: None   Collection Time: 10/16/18  9:07 PM   Specimen: Nasopharyngeal Swab  Result Value Ref Range  Status   SARS Coronavirus 2 NEGATIVE NEGATIVE Final    Comment: (NOTE) If result is NEGATIVE SARS-CoV-2 target nucleic acids are NOT DETECTED. The SARS-CoV-2 RNA is generally detectable in upper and lower  respiratory specimens during the acute phase of infection. The lowest  concentration of SARS-CoV-2 viral copies this assay can detect is 250  copies / mL. A negative result does not preclude SARS-CoV-2 infection  and should not be used as the sole basis for treatment or other  patient management decisions.  A negative result may occur with  improper specimen collection / handling, submission of specimen other  than nasopharyngeal swab, presence of viral mutation(s) within the  areas targeted by this assay, and inadequate number of viral copies  (<250 copies / mL). A negative result must be combined with clinical  observations, patient history, and epidemiological information. If result is POSITIVE SARS-CoV-2 target nucleic acids are DETECTED. The SARS-CoV-2 RNA is generally detectable in upper and lower   respiratory specimens dur ing the acute phase of infection.  Positive  results are indicative of active infection with SARS-CoV-2.  Clinical  correlation with patient history and other diagnostic information is  necessary to determine patient infection status.  Positive results do  not rule out bacterial infection or co-infection with other viruses. If result is PRESUMPTIVE POSTIVE SARS-CoV-2 nucleic acids MAY BE PRESENT.   A presumptive positive result was obtained on the submitted specimen  and confirmed on repeat testing.  While 2019 novel coronavirus  (SARS-CoV-2) nucleic acids may be present in the submitted sample  additional confirmatory testing may be necessary for epidemiological  and / or clinical management purposes  to differentiate between  SARS-CoV-2 and other Sarbecovirus currently known to infect humans.  If clinically indicated additional testing with an alternate test  methodology [id number]) is advised. The SARS-CoV-2 RNA is generally  detectable in upper and lower respiratory sp ecimens during the acute  phase of infection. The expected result is Negative. Fact Sheet for Patients:  StrictlyIdeas.no Fact Sheet for Healthcare Providers: BankingDealers.co.za This test is not yet approved or cleared by the [place] FDA and has been authorized for detection and/or diagnosis of SARS-CoV-2 by FDA under an Emergency Use Authorization (EUA).  This EUA will remain in effect (meaning this test can be used) for the duration of the COVID-19 declaration under Section 564(b)(1) of the Act, 21 U.S.C. section 360bbb-3(b)(1), unless the authorization is terminated or revoked sooner. Performed at [facility], [place] [address], [place], [place] [address]       Radiology Studies: No results found.    Scheduled Meds: . atorvastatin  10 mg Oral QPM  . clonazePAM  0.5 mg Oral BID  . donepezil  10 mg Oral QHS   . enoxaparin (LOVENOX) injection  40 mg Subcutaneous QHS  . escitalopram  10 mg Oral QHS  . ferrous sulfate  325 mg Oral Q breakfast  . fesoterodine  4 mg Oral Daily  . influenza vaccine adjuvanted  0.5 mL Intramuscular Tomorrow-1000  . Melatonin  5 mg Oral QHS  . memantine  7 mg Oral Daily  . nystatin-triamcinolone  1 application Topical TID  . pantoprazole  40 mg Oral Daily  . primidone  50 mg Oral Daily  . traZODone  50 mg Oral QHS   Continuous Infusions: . sodium chloride 50 mL/hr at 10/26/18 0704     LOS: 9 days      Time spent: 45 minutes   Dessa Phi, DO  [facility] [email or web address] 10/26/2018, 10:10 AM

## 2018-10-26 NOTE — TOC Progression Note (Addendum)
Transition of Care [facility]) - Progression Note    Patient Details  Name: Alyssa Macdonald MRN: [id number] Date of Birth: 02/19/1945  Transition of Care [facility]) CM/SW [place] Phone Number: [phone] 10/26/2018, 10:30 AM  Clinical Narrative:  Pt's family reports no plan for radiation or surgery currently. Still planning for pt to admit to SNF for short term rehab, CSW provided bed offers for son to review.   13:54- son selected Alyssa Macdonald- facility initiating [facility] insurance authorization request.  Pt needs updated covid test prior to SNF admission    Expected Discharge Plan: [place] Barriers to Discharge: Continued Medical Work up, SNF Pending bed offer, Ship broker  Expected Discharge Plan and Services Expected Discharge Plan: [place] In-house Referral: Clinical Social Work   Post Acute Care Choice: [place] Living arrangements for the past 2 months: Assisted Living Facility(memory care)                                       Social Determinants of Health (SDOH) Interventions    Readmission Risk Interventions No flowsheet data found.

## 2018-10-27 LAB — NOVEL CORONAVIRUS, NAA (HOSP ORDER, SEND-OUT TO REF LAB; TAT 18-24 HRS): SARS-CoV-2, NAA: NOT DETECTED

## 2018-10-27 NOTE — Care Management Important Message (Signed)
Important Message  Patient Details IM Letter given to [place] SW to present to the Patient Name: Alyssa Macdonald MRN: [id number] Date of Birth: August 28, 1945   Medicare Important Message Given:  Yes     Kerin Salen 10/27/2018, 11:00 AM

## 2018-10-27 NOTE — Progress Notes (Signed)
PROGRESS NOTE    Alyssa Macdonald  [id number] DOB: July 26, 1945 DOA: 10/16/2018 PCP: Bartholome Bill, MD     Brief Narrative:  Alyssa Macdonald is a 73 year old female with Alzheimer's dementia, cervical dystonia with recently diagnosed rectovaginal fistula presented with severe vaginal burning.  She was diagnosed with a new right adnexal mass in August 2020 which was contiguous with the right aspect of her vaginal cuff.  She was discharged from the ED with plan on outpatient GYN oncology but she did not have an appointment until later this week. Upon presentation in the ED, blood work showed drop in hemoglobin to 11.5 from 14.4 and chemistry showed mild AKI.  GYN oncology and CCS consulted.  New events last 24 hours / Subjective: No new issues overnight. Repeat COVID testing pending.   Assessment & Plan:   Active Problems:   Anemia   Enterovaginal fistula   Vaginal mass   Abnormal CT scan   Rectovaginal fistula with vaginal irritation/pain -Surgery and GYN oncology consulted.  CT biopsy of the pelvic mass done on 9/17.  Shows high-grade carcinoma (cannot rule out peritoneal carcinoma) CT of the abdomen done on 9/17 showed cecal mass communicating with the vaginal cuff consistent with a fistula. -According to general surgery, patient would need extensive surgery to remove this mass, including resection of small bowel, cecum, nephrectomy and cystectomy.  Also did not feel that diversion would be of much benefit due to multiple loops of small bowel in the pelvis and ostomy proximal to all of this would be high output, lead to dehydration and renal failure. -Radiation oncology consulted, did not recommend palliative radiation directed to the pelvis, felt that this would likely cause worsening of her fistula with tumor shrinkage.  If in the future, patient developed significant vaginal bleeding, could consider palliative radiation at that time.  Currently recommending symptom management  only.  Pyuria -Urine culture negative  CKD stage III -Baseline creatinine 1.3 -Stable  Hypotension and bradycardia -Discontinue propanolol  Chronic depression and anxiety -Continue Lexapro and Klonopin  Alzheimer's dementia -Stable. Continue aricept, namenda   Iron deficiency/acute blood loss anemia -Started on iron supplement -Hemoglobin stable  HLD -Continue lipitor   Physical deconditioning -PT recommends SNF       DVT prophylaxis: Lovenox Code Status: DNR Family Communication: None  Disposition Plan: Patient not a candidate for surgical procedure or radiation tx.  Now awaiting SNF placement for discharge. Repeat COVID pending.    Consultants:   GYN oncology  General surgery  Radiation oncology    Antimicrobials:  Anti-infectives (From admission, onward)   Start     Dose/Rate Route Frequency Ordered Stop   10/17/18 2200  cefTRIAXone (ROCEPHIN) 1 g in sodium chloride 0.9 % 100 mL IVPB  Status:  Discontinued     1 g 200 mL/hr over 30 Minutes Intravenous Every 24 hours 10/16/18 2046 10/20/18 1258   10/16/18 2015  cefTRIAXone (ROCEPHIN) 1 g in sodium chloride 0.9 % 100 mL IVPB     1 g 200 mL/hr over 30 Minutes Intravenous  Once 10/16/18 2000 10/16/18 2157       Objective: Vitals:   10/26/18 0416 10/26/18 1330 10/26/18 2059 10/27/18 0544  BP: (!) 101/58 (!) 104/58 (!) 127/56 (!) 105/59  Pulse: (!) 51 63 78 61  Resp: 16 18 19 18   Temp: 97.8 F (36.6 C) 97.9 F (36.6 C) 97.9 F (36.6 C) 98 F (36.7 C)  TempSrc: Oral Oral Oral Oral  SpO2: 95% 96% 99%  94%  Weight:      Height:        Intake/Output Summary (Last 24 hours) at 10/27/2018 1111 Last data filed at 10/27/2018 0827 Gross per 24 hour  Intake 720 ml  Output -  Net 720 ml   Filed Weights   10/16/18 1458  Weight: 86.2 kg    Examination: General exam: Appears calm and comfortable  Respiratory system: Clear to auscultation. Respiratory effort normal.  Cardiovascular system:  S1 & S2 heard, RRR. No JVD, murmurs, rubs, gallops or clicks. No pedal edema.  Gastrointestinal system: Abdomen is nondistended, soft and nontender. No organomegaly or masses felt. Normal bowel sounds heard. Central nervous system: Alert. No focal neurological deficits. Extremities: Symmetric Skin: No rashes, lesions or ulcers Psychiatry: Dementia     Data Reviewed: I have personally reviewed following labs and imaging studies  CBC: Recent Labs  Lab 10/26/18 0503  WBC 6.4  HGB 10.4*  HCT 33.4*  MCV 95.7  PLT 0000000   Basic Metabolic Panel: Recent Labs  Lab 10/20/18 1258 10/23/18 0513 10/26/18 0503  NA  --   --  136  K  --   --  4.0  CL  --   --  106  CO2  --   --  22  GLUCOSE  --   --  84  BUN  --   --  20  CREATININE 1.32* 1.03* 1.25*  CALCIUM  --   --  8.8*   GFR: Estimated Creatinine Clearance: 43.8 mL/min (A) (by C-G formula based on SCr of 1.25 mg/dL (H)). Liver Function Tests: No results for input(s): AST, ALT, ALKPHOS, BILITOT, PROT, ALBUMIN in the last 168 hours. No results for input(s): LIPASE, AMYLASE in the last 168 hours. No results for input(s): AMMONIA in the last 168 hours. Coagulation Profile: No results for input(s): INR, PROTIME in the last 168 hours. Cardiac Enzymes: No results for input(s): CKTOTAL, CKMB, CKMBINDEX, TROPONINI in the last 168 hours. BNP (last 3 results) No results for input(s): PROBNP in the last 8760 hours. HbA1C: No results for input(s): HGBA1C in the last 72 hours. CBG: No results for input(s): GLUCAP in the last 168 hours. Lipid Profile: No results for input(s): CHOL, HDL, LDLCALC, TRIG, CHOLHDL, LDLDIRECT in the last 72 hours. Thyroid Function Tests: No results for input(s): TSH, T4TOTAL, FREET4, T3FREE, THYROIDAB in the last 72 hours. Anemia Panel: No results for input(s): VITAMINB12, FOLATE, FERRITIN, TIBC, IRON, RETICCTPCT in the last 72 hours. Sepsis Labs: No results for input(s): PROCALCITON, LATICACIDVEN in the  last 168 hours.  No results found for this or any previous visit (from the past 240 hour(s)).    Radiology Studies: No results found.    Scheduled Meds: . atorvastatin  10 mg Oral QPM  . clonazePAM  0.5 mg Oral BID  . donepezil  10 mg Oral QHS  . enoxaparin (LOVENOX) injection  40 mg Subcutaneous QHS  . escitalopram  10 mg Oral QHS  . ferrous sulfate  325 mg Oral Q breakfast  . fesoterodine  4 mg Oral Daily  . influenza vaccine adjuvanted  0.5 mL Intramuscular Tomorrow-1000  . Melatonin  5 mg Oral QHS  . memantine  7 mg Oral Daily  . nystatin-triamcinolone  1 application Topical TID  . pantoprazole  40 mg Oral Daily  . primidone  50 mg Oral Daily  . traZODone  50 mg Oral QHS   Continuous Infusions:    LOS: 10 days      Time  spent: 25 minutes   Dessa Phi, DO [facility] [email or web address] 10/27/2018, 11:11 AM

## 2018-10-28 MED ORDER — CLONAZEPAM 0.5 MG PO TABS: 0.5000 mg | ORAL_TABLET | Freq: Two times a day (BID) | ORAL | 0 refills | Status: DC

## 2018-10-28 MED ORDER — NYSTATIN-TRIAMCINOLONE 100000-0.1 UNIT/GM-% EX CREA: 1.0000 "application " | TOPICAL_CREAM | Freq: Three times a day (TID) | CUTANEOUS | 0 refills | Status: AC

## 2018-10-28 NOTE — Progress Notes (Addendum)
Attempted to call report to [facility], admitting RN was not available at the time, this RN left her name and number to be reached at when admitting RN is available.   Jodie RN at [facility], called to receive report on pt. Pt is awaiting transfer via [facility] at this time.

## 2018-10-28 NOTE — TOC Transition Note (Signed)
Transition of Care [facility]) - CM/SW Discharge Note   Patient Details  Name: [name] MRN: [id number] Date of Birth: 05-Sep-1945  Transition of [facility]) CM/SW Contact:  Nila Nephew, LCSW Phone Number: [phone] 10/28/2018, 12:44 PM   Clinical Narrative:   Pt admitting to [facility], room 901, report [phone]  Pt's son updated  [facility] transport arranged  [facility] Medicare authorized admission    Final next level of care: Skilled Nursing Facility Barriers to Discharge: Barriers Resolved   Patient Goals and CMS Choice Patient states their goals for this hospitalization and ongoing recovery are:: son: "she has done well in rehab last year, would be good to have again before she goes back to [employer].gov Compare Post Acute Care list provided to:: Other (Comment Required)(son) Choice offered to / list presented to : Adult Children  Discharge Placement              Patient chooses bed at: [facility] Patient to be transferred to facility by: [place] Name of family member notified: son Legrand Como Patient and family notified of of transfer: 10/28/18  Discharge Plan and Services In-house Referral: Clinical Social Work   Post Acute Care Choice: [place]                               Social Determinants of Health (SDOH) Interventions     Readmission Risk Interventions No flowsheet data found.

## 2018-10-28 NOTE — Progress Notes (Signed)
Physical Therapy Treatment Patient Details Name: Alyssa Macdonald MRN: [id number] DOB: Jul 20, 1945 Today's Date: 10/28/2018    History of Present Illness 73 y.o.female with medical history significant of Alzheimer's dementia, cervical dystonia, hysterectomy, bil TKAs, HTN, essential tremor,  recent diagnosis of rectovaginal fistula who presented with complaints of severe vaginal burning.    PT Comments    Assisted OOB to [facility].  General bed mobility comments: close guard for safety and increased time.  General transfer comment: close guard for safety. vcs safety, hand placement.   Assisted from elevated bed to [facility] without RW. Assisted with peri care.  Max c/o "burning" rashy red genital area pain.  Assisted with amb.  General Gait Details: noted increased tremors throughout at rest and increased with increased gait distance. Pt will need ST Rehab at SNF prior to returning to her ALF.    Follow Up Recommendations  SNF     Equipment Recommendations  Rolling walker with 5" wheels    Recommendations for Other Services       Precautions / Restrictions Precautions Precautions: Fall Restrictions Weight Bearing Restrictions: No    Mobility  Bed Mobility Overal bed mobility: Needs Assistance Bed Mobility: Supine to Sit;Sit to Supine     Supine to sit: Min guard Sit to supine: Min assist   General bed mobility comments: close guard for safety and increased time  Transfers Overall transfer level: Needs assistance Equipment used: Rolling walker (2 wheeled);None Transfers: Sit to/from [facility] Sit to Stand: Supervision;Min guard Stand pivot transfers: Supervision;Min guard       General transfer comment: close guard for safety. vcs safety, hand placement.   Assisted from elevated bed to [facility] without RW.  Ambulation/Gait Ambulation/Gait assistance: Min guard;Supervision Gait Distance (Feet): 138 Feet Assistive device: Rolling walker (2 wheeled) Gait  Pattern/deviations: Step-through pattern;Decreased stride length Gait velocity: decr   General Gait Details: noted increased tremors throughout at rest and increased with increased gait distance.   Stairs             Wheelchair Mobility    Modified Rankin (Stroke Patients Only)       Balance                                            Cognition Arousal/Alertness: Awake/alert Behavior During Therapy: WFL for tasks assessed/performed Overall Cognitive Status: History of cognitive impairments - at baseline                                        Exercises      General Comments        Pertinent Vitals/Pain Pain Assessment: Faces Faces Pain Scale: Hurts little more Pain Location: vaginal/rectal skin rash Pain Descriptors / Indicators: Burning Pain Intervention(s): Monitored during session;Patient requesting pain meds-RN notified    Home Living                      Prior Function            PT Goals (current goals can now be found in the care plan section) Progress towards PT goals: Progressing toward goals    Frequency    Min 2X/week      PT Plan Current plan remains appropriate    Co-evaluation  AM-PAC PT "6 Clicks" Mobility   Outcome Measure  Help needed turning from your back to your side while in a flat bed without using bedrails?: A Little Help needed moving from lying on your back to sitting on the side of a flat bed without using bedrails?: A Little Help needed moving to and from a bed to a chair (including a wheelchair)?: A Little Help needed standing up from a chair using your arms (e.g., wheelchair or bedside chair)?: A Little Help needed to walk in hospital room?: A Little Help needed climbing 3-5 steps with a railing? : A Lot 6 Click Score: 17    End of Session Equipment Utilized During Treatment: Gait belt Activity Tolerance: Patient tolerated treatment well Patient  left: with call bell/phone within reach;with chair alarm set;in bed Nurse Communication: Other (comment);Patient requests pain meds PT Visit Diagnosis: Unsteadiness on feet (R26.81);Muscle weakness (generalized) (M62.81)     Time: [id number] PT Time Calculation (min) (ACUTE ONLY): 25 min  Charges:  $Gait Training: 8-22 mins $Therapeutic Activity: 8-22 mins                     Rica Koyanagi  PTA Acute  Rehabilitation Services Pager      [phone] Office      [phone]

## 2018-10-28 NOTE — Discharge Summary (Signed)
Physician Discharge Summary  [name] [id number] DOB: Jun 11, 1945 DOA: 10/16/2018  PCP: Bartholome Bill, MD  Admit date: 10/16/2018 Discharge date: 10/28/2018  Admitted From: Home Disposition:  SNF  Recommendations for Outpatient Follow-up:  1. Follow up with PCP in 1 week 2. 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 She was diagnosed with a new right adnexal mass in August 2020 which was contiguous with the right aspect of her vaginal cuff. She was discharged from the ED with plan on outpatient GYN oncology but she did not have an appointment until later this week. Upon presentation in the ED, blood work showed drop in hemoglobin to 11.5 from 14.4 and chemistry showed mild AKI. GYN oncology and CCS consulted. See below for further details during prolonged hospitalization.   Discharge Diagnoses:  Active Problems:   Anemia   Enterovaginal fistula   Vaginal mass   Abnormal CT scan   Rectovaginal fistula with vaginal irritation/pain -Surgery and GYN oncology consulted. CT biopsy of the pelvic mass done on 9/17.Shows high-grade carcinoma (cannot rule out peritoneal carcinoma) CT of the abdomen done on 9/17 showed cecal mass communicating with the vaginal cuff consistent with a fistula. -According to general surgery, patient would need extensive surgery to remove this mass, including resection of small bowel, cecum, nephrectomy and cystectomy.  Also did not feel that diversion would be of much benefit due to multiple loops of small bowel in the pelvis and ostomy proximal to all of this would be high output, lead to dehydration and renal failure.  -Radiation oncology consulted, did not recommend  palliative radiation directed to the pelvis, felt that this would likely cause worsening of her fistula with tumor shrinkage.  If in the future, patient developed significant vaginal bleeding, could consider palliative radiation at that time.  Currently recommending symptom management only.  Pyuria -Urine culture negative  CKD stage III -Baseline creatinine 1.3 -Stable  Hypotension and bradycardia -Discontinue propanolol  Chronic depression and anxiety -Continue Lexapro and Klonopin  Alzheimer's dementia -Stable. Continue aricept, namenda   Iron deficiency/acute blood loss anemia -Started on iron supplement -Hemoglobin stable  HLD -Continue lipitor   Physical deconditioning -PT recommends SNF   Discharge Instructions  Discharge Instructions    Diet general   Complete by: As directed    Increase activity slowly   Complete by: As directed      Allergies as of 10/28/2018      Reactions   Zocor [simvastatin] Other (See Comments)   Myalgia      Medication List    STOP taking these medications   cephALEXin 500 MG capsule Commonly known as: KEFLEX   nystatin cream Commonly known as: MYCOSTATIN   propranolol ER 60 MG 24 hr capsule Commonly known as: INDERAL LA     TAKE these medications   acetaminophen 325 MG tablet Commonly known as: TYLENOL Take 650 mg by mouth every 4 (four) hours as needed for mild pain or fever.   atorvastatin 10 MG tablet Commonly known as: LIPITOR Take 10 mg by mouth every evening.   clonazePAM 0.5 MG tablet Commonly known as: KLONOPIN Take 1 tablet (0.5 mg total) by mouth 2 (two) times daily for 7 days.   donepezil 10 MG tablet Commonly known as: Aricept Take 1 tablet (  10 mg total) by mouth at bedtime.   escitalopram 10 MG tablet Commonly known as: LEXAPRO Take 10 mg by mouth at bedtime.   Melatonin 5 MG Tabs Take 5 mg by mouth at bedtime.   meloxicam 15 MG tablet Commonly known as: MOBIC Take 15 mg by mouth  daily.   memantine 7 MG Cp24 24 hr capsule Commonly known as: NAMENDA XR Take 7 mg by mouth daily.   nystatin-triamcinolone cream Commonly known as: MYCOLOG II Apply 1 application topically 3 (three) times daily. Apply to vaginal and perineal area What changed: additional instructions   omeprazole 10 MG capsule Commonly known as: PRILOSEC Take 10 mg by mouth daily.   primidone 50 MG tablet Commonly known as: MYSOLINE Take 50 mg by mouth daily.   tolterodine 2 MG 24 hr capsule Commonly known as: DETROL LA Take 2 mg by mouth daily.   traZODone 50 MG tablet Commonly known as: DESYREL Take 50 mg by mouth at bedtime.            Durable Medical Equipment  (From admission, onward)         Start     Ordered   10/17/18 1238  For home use only DME Walker rolling  Once    Comments: 5" wheels  Question:  Patient needs a walker to treat with the following condition  Answer:  Ambulatory dysfunction   10/17/18 1237         Follow-up Information    Bartholome Bill, MD. Schedule an appointment as soon as possible for a visit in 1 week(s).   Specialty: Family Medicine Contact information: [place] [address] [phone]          Allergies  Allergen Reactions  . Zocor [Simvastatin] Other (See Comments)    Myalgia    Consultations:  GYN oncology  General surgery  Radiation oncology     Procedures/Studies: Ct Pelvis W Contrast  Result Date: 10/19/2018 CLINICAL DATA:  73 year old female with burning in the region of the inner thigh. EXAM: CT PELVIS WITH CONTRAST TECHNIQUE: Multidetector CT imaging of the pelvis was performed using the standard protocol following the bolus administration of intravenous contrast. CONTRAST:  3mL OMNIPAQUE IOHEXOL 300 MG/ML  SOLN COMPARISON:  CT of the abdomen pelvis dated 09/23/2018 FINDINGS: Urinary Tract: There is dilatation of the visualized right ureter likely secondary  to obstruction by the right adnexal/pelvic mass as seen on the CT of 09/23/2018. The visualized left ureter appears unremarkable. Bowel: Rectal contrast is noted. There is sigmoid diverticulosis without active inflammatory changes. There is tethering of multiple loops of small bowel to the right adnexal mass consistent with adhesions there is mild dilatation of small-bowel loops in the right hemipelvis measuring up to 3.5 cm indicative of a degree obstruction. Evidence of prior small bowel surgery with anastomotic suture in the left hemipelvis. Vascular/Lymphatic: The abdominal aorta and IVC are grossly unremarkable. No adenopathy. Reproductive: Hysterectomy. There is a 4.9 x 4.6 x 5.0 cm soft tissue mass in the region the right adnexa contiguous with the vaginal cuff. There is complex fluid or air and debris within the center of this mass likely extending from the vagina. There is irregularity and nodularity of the border of these mass with desmoplastic changes and tethering of the adjacent small bowel loops. Other: None no perirectal abscess or inflammatory changes. No fluid collection or soft tissue air. Musculoskeletal: Osteopenia. No acute osseous pathology. Left S2-S3 Tarlov cyst. IMPRESSION:  1. No fluid collection, abscess or soft tissue air in the perirectal region or medial thighs. 2. Right adnexal soft tissue contiguous with the vaginal cuff similar to prior CT. There is associated tethering of the adjacent small bowel loops with probable degree of small-bowel obstruction as well as obstruction of the distal right ureter. MRI may provide better evaluation of this mass. Electronically Signed   By: Anner Crete M.D.   On: 10/19/2018 16:20   Ct Abdomen Pelvis W Contrast  Result Date: 10/20/2018 CLINICAL DATA:  Right pelvic mass with involvement of small intestine, vaginal cuff fistula CT guided biopsy of mass ordered and pending Imaging options discussed with radiology regarding evaluation of source  of fistula - small bowel vs colon EXAM: CT ABDOMEN AND PELVIS WITH CONTRAST TECHNIQUE: Multidetector CT imaging of the abdomen and pelvis was performed using the standard protocol following bolus administration of intravenous contrast. CONTRAST:  22mL OMNIPAQUE IOHEXOL 300 MG/ML SOLN, 71mL OMNIPAQUE IOHEXOL 300 MG/ML SOLN COMPARISON:  CT, 09/23/2018 FINDINGS: Lower chest: Linear atelectasis at the lung bases.  Acute findings. Hepatobiliary: Leretha Dykes of the liver excluded from the field of view. Liver normal in overall size and attenuation. 1.5 cm low-density lesion, segment 4 B. 1.0 cm low-density lesion, segment 2. These are stable consistent with cysts. Subtle low-density lesion noted near the liver dome, segment 7, also likely a cyst. This was more defined as a cyst on the prior CT. No other liver lesions. Status post cholecystectomy. No bile duct dilation. Pancreas: Unremarkable. No pancreatic ductal dilatation or surrounding inflammatory changes. Spleen: Normal in size without focal abnormality. Adrenals/Urinary Tract: No adrenal masses. Right ureter is obstructed an irregular mass along right adnexa. This causes marked right hydronephrosis and moderate right hydroureter. No left hydronephrosis. Left ureter normal in course and in caliber. Stable lower pole left renal cyst. Significant right renal cortical thinning. There is less enhancement of the right kidney compared to the left as well as delayed excretion on the right. Renal findings are stable from prior CT. Stomach/Bowel: There is an irregular mass that appears centered on the upper cecum and lower ascending colon, extending into the right adnexal soft tissues abutting the right superior vaginal cuff and pelvic loops of small bowel. Mass measures approximately 6.2 x 4.2 cm transversely by 6.1 cm superior to inferior. There is a projection of contrast, from what appears to be the cecum lying in the inferior pelvis, extending into the right pelvic mass. There  is also a small bubble of air within the right pelvic mass. A small amount of contrast and a single bubble of air extends within the mass to the right vaginal cuff consistent with a fistula. There are numerous sigmoid colon diverticula without evidence of diverticulitis. No other bowel masses. No wall thickening and no inflammation. There is mild distention loops of mid to distal small bowel with air-fluid levels. No small bowel wall thickening or adjacent inflammation. Stomach is unremarkable. Vascular/Lymphatic: No discrete enlarged lymph nodes. Aortic atherosclerosis. Reproductive: Status post hysterectomy. Other: No hernia.  No ascites. Musculoskeletal: No fracture or acute finding. No osteoblastic or osteolytic lesions. IMPRESSION: 1. No significant change when compared to the previous CT scan. Bowel contrast better defines the right pelvic mass. 2. Some contrast as well as a small amount of air lies within the mass. This appears extraluminal. The defect or fistula appears to arise from the right side of the cecum, which lies in the inferior pelvis. 3. The right pelvic mass appears centered on  the upper cecum and lower ascending colon, consistent a colon carcinoma. Mass is contiguous with the right vaginal cuff. There is a small amount of contrast and a small bubble of air that extends to the right vaginal cuff from the pelvic mass consistent with a fistula. 4. Mass measures approximately 6.2 x 4.2 x 6.1 cm. 5. No evidence of metastatic disease. Electronically Signed   By: Lajean Manes M.D.   On: 10/20/2018 16:06   Ct Biopsy  Result Date: 10/20/2018 INDICATION: GYN malignancy, right vaginal cuff mass with possible adjacent fistula EXAM: CT-GUIDED CORE BIOPSY RIGHT VAGINAL CUFF MASS MEDICATIONS: 1% LIDOCAINE LOCAL ANESTHESIA/SEDATION: 2.0 mg IV Versed; 50 mcg IV Fentanyl Moderate Sedation Time:  12 minutes The patient was continuously monitored during the procedure by the interventional radiology nurse  under my direct supervision. PROCEDURE: The procedure, risks, benefits, and alternatives were explained to the patient. Questions regarding the procedure were encouraged and answered. The patient understands and consents to the procedure. Previous imaging reviewed. Patient position prone. Noncontrast localization CT performed. The right vaginal soft tissue mass was localized and marked for a posterior trans gluteal approach. Under sterile conditions and local anesthesia, a 17 gauge coaxial guide needle was advanced from right posterior trans gluteal approach to the right vaginal cuff mass. Needle position confirmed with CT. 3 18 gauge core biopsies obtained. Samples were intact and non fragmented. These were placed in formalin. Needle removed. Postprocedure imaging demonstrates hematoma. Patient tolerated the procedure well without complication. Vital sign monitoring by nursing staff during the procedure will continue as patient is in the special procedures unit for post procedure observation. FINDINGS: The images document guide needle placement within the right vaginal cuff mass. Post biopsy images demonstrate no hemorrhage or hematoma. COMPLICATIONS: None immediate. IMPRESSION: Successful CT-guided core biopsy of the right vaginal cuff mass Electronically Signed   By: Jerilynn Mages.  Shick M.D.   On: 10/20/2018 09:38       Discharge Exam: Vitals:   10/27/18 2100 10/28/18 0543  BP: (!) 129/57 (!) 114/57  Pulse: 72 (!) 55  Resp: 18 18  Temp: 98.9 F (37.2 C) 97.8 F (36.6 C)  SpO2: 96% 98%    General: Pt is alert, awake, not in acute distress, with some tremors  Cardiovascular: RRR, S1/S2 +, no edema Respiratory: CTA bilaterally, no wheezing, no rhonchi, no respiratory distress, no conversational dyspnea  Abdominal: Soft, NT, ND, bowel sounds + Extremities: no edema, no cyanosis Psych: Mood stable, pleasant    The results of significant diagnostics from this hospitalization (including imaging,  microbiology, ancillary and laboratory) are listed below for reference.     Microbiology: Recent Results (from the past 240 hour(s))  Novel Coronavirus, NAA (Hosp order, Send-out to Ref Lab; TAT 18-24 hrs     Status: None   Collection Time: 10/26/18  4:42 PM   Specimen: Nasopharyngeal Swab; Respiratory  Result Value Ref Range Status   SARS-CoV-2, NAA NOT DETECTED NOT DETECTED Final    Comment: (NOTE) This nucleic acid amplification test was developed and its performance characteristics determined by [employer]. Nucleic acid amplification tests include PCR and TMA. This test has not been FDA cleared or approved. This test has been authorized by FDA under an Emergency Use Authorization (EUA). This test is only authorized for the duration of time the declaration that circumstances exist justifying the authorization of the emergency use of in vitro diagnostic tests for detection of SARS-CoV-2 virus and/or diagnosis of COVID-19 infection under section 564(b)(1) of the Act,  21 U.S.C. 360bbb-3(b) (1), unless the authorization is terminated or revoked sooner. When diagnostic testing is negative, the possibility of a false negative result should be considered in the context of a patient's recent exposures and the presence of clinical signs and symptoms consistent with COVID-19. An individual without symptoms of COVID- 19 and who is not shedding SARS-CoV-2 vi rus would expect to have a negative (not detected) result in this assay. Performed At: [facility] [place], [place] [id number] Rush Farmer MD [id number]    [place]  Final    Comment: Performed at [facility] [address] [place], [place] [address]     Labs: BNP (last 3 results) No results for input(s): BNP in the last 8760 hours. Basic Metabolic Panel: Recent Labs  Lab 10/23/18 0513 10/26/18 0503  NA  --  136  K  --  4.0  CL  --  106   CO2  --  22  GLUCOSE  --  84  BUN  --  20  CREATININE 1.03* 1.25*  CALCIUM  --  8.8*   Liver Function Tests: No results for input(s): AST, ALT, ALKPHOS, BILITOT, PROT, ALBUMIN in the last 168 hours. No results for input(s): LIPASE, AMYLASE in the last 168 hours. No results for input(s): AMMONIA in the last 168 hours. CBC: Recent Labs  Lab 10/26/18 0503  WBC 6.4  HGB 10.4*  HCT 33.4*  MCV 95.7  PLT 353   Cardiac Enzymes: No results for input(s): CKTOTAL, CKMB, CKMBINDEX, TROPONINI in the last 168 hours. BNP: Invalid input(s): POCBNP CBG: No results for input(s): GLUCAP in the last 168 hours. D-Dimer No results for input(s): DDIMER in the last 72 hours. Hgb A1c No results for input(s): HGBA1C in the last 72 hours. Lipid Profile No results for input(s): CHOL, HDL, LDLCALC, TRIG, CHOLHDL, LDLDIRECT in the last 72 hours. Thyroid function studies No results for input(s): TSH, T4TOTAL, T3FREE, THYROIDAB in the last 72 hours.  Invalid input(s): FREET3 Anemia work up No results for input(s): VITAMINB12, FOLATE, FERRITIN, TIBC, IRON, RETICCTPCT in the last 72 hours. Urinalysis    Component Value Date/Time   COLORURINE RED (A) 10/16/2018 1636   APPEARANCEUR CLOUDY (A) 10/16/2018 1636   LABSPEC 1.023 10/16/2018 1636   PHURINE 5.0 10/16/2018 1636   GLUCOSEU NEGATIVE 10/16/2018 1636   HGBUR LARGE (A) 10/16/2018 1636   BILIRUBINUR NEGATIVE 10/16/2018 1636   BILIRUBINUR negative 04/18/2013 1223   KETONESUR 5 (A) 10/16/2018 1636   PROTEINUR 100 (A) 10/16/2018 1636   UROBILINOGEN negative 04/18/2013 1223   UROBILINOGEN 1.0 01/31/2008 0717   NITRITE NEGATIVE 10/16/2018 1636   LEUKOCYTESUR LARGE (A) 10/16/2018 1636   Sepsis Labs Invalid input(s): PROCALCITONIN,  WBC,  LACTICIDVEN Microbiology Recent Results (from the past 240 hour(s))  Novel Coronavirus, NAA (Hosp order, Send-out to Ref Lab; TAT 18-24 hrs     Status: None   Collection Time: 10/26/18  4:42 PM   Specimen:  Nasopharyngeal Swab; Respiratory  Result Value Ref Range Status   SARS-CoV-2, NAA NOT DETECTED NOT DETECTED Final    Comment: (NOTE) This nucleic acid amplification test was developed and its performance characteristics determined by [employer]. Nucleic acid amplification tests include PCR and TMA. This test has not been FDA cleared or approved. This test has been authorized by FDA under an Emergency Use Authorization (EUA). This test is only authorized for the duration of time the declaration that circumstances exist justifying the authorization of the emergency use  of in vitro diagnostic tests for detection of SARS-CoV-2 virus and/or diagnosis of COVID-19 infection under section 564(b)(1) of the Act, 21 U.S.C. [id number]) (1), unless the authorization is terminated or revoked sooner. When diagnostic testing is negative, the possibility of a false negative result should be considered in the context of a patient's recent exposures and the presence of clinical signs and symptoms consistent with COVID-19. An individual without symptoms of COVID- 19 and who is not shedding SARS-CoV-2 vi rus would expect to have a negative (not detected) result in this assay. Performed At: [facility] [place], [place] [id number] Rush Farmer MD [id number]    [place]  Final    Comment: Performed at [facility] [address], [place], [place] [address]     Patient was seen and examined on the day of discharge and was found to be in stable condition. Time coordinating discharge: 35 minutes including assessment and coordination of care, as well as examination of the patient.   SIGNED:  Dessa Phi, DO [facility] 10/28/2018, 10:31 AM

## 2018-11-12 ENCOUNTER — Emergency Department (HOSPITAL_COMMUNITY): Payer: Medicare Other

## 2018-11-12 ENCOUNTER — Inpatient Hospital Stay (HOSPITAL_COMMUNITY)
Admission: EM | Admit: 2018-11-12 | Discharge: 2018-12-04 | DRG: 375 | Disposition: E | Payer: Medicare Other | Source: Skilled Nursing Facility | Attending: Internal Medicine | Admitting: Internal Medicine

## 2018-11-12 ENCOUNTER — Other Ambulatory Visit: Payer: Self-pay

## 2018-11-12 ENCOUNTER — Encounter (HOSPITAL_COMMUNITY): Payer: Self-pay

## 2018-11-12 DIAGNOSIS — C7982 Secondary malignant neoplasm of genital organs: Secondary | ICD-10-CM | POA: Diagnosis present

## 2018-11-12 DIAGNOSIS — Z515 Encounter for palliative care: Secondary | ICD-10-CM | POA: Diagnosis present

## 2018-11-12 DIAGNOSIS — C7B00 Secondary carcinoid tumors, unspecified site: Secondary | ICD-10-CM

## 2018-11-12 DIAGNOSIS — F329 Major depressive disorder, single episode, unspecified: Secondary | ICD-10-CM | POA: Diagnosis present

## 2018-11-12 DIAGNOSIS — N822 Fistula of vagina to small intestine: Secondary | ICD-10-CM

## 2018-11-12 DIAGNOSIS — G250 Essential tremor: Secondary | ICD-10-CM | POA: Diagnosis present

## 2018-11-12 DIAGNOSIS — Z833 Family history of diabetes mellitus: Secondary | ICD-10-CM

## 2018-11-12 DIAGNOSIS — N179 Acute kidney failure, unspecified: Secondary | ICD-10-CM | POA: Diagnosis present

## 2018-11-12 DIAGNOSIS — Z90710 Acquired absence of both cervix and uterus: Secondary | ICD-10-CM

## 2018-11-12 DIAGNOSIS — R197 Diarrhea, unspecified: Secondary | ICD-10-CM | POA: Diagnosis present

## 2018-11-12 DIAGNOSIS — F419 Anxiety disorder, unspecified: Secondary | ICD-10-CM | POA: Diagnosis present

## 2018-11-12 DIAGNOSIS — Z820 Family history of epilepsy and other diseases of the nervous system: Secondary | ICD-10-CM

## 2018-11-12 DIAGNOSIS — E876 Hypokalemia: Secondary | ICD-10-CM | POA: Diagnosis present

## 2018-11-12 DIAGNOSIS — Z20828 Contact with and (suspected) exposure to other viral communicable diseases: Secondary | ICD-10-CM | POA: Diagnosis present

## 2018-11-12 DIAGNOSIS — I129 Hypertensive chronic kidney disease with stage 1 through stage 4 chronic kidney disease, or unspecified chronic kidney disease: Secondary | ICD-10-CM | POA: Diagnosis present

## 2018-11-12 DIAGNOSIS — I469 Cardiac arrest, cause unspecified: Secondary | ICD-10-CM | POA: Diagnosis not present

## 2018-11-12 DIAGNOSIS — N136 Pyonephrosis: Secondary | ICD-10-CM | POA: Diagnosis present

## 2018-11-12 DIAGNOSIS — Z96653 Presence of artificial knee joint, bilateral: Secondary | ICD-10-CM | POA: Diagnosis present

## 2018-11-12 DIAGNOSIS — N939 Abnormal uterine and vaginal bleeding, unspecified: Secondary | ICD-10-CM | POA: Diagnosis present

## 2018-11-12 DIAGNOSIS — Z66 Do not resuscitate: Secondary | ICD-10-CM | POA: Diagnosis present

## 2018-11-12 DIAGNOSIS — F0280 Dementia in other diseases classified elsewhere without behavioral disturbance: Secondary | ICD-10-CM | POA: Diagnosis present

## 2018-11-12 DIAGNOSIS — Z823 Family history of stroke: Secondary | ICD-10-CM

## 2018-11-12 DIAGNOSIS — N3001 Acute cystitis with hematuria: Secondary | ICD-10-CM

## 2018-11-12 DIAGNOSIS — C189 Malignant neoplasm of colon, unspecified: Principal | ICD-10-CM | POA: Diagnosis present

## 2018-11-12 DIAGNOSIS — M6530 Trigger finger, unspecified finger: Secondary | ICD-10-CM | POA: Diagnosis present

## 2018-11-12 DIAGNOSIS — N390 Urinary tract infection, site not specified: Secondary | ICD-10-CM | POA: Diagnosis present

## 2018-11-12 DIAGNOSIS — E785 Hyperlipidemia, unspecified: Secondary | ICD-10-CM | POA: Diagnosis present

## 2018-11-12 DIAGNOSIS — Z79899 Other long term (current) drug therapy: Secondary | ICD-10-CM

## 2018-11-12 DIAGNOSIS — Z8249 Family history of ischemic heart disease and other diseases of the circulatory system: Secondary | ICD-10-CM

## 2018-11-12 DIAGNOSIS — G249 Dystonia, unspecified: Secondary | ICD-10-CM | POA: Diagnosis present

## 2018-11-12 DIAGNOSIS — N828 Other female genital tract fistulae: Secondary | ICD-10-CM | POA: Diagnosis present

## 2018-11-12 DIAGNOSIS — N823 Fistula of vagina to large intestine: Secondary | ICD-10-CM | POA: Diagnosis present

## 2018-11-12 DIAGNOSIS — G309 Alzheimer's disease, unspecified: Secondary | ICD-10-CM | POA: Diagnosis present

## 2018-11-12 DIAGNOSIS — C7911 Secondary malignant neoplasm of bladder: Secondary | ICD-10-CM | POA: Diagnosis present

## 2018-11-12 DIAGNOSIS — K219 Gastro-esophageal reflux disease without esophagitis: Secondary | ICD-10-CM | POA: Diagnosis present

## 2018-11-12 DIAGNOSIS — M8580 Other specified disorders of bone density and structure, unspecified site: Secondary | ICD-10-CM | POA: Diagnosis present

## 2018-11-12 DIAGNOSIS — D490 Neoplasm of unspecified behavior of digestive system: Secondary | ICD-10-CM | POA: Diagnosis present

## 2018-11-12 DIAGNOSIS — N183 Chronic kidney disease, stage 3 unspecified: Secondary | ICD-10-CM | POA: Diagnosis present

## 2018-11-12 DIAGNOSIS — M1990 Unspecified osteoarthritis, unspecified site: Secondary | ICD-10-CM | POA: Diagnosis present

## 2018-11-12 DIAGNOSIS — Z888 Allergy status to other drugs, medicaments and biological substances status: Secondary | ICD-10-CM

## 2018-11-12 DIAGNOSIS — B9689 Other specified bacterial agents as the cause of diseases classified elsewhere: Secondary | ICD-10-CM | POA: Diagnosis present

## 2018-11-12 LAB — CBC WITH DIFFERENTIAL/PLATELET
Abs Immature Granulocytes: 0.1 10*3/uL — ABNORMAL HIGH (ref 0.00–0.07)
Basophils Absolute: 0.1 10*3/uL (ref 0.0–0.1)
Basophils Relative: 1 %
Eosinophils Absolute: 0.1 10*3/uL (ref 0.0–0.5)
Eosinophils Relative: 1 %
HCT: 41.1 % (ref 36.0–46.0)
Hemoglobin: 13.3 g/dL (ref 12.0–15.0)
Immature Granulocytes: 1 %
Lymphocytes Relative: 8 %
Lymphs Abs: 1.1 10*3/uL (ref 0.7–4.0)
MCH: 28.9 pg (ref 26.0–34.0)
MCHC: 32.4 g/dL (ref 30.0–36.0)
MCV: 89.2 fL (ref 80.0–100.0)
Monocytes Absolute: 1.3 10*3/uL — ABNORMAL HIGH (ref 0.1–1.0)
Monocytes Relative: 10 %
Neutro Abs: 10.4 10*3/uL — ABNORMAL HIGH (ref 1.7–7.7)
Neutrophils Relative %: 79 %
Platelets: 646 10*3/uL — ABNORMAL HIGH (ref 150–400)
RBC: 4.61 MIL/uL (ref 3.87–5.11)
RDW: 12.9 % (ref 11.5–15.5)
WBC: 13 10*3/uL — ABNORMAL HIGH (ref 4.0–10.5)
nRBC: 0 % (ref 0.0–0.2)

## 2018-11-12 LAB — COMPREHENSIVE METABOLIC PANEL
ALT: 13 U/L (ref 0–44)
AST: 21 U/L (ref 15–41)
Albumin: 4.2 g/dL (ref 3.5–5.0)
Alkaline Phosphatase: 82 U/L (ref 38–126)
Anion gap: 15 (ref 5–15)
BUN: 41 mg/dL — ABNORMAL HIGH (ref 8–23)
CO2: 18 mmol/L — ABNORMAL LOW (ref 22–32)
Calcium: 9.7 mg/dL (ref 8.9–10.3)
Chloride: 102 mmol/L (ref 98–111)
Creatinine, Ser: 1.8 mg/dL — ABNORMAL HIGH (ref 0.44–1.00)
GFR calc Af Amer: 31 mL/min — ABNORMAL LOW (ref >60)
GFR calc non Af Amer: 27 mL/min — ABNORMAL LOW (ref >60)
Glucose, Bld: 113 mg/dL — ABNORMAL HIGH (ref 70–99)
Potassium: 4.2 mmol/L (ref 3.5–5.1)
Sodium: 135 mmol/L (ref 135–145)
Total Bilirubin: 0.6 mg/dL (ref 0.3–1.2)
Total Protein: 7.1 g/dL (ref 6.5–8.1)

## 2018-11-12 LAB — URINALYSIS, ROUTINE W REFLEX MICROSCOPIC
Bilirubin Urine: NEGATIVE
Glucose, UA: NEGATIVE mg/dL
Ketones, ur: 5 mg/dL — AB
Nitrite: NEGATIVE
Protein, ur: 100 mg/dL — AB
RBC / HPF: >50 RBC/hpf — ABNORMAL HIGH (ref 0–5)
Specific Gravity, Urine: 1.021 (ref 1.005–1.030)
pH: 5 (ref 5.0–8.0)

## 2018-11-12 LAB — SARS CORONAVIRUS 2 (TAT 6-24 HRS): SARS Coronavirus 2: NEGATIVE

## 2018-11-12 LAB — LACTIC ACID, PLASMA
Lactic Acid, Venous: 1.1 mmol/L (ref 0.5–1.9)
Lactic Acid, Venous: 0.9 mmol/L (ref 0.5–1.9)

## 2018-11-12 MED ORDER — HALOPERIDOL LACTATE 5 MG/ML IJ SOLN: 2.0000 mg | INTRAMUSCULAR | Status: DC | PRN

## 2018-11-12 MED ORDER — NYSTATIN-TRIAMCINOLONE 100000-0.1 UNIT/GM-% EX CREA
1.0000 "application " | TOPICAL_CREAM | Freq: Four times a day (QID) | CUTANEOUS | Status: DC
  Administered 2018-11-12 – 2018-11-13 (×5): 1 via TOPICAL
  Filled 2018-11-12 (×2): qty 30

## 2018-11-12 MED ORDER — GLYCOPYRROLATE 0.2 MG/ML IJ SOLN: 0.2000 mg | INTRAMUSCULAR | Status: DC | PRN

## 2018-11-12 MED ORDER — ESCITALOPRAM OXALATE 10 MG PO TABS
10.0000 mg | ORAL_TABLET | Freq: Every day | ORAL | Status: DC
  Administered 2018-11-12 – 2018-11-13 (×2): 10 mg via ORAL
  Filled 2018-11-12 (×2): qty 1

## 2018-11-12 MED ORDER — LORAZEPAM 2 MG/ML PO CONC: 1.0000 mg | ORAL | Status: DC | PRN

## 2018-11-12 MED ORDER — HYDROMORPHONE HCL 1 MG/ML IJ SOLN
0.5000 mg | INTRAMUSCULAR | Status: DC | PRN
  Administered 2018-11-12 – 2018-11-13 (×4): 0.5 mg via INTRAVENOUS
  Filled 2018-11-12 (×4): qty 1

## 2018-11-12 MED ORDER — OXYBUTYNIN CHLORIDE 5 MG PO TABS: 2.5000 mg | ORAL_TABLET | Freq: Four times a day (QID) | ORAL | Status: DC | PRN

## 2018-11-12 MED ORDER — SODIUM CHLORIDE 0.9 % IV BOLUS
1000.0000 mL | Freq: Once | INTRAVENOUS | Status: AC
  Administered 2018-11-12: 1000 mL via INTRAVENOUS

## 2018-11-12 MED ORDER — OXYCODONE HCL 20 MG/ML PO CONC: 5.0000 mg | ORAL | Status: DC | PRN

## 2018-11-12 MED ORDER — PRIMIDONE 50 MG PO TABS: 50.0000 mg | ORAL_TABLET | Freq: Every day | ORAL | Status: DC

## 2018-11-12 MED ORDER — ACETAMINOPHEN 650 MG RE SUPP: 650.0000 mg | Freq: Four times a day (QID) | RECTAL | Status: DC | PRN

## 2018-11-12 MED ORDER — LORAZEPAM 2 MG/ML IJ SOLN: 1.0000 mg | INTRAMUSCULAR | Status: DC | PRN

## 2018-11-12 MED ORDER — DONEPEZIL HCL 5 MG PO TABS
10.0000 mg | ORAL_TABLET | Freq: Every day | ORAL | Status: DC
  Administered 2018-11-12: 5 mg via ORAL
  Administered 2018-11-13: 10 mg via ORAL
  Filled 2018-11-12 (×3): qty 2

## 2018-11-12 MED ORDER — ONDANSETRON HCL 4 MG/2ML IJ SOLN: 4.0000 mg | Freq: Four times a day (QID) | INTRAMUSCULAR | Status: DC | PRN

## 2018-11-12 MED ORDER — HALOPERIDOL 1 MG PO TABS
2.0000 mg | ORAL_TABLET | ORAL | Status: DC | PRN
  Filled 2018-11-12: qty 2

## 2018-11-12 MED ORDER — DIPHENHYDRAMINE HCL 50 MG/ML IJ SOLN
12.5000 mg | INTRAMUSCULAR | Status: DC | PRN
  Administered 2018-11-12: 12.5 mg via INTRAVENOUS
  Filled 2018-11-12: qty 1

## 2018-11-12 MED ORDER — PRIMIDONE 50 MG PO TABS
75.0000 mg | ORAL_TABLET | Freq: Every day | ORAL | Status: DC
  Administered 2018-11-13: 75 mg via ORAL
  Filled 2018-11-12 (×3): qty 1.5

## 2018-11-12 MED ORDER — LOPERAMIDE HCL 2 MG PO CAPS: 2.0000 mg | ORAL_CAPSULE | ORAL | Status: DC | PRN

## 2018-11-12 MED ORDER — ACETAMINOPHEN 325 MG PO TABS: 650.0000 mg | ORAL_TABLET | Freq: Four times a day (QID) | ORAL | Status: DC | PRN

## 2018-11-12 MED ORDER — GLYCOPYRROLATE 1 MG PO TABS: 1.0000 mg | ORAL_TABLET | ORAL | Status: DC | PRN

## 2018-11-12 MED ORDER — ONDANSETRON 4 MG PO TBDP: 4.0000 mg | ORAL_TABLET | Freq: Four times a day (QID) | ORAL | Status: DC | PRN

## 2018-11-12 MED ORDER — POLYVINYL ALCOHOL 1.4 % OP SOLN
1.0000 [drp] | Freq: Four times a day (QID) | OPHTHALMIC | Status: DC | PRN
  Filled 2018-11-12: qty 15

## 2018-11-12 MED ORDER — SODIUM CHLORIDE 0.9 % IV SOLN
1.0000 g | Freq: Once | INTRAVENOUS | Status: AC
  Administered 2018-11-12: 1 g via INTRAVENOUS
  Filled 2018-11-12: qty 10

## 2018-11-12 MED ORDER — HALOPERIDOL LACTATE 2 MG/ML PO CONC
2.0000 mg | ORAL | Status: DC | PRN
  Filled 2018-11-12: qty 1

## 2018-11-12 MED ORDER — ACETAMINOPHEN 500 MG PO TABS
1000.0000 mg | ORAL_TABLET | Freq: Once | ORAL | Status: AC
  Administered 2018-11-12: 1000 mg via ORAL
  Filled 2018-11-12: qty 2

## 2018-11-12 MED ORDER — TRAZODONE HCL 50 MG PO TABS
50.0000 mg | ORAL_TABLET | Freq: Every day | ORAL | Status: DC
  Administered 2018-11-12 – 2018-11-13 (×2): 50 mg via ORAL
  Filled 2018-11-12 (×2): qty 1

## 2018-11-12 MED ORDER — LORAZEPAM 1 MG PO TABS
1.0000 mg | ORAL_TABLET | ORAL | Status: DC | PRN
  Administered 2018-11-12: 1 mg via ORAL
  Filled 2018-11-12: qty 1

## 2018-11-12 MED ORDER — MEMANTINE HCL ER 7 MG PO CP24
7.0000 mg | ORAL_CAPSULE | Freq: Every day | ORAL | Status: DC
  Administered 2018-11-13: 7 mg via ORAL
  Filled 2018-11-12 (×3): qty 1

## 2018-11-12 MED ORDER — MAGIC MOUTHWASH
15.0000 mL | Freq: Four times a day (QID) | ORAL | Status: DC | PRN
  Filled 2018-11-12: qty 15

## 2018-11-12 MED ORDER — FESOTERODINE FUMARATE ER 4 MG PO TB24: 4.0000 mg | ORAL_TABLET | Freq: Every day | ORAL | Status: DC

## 2018-11-12 NOTE — H&P (Signed)
[facility] History and Physical   Patient: Alyssa Macdonald [id number]   PCP: Bartholome Bill, MD DOB: 1945/03/18   DOA: 11/19/2018   DOS: 11/24/2018   DOS: the patient was seen and examined on 11/28/2018  Patient coming from: The patient is coming from Home  Chief Complaint: Vaginal fecal discharge  HPI: Alyssa Macdonald is a 73 y.o. female with Past medical history of Alzheimer's dementia, hysterectomy, cecal high-grade carcinoma with rectovaginal fistula, chronic kidney disease stage III. Patient presents from memory care unit SNF with complaints of vaginal burning and discharge. Patient was recently hospitalized and discharged on 10/27/2018 after extensive evaluation about rectovaginal fistula in the hospital. General surgery and GYN oncology were consulted who both felt that the patient is not a surgical candidate for any intervention given the extensive nature of the surgery required to treat her condition. Radiation oncology was consulted who felt that treating her with radiation would lead to tumor shrinkage and can increase her fistula and therefore I would not recommend that unless she has extensive vaginal bleeding. Discussed with patient's son there plan was after reaching memory care unit eventually discussed with hospice. Today the patient is brought to the hospital because of bleeding from the vaginal area and pain which was ongoing for last 3 days ago. No fever no chills. Reportedly patient is more confused than her baseline.  ED Course: Patient underwent CT abdomen and pelvis secondary to abdominal pain.  CT scan shows previously noted cecal mass invading the vaginal cuff and urinary bladder as well as market right hydronephrosis and hydroureter.  At her baseline ambulates with assistance and wheelchair He is not independent for most of her ADL;  Does not manages her medication on her own.  Review of Systems: as mentioned in the history of present illness.    All other systems reviewed and are negative.  Past Medical History:  Diagnosis Date   Alzheimer's disease ([place])    Cervical dystonia    Complication of anesthesia    slow to awaken   Depression    Endometriosis    s/p total hysterectomy   Esophageal reflux    Essential and other specified forms of tremor    GERD (gastroesophageal reflux disease)    Hyperlipidemia    Hypertension    Hypopotassemia    Osteoarthrosis involving, or with mention of more than one site, but not specified as generalized, site unspecified(715.80)    Osteoarthrosis involving, or with mention of more than one site, but not specified as generalized, site unspecified(715.80)    Other disorders of bone and cartilage(733.99)    Tremors of nervous system    Trigger finger    Past Surgical History:  Procedure Laterality Date   APPENDECTOMY     CATARACT EXTRACTION Bilateral    OD 07/03/2015, OS 05/29/2015   CHOLECYSTECTOMY     COLON SURGERY     bowel resection   FOOT SURGERY     JOINT REPLACEMENT     bilateral   TONSILLECTOMY     TOTAL ABDOMINAL HYSTERECTOMY     TOTAL KNEE ARTHROPLASTY     Bilateral   TRIGGER FINGER RELEASE  02/01/2012   Procedure: RELEASE TRIGGER FINGER/A-1 PULLEY;  Surgeon: Jolyn Nap, MD;  Location: [place];  Service: Orthopedics;  Laterality: Right;  RIGHT LONG FINGER  TRIGGER FINGER RELEASE   WISDOM TOOTH EXTRACTION     Social History:  reports that she has never smoked. She has never used smokeless tobacco.  She reports current alcohol use. She reports that she does not use drugs.  Allergies  Allergen Reactions   Zocor [Simvastatin] Other (See Comments)    Myalgia    Family history reviewed and not pertinent Family History  Problem Relation Age of Onset   CVA Mother    Alzheimer's disease Mother    Diabetes Father    Heart disease Father    Parkinson's disease Paternal Uncle    Cataracts Sister    Breast cancer  Neg Hx      Prior to Admission medications   Medication Sig Start Date End Date Taking? Authorizing Provider  acetaminophen (TYLENOL) 325 MG tablet Take 650 mg by mouth every 4 (four) hours as needed for mild pain or fever.   Yes [provider]  atorvastatin (LIPITOR) 10 MG tablet Take 10 mg by mouth every evening.   Yes [provider]  donepezil (ARICEPT) 10 MG tablet Take 1 tablet (10 mg total) by mouth at bedtime. 02/15/17  Yes Star Age, MD  escitalopram (LEXAPRO) 10 MG tablet Take 10 mg by mouth at bedtime.   Yes [provider]  Melatonin 5 MG TABS Take 5 mg by mouth at bedtime.   Yes [provider]  meloxicam (MOBIC) 15 MG tablet Take 15 mg by mouth daily.   Yes [provider]  memantine (NAMENDA XR) 7 MG CP24 24 hr capsule Take 7 mg by mouth daily.  08/10/17  Yes [provider]  nystatin-triamcinolone (MYCOLOG II) cream Apply 1 application topically 3 (three) times daily. Apply to vaginal and perineal area 10/28/18  Yes Dessa Phi, DO  primidone (MYSOLINE) 50 MG tablet Take 50 mg by mouth daily.   Yes [provider]  tolterodine (DETROL LA) 2 MG 24 hr capsule Take 2 mg by mouth daily.   Yes [provider]  traZODone (DESYREL) 50 MG tablet Take 50 mg by mouth at bedtime.  05/23/15  Yes [provider]    Physical Exam: Vitals:   11/30/2018 1030 11/24/2018 1130 11/08/2018 1203 11/16/2018 1643  BP: 109/68 (!) 126/58  138/77  Pulse: 73 72  78  Resp: 15 15  16   Temp:   98.6 F (37 C) 98.3 F (36.8 C)  TempSrc:   Rectal Oral  SpO2: 97% 100%  98%  Weight:    76.1 kg  Height:    5\' 6"  (1.676 m)    General: alert and oriented to place and person. Appear in mild distress, affect depressed Eyes: PERRL, Conjunctiva normal ENT: Oral Mucosa Clear, dry  Neck: no JVD, no Abnormal Mass Or lumps Cardiovascular: S1 and S2 Present, no Murmur, peripheral pulses symmetrical Respiratory: good respiratory effort,  Bilateral Air entry equal and Decreased, no signs of accessory muscle use, Clear to Auscultation, no Crackles, no wheezes Abdomen: Bowel Sound pesent, Soft and mild  tenderness, no hernia.  Greenish-black fecal material in her private area as well as significant redness in the perineal area. Skin: no rashes  Extremities: bilateral  Pedal edema, no calf tenderness Neurologic: without any new focal findings Gait not checked due to patient safety concerns  Data Reviewed: I have personally reviewed and interpreted labs, imaging as discussed below.  CBC: Recent Labs  Lab 11/11/2018 0941  WBC 13.0*  NEUTROABS 10.4*  HGB 13.3  HCT 41.1  MCV 89.2  PLT [id number]*   Basic Metabolic Panel: Recent Labs  Lab 11/11/2018 0941  NA 135  K 4.2  CL 102  CO2 18*  GLUCOSE 113*  BUN 41*  CREATININE 1.80*  CALCIUM 9.7   GFR: Estimated Creatinine Clearance: 28.1 mL/min (A) (by C-G formula based on SCr of 1.8 mg/dL (H)). Liver Function Tests: Recent Labs  Lab 11/13/2018 0941  AST 21  ALT 13  ALKPHOS 82  BILITOT 0.6  PROT 7.1  ALBUMIN 4.2   No results for input(s): LIPASE, AMYLASE in the last 168 hours. No results for input(s): AMMONIA in the last 168 hours. Coagulation Profile: No results for input(s): INR, PROTIME in the last 168 hours. Cardiac Enzymes: No results for input(s): CKTOTAL, CKMB, CKMBINDEX, TROPONINI in the last 168 hours. BNP (last 3 results) No results for input(s): PROBNP in the last 8760 hours. HbA1C: No results for input(s): HGBA1C in the last 72 hours. CBG: No results for input(s): GLUCAP in the last 168 hours. Lipid Profile: No results for input(s): CHOL, HDL, LDLCALC, TRIG, CHOLHDL, LDLDIRECT in the last 72 hours. Thyroid Function Tests: No results for input(s): TSH, T4TOTAL, FREET4, T3FREE, THYROIDAB in the last 72 hours. Anemia Panel: No results for input(s): VITAMINB12, FOLATE, FERRITIN, TIBC, IRON, RETICCTPCT in the last 72 hours. Urine analysis:    Component  Value Date/Time   COLORURINE AMBER (A) 11/17/2018 0941   APPEARANCEUR CLOUDY (A) 11/17/2018 0941   LABSPEC 1.021 11/05/2018 0941   PHURINE 5.0 11/08/2018 0941   GLUCOSEU NEGATIVE 11/19/2018 0941   HGBUR LARGE (A) 11/29/2018 0941   BILIRUBINUR NEGATIVE 11/13/2018 0941   BILIRUBINUR negative 04/18/2013 1223   KETONESUR 5 (A) 11/20/2018 0941   PROTEINUR 100 (A) 11/27/2018 0941   UROBILINOGEN negative 04/18/2013 1223   UROBILINOGEN 1.0 01/31/2008 0717   NITRITE NEGATIVE 11/29/2018 0941   LEUKOCYTESUR MODERATE (A) 11/07/2018 0941    Radiological Exams on Admission: Ct Abdomen Pelvis Wo Contrast  Result Date: 11/26/2018 CLINICAL DATA:  Acute, diffuse abdominal pain. Known cecal mass eroding into the vagina on the right with a vaginal fistula. EXAM: CT ABDOMEN AND PELVIS WITHOUT CONTRAST TECHNIQUE: Multidetector CT imaging of the abdomen and pelvis was performed following the standard protocol without IV contrast. COMPARISON:  10/20/2018 FINDINGS: Lower chest: Small amount of linear atelectasis or scarring in the right lower lobe. Normal sized heart. Hepatobiliary: Cholecystectomy clips. Stable too small liver cysts. Pancreas: Unremarkable. No pancreatic ductal dilatation or surrounding inflammatory changes. Spleen: Normal in size without focal abnormality. Adrenals/Urinary Tract: Normal appearing adrenal glands. Stable marked dilatation of the right renal collecting system and moderate dilatation of the ureter to the level of the cecal mass. Stable marked thinning of the right renal parenchyma. Stable lower pole left renal parapelvic cyst. The sickle mass extends to the dome of the urinary bladder on the right with possible invasion of the bladder and tethering of the bladder PICC Stomach/Bowel: Small hiatal hernia. Previously noted cecal mass abutting the vagina and urinary bladder on the right without a fat plane separating them. No bowel dilatation. Surgically absent appendix. Vascular/Lymphatic:  Mild atheromatous arterial calcifications. No enlarged lymph nodes. Reproductive: Surgically absent uterus. Thickening of the vaginal cuff superiorly on the right with an appearance compatible with invasion of the vaginal cuff by the adjacent cecal mass. No adnexal masses are seen. Other: Very small proximal left inguinal hernia containing fat. Musculoskeletal: Mild lumbar spine facet degenerative changes. No evidence of bony metastatic disease. IMPRESSION: 1. No acute abnormality. 2. Previously noted cecal mass abutting and probably of invading the vaginal cuff and urinary bladder on the right. 3. Stable marked right hydronephrosis and hydroureter to the level of  the obstructing cecal mass with associated marked renal atrophy. 4. Small hiatal hernia. Electronically Signed   By: Claudie Revering M.D.   On: 11/23/2018 12:01    I reviewed all nursing notes, pharmacy notes, vitals, pertinent old records.  Assessment/Plan 1. 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 Patient not a candidate for any surgical intervention. Radiation oncology felt that palliative radiation will likely cause more damage and worsen her fistula.  Recommend palliative radiation only in the setting of severe vaginal bleeding. Patient presents with possible UTI as well as significant irritation of the perineal area. At present I believe that this is going to be a recurrent issue for this patient. Unfortunately we do not have any medical intervention which can help this patient and provide any meaningful longer stay outside of hospital. Suspect that she will eventually succumb to serious UTI or have significant vaginal bleeding. Currently appears to be suffering from significant pain. I have an extensive discussion with family regarding goals of care on the phone. Son who is POA  mentions that their initial plan was to discuss with hospice at her SNF and initiate care there. I explained the hospice physiology I explained that the terminal nature of her illness and possible recurrent nature of UTI and AKI. At present son agrees that transitioning to complete comfort as well as hospice is in patient's best interest. Based on that discussion we will admit the patient for pain control and further assessment regarding appropriate venue for discharge whether it is memory care unit with hospice or residential hospice.  2.  Essential tremor Continue home medication in fact actually will increase it from 50 to 75 mg.  3.  Dementia. For now we will continue Aricept and Namenda but low threshold to discontinue this medication. Continue Lexapro.  4.  Bladder spasm. PRN oxybutynin.  5.  Diarrhea. Patient likely to have high output diarrhea as she has fistula coming from cecum. Imodium as needed.  Nutrition: Regular diet DVT Prophylaxis: Comfort care  Advance goals of care discussion: DNR comfort care  Consults: Palliative care  Family Communication: no family was present at bedside, at the time of interview.  Discussed with son on the phone, Opportunity was given to ask question and all questions were answered satisfactorily.  Disposition: Admitted as observation, med-surge unit. Likely to be discharged be determined, in 1-2 days.  I have discussed plan of care as described above with RN and patient/family.  Author: Berle Mull, MD [facility] 11/25/2018 7:09 PM   To reach On-call, see care teams to locate the attending and reach out to them via [email or web address]. If 7PM-7AM, please contact night-coverage If you still have difficulty reaching the attending provider, please page the [facility] (Director on Call) for [facility] on amion for assistance.

## 2018-11-12 NOTE — ED Notes (Signed)
Patient transported to CT 

## 2018-11-12 NOTE — ED Triage Notes (Signed)
Patient arrives to [facility] with a complain of bleeding from vaginal area and pain started 3 days ago. Per ems report patient have hx of vaginal mass and tremors. Hx of alzheimer's disease.

## 2018-11-12 NOTE — ED Provider Notes (Signed)
[facility] DEPT Provider Note   CSN: [id number] Arrival date & time: 11/17/2018  [id number]     History   Chief Complaint Chief Complaint  Patient presents with   Vaginal Bleeding    HPI Alyssa Macdonald is a 73 y.o. female.     Alyssa Macdonald is a 73 y.o. 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  Per skilled nursing facility they have noticed increasing bleeding and discharge over the past 3 days and patient has been complaining of discomfort in this area.  Patient complaining of burning discomfort in this area.  Facility did not report any fevers.  Patient denies abdominal pain, chest pain, shortness of breath or cough.  She is able to provide limited history due to dementia.  During recent admission in mid September patient was seen by Dr. Denman George with gynecologic oncology who did not feel that patient was a surgical candidate given her dementia, she was also evaluated by general surgery, who also saw and evaluated patient, they did not feel that she was a good candidate for bowel diversion as it would likely be high output and lead to dehydration and renal failure.  Recommended barrier creams to the perineum and a radiation oncology consult, patient was seen outpatient by Dr. Sondra Come with Rad Onc who did not think patient was a candidate for palliative radiation therapy at this time as it would likely cause worsening of the fistula.  Level 5 caveat: Dementia    Past Medical History:  Diagnosis Date   Alzheimer's disease ([place])    Cervical dystonia    Complication of anesthesia    slow to awaken   Depression    Endometriosis    s/p total hysterectomy   Esophageal reflux    Essential and other specified forms of tremor    GERD (gastroesophageal reflux disease)    Hyperlipidemia     Hypertension    Hypopotassemia    Osteoarthrosis involving, or with mention of more than one site, but not specified as generalized, site unspecified(715.80)    Osteoarthrosis involving, or with mention of more than one site, but not specified as generalized, site unspecified(715.80)    Other disorders of bone and cartilage(733.99)    Tremors of nervous system    Trigger finger     Patient Active Problem List   Diagnosis Date Noted   Enterovaginal fistula    Vaginal mass    Abnormal CT scan    Anemia    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   Abnormal urine odor 06/18/2013   Foul smelling urine 06/18/2013   Vaginitis and vulvovaginitis, unspecified 06/18/2013   Need for prophylactic vaccination and inoculation against influenza 12/11/2012   Knee pain, chronic 12/11/2012   GERD (gastroesophageal reflux disease) 12/11/2012   Other and unspecified hyperlipidemia 12/11/2012   Stress due to illness of family member 11/05/2012   Cervical  dystonia 11/01/2012   Segmental dystonia 10/12/2012   Obesity (BMI 30-39.9) 10/11/2012   Tremors of nervous system 09/10/2012   Other malaise and fatigue 09/10/2012   Leg cramps 09/10/2012   Osteopenia 09/10/2012    Past Surgical History:  Procedure Laterality Date   APPENDECTOMY     CATARACT EXTRACTION Bilateral    OD 07/03/2015, OS 05/29/2015   CHOLECYSTECTOMY     COLON SURGERY     bowel resection    FOOT SURGERY     JOINT REPLACEMENT     bilateral   TONSILLECTOMY     TOTAL ABDOMINAL HYSTERECTOMY     TOTAL KNEE ARTHROPLASTY     Bilateral   TRIGGER FINGER RELEASE  02/01/2012   Procedure: RELEASE TRIGGER FINGER/A-1 PULLEY;  Surgeon: Jolyn Nap, MD;  Location: [facility];  Service: Orthopedics;  Laterality: Right;  RIGHT LONG FINGER  TRIGGER FINGER RELEASE   WISDOM TOOTH EXTRACTION       OB History   No obstetric history on file.      Home Medications    Prior to Admission medications   Medication Sig Start Date End Date Taking? Authorizing Provider  acetaminophen (TYLENOL) 325 MG tablet Take 650 mg by mouth every 4 (four) hours as needed for mild pain or fever.    [provider]  atorvastatin (LIPITOR) 10 MG tablet Take 10 mg by mouth every evening.    [provider]  clonazePAM (KLONOPIN) 0.5 MG tablet Take 1 tablet (0.5 mg total) by mouth 2 (two) times daily for 7 days. 10/28/18 11/04/18  Dessa Phi, DO  donepezil (ARICEPT) 10 MG tablet Take 1 tablet (10 mg total) by mouth at bedtime. 02/15/17   Star Age, MD  escitalopram (LEXAPRO) 10 MG tablet Take 10 mg by mouth at bedtime.    [provider]  Melatonin 5 MG TABS Take 5 mg by mouth at bedtime.    [provider]  meloxicam (MOBIC) 15 MG tablet Take 15 mg by mouth daily.    [provider]  memantine (NAMENDA XR) 7 MG CP24 24 hr capsule Take 7 mg by mouth daily.  08/10/17   [provider]  nystatin-triamcinolone (MYCOLOG II) cream Apply 1 application topically 3 (three) times daily. Apply to vaginal and perineal area 10/28/18   Dessa Phi, DO  omeprazole (PRILOSEC) 10 MG capsule Take 10 mg by mouth daily.    [provider]  primidone (MYSOLINE) 50 MG tablet Take 50 mg by mouth daily.    [provider]  tolterodine (DETROL LA) 2 MG 24 hr capsule Take 2 mg by mouth daily.    [provider]  traZODone (DESYREL)  50 MG tablet Take 50 mg by mouth at bedtime.  05/23/15   [provider]    Family History Family History  Problem Relation Age of Onset   CVA Mother    Alzheimer's disease Mother    Diabetes Father    Heart disease Father    Parkinson's disease Paternal Uncle    Cataracts Sister    Breast cancer Neg Hx     Social History Social History   Tobacco Use   Smoking status: Never Smoker   Smokeless tobacco: Never Used  Substance Use Topics   Alcohol use: Yes    Comment: Occasional,one glass of wine weekly   Drug use: No     Allergies   Zocor [simvastatin]   Review of Systems Review of Systems  Unable to perform ROS:  Dementia     Physical Exam Updated Vital Signs BP (!) 118/103 (BP Location: Right Arm)    Pulse 96    Temp 98 F (36.7 C) (Oral)    Resp (!) 21    SpO2 97%   Physical Exam Vitals signs and nursing note reviewed. Exam conducted with a chaperone present.  Constitutional:      General: She is not in acute distress.    Appearance: She is well-developed and normal weight. She is ill-appearing. She is not diaphoretic.     Comments: Pleasantly demented, alert, somewhat ill-appearing and pale.  HENT:     Head: Normocephalic and atraumatic.     Mouth/Throat:     Comments: Mucous membranes dry Eyes:     General:        Right eye: No discharge.        Left eye: No discharge.     Pupils: Pupils are equal, round, and reactive to light.  Neck:     Musculoskeletal: Neck supple.  Cardiovascular:     Rate and Rhythm: Normal rate and regular rhythm.     Heart sounds: Normal heart sounds. No murmur. No friction rub. No gallop.   Pulmonary:     Effort: Pulmonary effort is normal. No respiratory distress.     Breath sounds: Normal breath sounds. No wheezing or rales.     Comments: Respirations equal and unlabored, patient able to speak in full sentences, lungs clear to auscultation bilaterally Abdominal:     General: Bowel sounds are normal.  There is no distension.     Palpations: Abdomen is soft. There is no mass.     Tenderness: There is no abdominal tenderness. There is no guarding.     Comments: Abdomen soft, nondistended, nontender to palpation in all quadrants without guarding or peritoneal signs  Genitourinary:    Comments: Chaperone present during pelvic exam. There is significant erythema and skin breakdown of the pubic and perineal regions.  There is copious amounts of feculent discharge coming from the vagina, speculum exam reveals fistula in the right posterior wall of the vagina with constant feculent discharge but no evident bleeding. Musculoskeletal:        General: No deformity.  Skin:    General: Skin is warm and dry.     Capillary Refill: Capillary refill takes less than 2 seconds.  Neurological:     Mental Status: She is alert.     Coordination: Coordination normal.     Comments: Speech is clear, able to follow commands Moves extremities without ataxia, coordination intact  Psychiatric:        Mood and Affect: Mood normal.        Behavior: Behavior normal.      ED Treatments / Results  Labs (all labs ordered are listed, but only abnormal results are displayed) Labs Reviewed  CBC WITH DIFFERENTIAL/PLATELET - Abnormal; Notable for the following components:      Result Value   WBC 13.0 (*)    Platelets 646 (*)    Neutro Abs 10.4 (*)    Monocytes Absolute 1.3 (*)    Abs Immature Granulocytes 0.10 (*)    All other components within normal limits  COMPREHENSIVE METABOLIC PANEL - Abnormal; Notable for the following components:   CO2 18 (*)    Glucose, Bld 113 (*)    BUN 41 (*)    Creatinine, Ser 1.80 (*)    GFR calc non Af Amer 27 (*)    GFR calc Af  Amer 31 (*)    All other components within normal limits  URINALYSIS, ROUTINE W REFLEX MICROSCOPIC - Abnormal; Notable for the following components:   Color, Urine AMBER (*)    APPearance CLOUDY (*)    Hgb urine dipstick LARGE (*)    Ketones, ur 5  (*)    Protein, ur 100 (*)    Leukocytes,Ua MODERATE (*)    RBC / HPF >50 (*)    Bacteria, UA MANY (*)    All other components within normal limits  CULTURE, BLOOD (ROUTINE X 2)  CULTURE, BLOOD (ROUTINE X 2)  URINE CULTURE  SARS CORONAVIRUS 2 (TAT 6-24 HRS)  LACTIC ACID, PLASMA  LACTIC ACID, PLASMA    EKG None  Radiology Ct Abdomen Pelvis Wo Contrast  Result Date: 11/18/2018 CLINICAL DATA:  Acute, diffuse abdominal pain. Known cecal mass eroding into the vagina on the right with a vaginal fistula. EXAM: CT ABDOMEN AND PELVIS WITHOUT CONTRAST TECHNIQUE: Multidetector CT imaging of the abdomen and pelvis was performed following the standard protocol without IV contrast. COMPARISON:  10/20/2018 FINDINGS: Lower chest: Small amount of linear atelectasis or scarring in the right lower lobe. Normal sized heart. Hepatobiliary: Cholecystectomy clips. Stable too small liver cysts. Pancreas: Unremarkable. No pancreatic ductal dilatation or surrounding inflammatory changes. Spleen: Normal in size without focal abnormality. Adrenals/Urinary Tract: Normal appearing adrenal glands. Stable marked dilatation of the right renal collecting system and moderate dilatation of the ureter to the level of the cecal mass. Stable marked thinning of the right renal parenchyma. Stable lower pole left renal parapelvic cyst. The sickle mass extends to the dome of the urinary bladder on the right with possible invasion of the bladder and tethering of the bladder PICC Stomach/Bowel: Small hiatal hernia. Previously noted cecal mass abutting the vagina and urinary bladder on the right without a fat plane separating them. No bowel dilatation. Surgically absent appendix. Vascular/Lymphatic: Mild atheromatous arterial calcifications. No enlarged lymph nodes. Reproductive: Surgically absent uterus. Thickening of the vaginal cuff superiorly on the right with an appearance compatible with invasion of the vaginal cuff by the adjacent  cecal mass. No adnexal masses are seen. Other: Very small proximal left inguinal hernia containing fat. Musculoskeletal: Mild lumbar spine facet degenerative changes. No evidence of bony metastatic disease. IMPRESSION: 1. No acute abnormality. 2. Previously noted cecal mass abutting and probably of invading the vaginal cuff and urinary bladder on the right. 3. Stable marked right hydronephrosis and hydroureter to the level of the obstructing cecal mass with associated marked renal atrophy. 4. Small hiatal hernia. Electronically Signed   By: Claudie Revering M.D.   On: 11/06/2018 12:01    Procedures Procedures (including critical care time)  Medications Ordered in ED Medications  sodium chloride 0.9 % bolus 1,000 mL (0 mLs Intravenous Stopped 11/03/2018 1138)  cefTRIAXone (ROCEPHIN) 1 g in sodium chloride 0.9 % 100 mL IVPB (0 g Intravenous Stopped 12/03/2018 1303)  acetaminophen (TYLENOL) tablet 1,000 mg (1,000 mg Oral Given 11/19/2018 1424)     Initial Impression / Assessment and Plan / ED Course  I have reviewed the triage vital signs and the nursing notes.  Pertinent labs & imaging results that were available during my care of the patient were reviewed by me and considered in my medical decision making (see chart for details).  Patient with known intestinal-vaginal fistula, presenting from skilled nursing facility with worsening feculent vaginal discharge and bleeding reported over the past 3 days.  Patient is demented but facility felt that  she was more uncomfortable than usual.  Patient does report burning and irritation in the perineal area, history otherwise limited.  Had recent admission with evaluations from gynecologic oncology, general surgery, and radiation oncology, but felt patient was not a candidate for these interventions.  On arrival patient is ill-appearing, pale, pleasantly demented.  She does not have focal abdominal pain but has significant skin breakdown and persistent feculent vaginal  discharge.  Labs significant for leukocytosis, no lactic acid elevation.  I&O cath reveals UTI, patient also has slight bump in renal function up from 1.2 to 1.80.  After IV fluids patient does have better appearance, but will start IV Rocephin and plan for admission.  COVID test pending.  Abdominal CT shows unchanged cecal mass invading into the right vaginal cuff, this is also causing hydronephrosis and hydroureter which could certainly worsen UTI.   Case discussed with Dr. Posey Pronto with [facility] who will see and admit the patient.  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

## 2018-11-13 DIAGNOSIS — D490 Neoplasm of unspecified behavior of digestive system: Secondary | ICD-10-CM | POA: Diagnosis present

## 2018-11-13 DIAGNOSIS — C7911 Secondary malignant neoplasm of bladder: Secondary | ICD-10-CM | POA: Diagnosis present

## 2018-11-13 DIAGNOSIS — Z7189 Other specified counseling: Secondary | ICD-10-CM

## 2018-11-13 DIAGNOSIS — Z90710 Acquired absence of both cervix and uterus: Secondary | ICD-10-CM | POA: Diagnosis not present

## 2018-11-13 DIAGNOSIS — N822 Fistula of vagina to small intestine: Secondary | ICD-10-CM | POA: Diagnosis not present

## 2018-11-13 DIAGNOSIS — G893 Neoplasm related pain (acute) (chronic): Secondary | ICD-10-CM

## 2018-11-13 DIAGNOSIS — Z833 Family history of diabetes mellitus: Secondary | ICD-10-CM | POA: Diagnosis not present

## 2018-11-13 DIAGNOSIS — M1990 Unspecified osteoarthritis, unspecified site: Secondary | ICD-10-CM | POA: Diagnosis present

## 2018-11-13 DIAGNOSIS — C7982 Secondary malignant neoplasm of genital organs: Secondary | ICD-10-CM | POA: Diagnosis present

## 2018-11-13 DIAGNOSIS — C7B00 Secondary carcinoid tumors, unspecified site: Secondary | ICD-10-CM | POA: Diagnosis not present

## 2018-11-13 DIAGNOSIS — I469 Cardiac arrest, cause unspecified: Secondary | ICD-10-CM | POA: Diagnosis not present

## 2018-11-13 DIAGNOSIS — I129 Hypertensive chronic kidney disease with stage 1 through stage 4 chronic kidney disease, or unspecified chronic kidney disease: Secondary | ICD-10-CM | POA: Diagnosis present

## 2018-11-13 DIAGNOSIS — F329 Major depressive disorder, single episode, unspecified: Secondary | ICD-10-CM | POA: Diagnosis present

## 2018-11-13 DIAGNOSIS — N823 Fistula of vagina to large intestine: Secondary | ICD-10-CM | POA: Diagnosis present

## 2018-11-13 DIAGNOSIS — C189 Malignant neoplasm of colon, unspecified: Secondary | ICD-10-CM | POA: Diagnosis present

## 2018-11-13 DIAGNOSIS — F0280 Dementia in other diseases classified elsewhere without behavioral disturbance: Secondary | ICD-10-CM | POA: Diagnosis present

## 2018-11-13 DIAGNOSIS — N183 Chronic kidney disease, stage 3 unspecified: Secondary | ICD-10-CM | POA: Diagnosis present

## 2018-11-13 DIAGNOSIS — N3000 Acute cystitis without hematuria: Secondary | ICD-10-CM

## 2018-11-13 DIAGNOSIS — Z515 Encounter for palliative care: Secondary | ICD-10-CM | POA: Diagnosis present

## 2018-11-13 DIAGNOSIS — Z823 Family history of stroke: Secondary | ICD-10-CM | POA: Diagnosis not present

## 2018-11-13 DIAGNOSIS — G309 Alzheimer's disease, unspecified: Secondary | ICD-10-CM | POA: Diagnosis present

## 2018-11-13 DIAGNOSIS — K219 Gastro-esophageal reflux disease without esophagitis: Secondary | ICD-10-CM | POA: Diagnosis present

## 2018-11-13 DIAGNOSIS — Z20828 Contact with and (suspected) exposure to other viral communicable diseases: Secondary | ICD-10-CM | POA: Diagnosis present

## 2018-11-13 DIAGNOSIS — N390 Urinary tract infection, site not specified: Secondary | ICD-10-CM | POA: Diagnosis present

## 2018-11-13 DIAGNOSIS — E785 Hyperlipidemia, unspecified: Secondary | ICD-10-CM | POA: Diagnosis present

## 2018-11-13 DIAGNOSIS — Z66 Do not resuscitate: Secondary | ICD-10-CM | POA: Diagnosis present

## 2018-11-13 DIAGNOSIS — N136 Pyonephrosis: Secondary | ICD-10-CM | POA: Diagnosis present

## 2018-11-13 DIAGNOSIS — N179 Acute kidney failure, unspecified: Secondary | ICD-10-CM | POA: Diagnosis present

## 2018-11-13 DIAGNOSIS — G249 Dystonia, unspecified: Secondary | ICD-10-CM | POA: Diagnosis present

## 2018-11-13 DIAGNOSIS — N828 Other female genital tract fistulae: Secondary | ICD-10-CM | POA: Diagnosis present

## 2018-11-13 MED ORDER — HYDROMORPHONE BOLUS VIA INFUSION
1.0000 mg | INTRAVENOUS | Status: DC | PRN
  Filled 2018-11-13: qty 1

## 2018-11-13 MED ORDER — SODIUM CHLORIDE 0.9 % IV SOLN
1.0000 g | INTRAVENOUS | Status: DC
  Administered 2018-11-13: 1 g via INTRAVENOUS
  Filled 2018-11-13: qty 1
  Filled 2018-11-13: qty 10

## 2018-11-13 MED ORDER — HYDROMORPHONE HCL 1 MG/ML IJ SOLN
1.0000 mg | Freq: Once | INTRAMUSCULAR | Status: AC
  Administered 2018-11-13: 1 mg via INTRAVENOUS
  Filled 2018-11-13: qty 1

## 2018-11-13 MED ORDER — HYDROMORPHONE HCL 1 MG/ML IJ SOLN
1.0000 mg | INTRAMUSCULAR | Status: DC | PRN
  Administered 2018-11-13 (×2): 1 mg via INTRAVENOUS
  Filled 2018-11-13 (×2): qty 1

## 2018-11-13 MED ORDER — SODIUM CHLORIDE 0.9 % IV SOLN
0.5000 mg/h | INTRAVENOUS | Status: DC
  Administered 2018-11-13: 0.5 mg/h via INTRAVENOUS
  Filled 2018-11-13: qty 5

## 2018-11-13 MED ORDER — FENTANYL CITRATE (PF) 100 MCG/2ML IJ SOLN: 12.5000 ug | INTRAMUSCULAR | Status: DC | PRN

## 2018-11-13 MED ORDER — HYDROMORPHONE HCL 1 MG/ML IJ SOLN
1.0000 mg | INTRAMUSCULAR | Status: DC | PRN
  Administered 2018-11-13: 1 mg via INTRAVENOUS
  Filled 2018-11-13: qty 1

## 2018-11-13 NOTE — Progress Notes (Addendum)
PROGRESS NOTE  Alyssa Macdonald [id number] DOB: 1946-02-02 DOA: 11/30/2018 PCP: Bartholome Bill, MD  HPI/Recap of past 24 hours: Alyssa Macdonald is a 73 y.o. female with Past medical history of Alzheimer's dementia, hysterectomy, cecal high-grade carcinoma with rectovaginal fistula, chronic kidney disease stage III. Patient presents from memory care unit SNF with complaints of vaginal burning and discharge. Patient was recently hospitalized and discharged on 10/27/2018 after extensive evaluation about rectovaginal fistula in the hospital. General surgery and GYN oncology were consulted who both felt that the patient is not a surgical candidate for any intervention given the extensive nature of the surgery required to treat her condition. Radiation oncology was consulted who felt that treating her with radiation would lead to tumor shrinkage and can increase her fistula and therefore I would not recommend that unless she has extensive vaginal bleeding. Discussed with patient's son there plan was after reaching memory care unit eventually discussed with hospice. Today the patient is brought to the hospital because of bleeding from the vaginal area and pain which was ongoing for last 3 days ago. No fever no chills. Reportedly patient is more confused than her baseline.  ED Course: Patient underwent CT abdomen and pelvis secondary to abdominal pain.  CT scan shows previously noted cecal mass invading the vaginal cuff and urinary bladder as well as market right hydronephrosis and hydroureter.  At her baseline ambulates with assistance and wheelchair He is not independent for most of her ADL;  Does not manages her medication on her own.  11/13/18: Patient was seen and examined at bedside this morning.  She is in a lot of pain.  Increase Dilaudid to 1 mg every 2 hours with minimal improvement.  Called the patient's daughter unsuccessfully, call went to voicemail which is full.  Palliative  care team has been consulted to assist in hospice placement and pain management.  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 Patient not a candidate for any surgical intervention. Radiation oncology felt that palliative radiation will likely cause more damage and worsen her fistula.  Recommend palliative radiation only in the setting of severe vaginal bleeding. Patient presents with possible UTI as well as significant irritation of the perineal area. She is in significant pain despite IV pain medication Dilaudid 1 mg every 2 hours Family contacted and successfully, will call again Palliative care team has been consulted to assist with hospice placement and pain management  Gram-negative rod UTI Urine culture taken on 12/01/2018 showed greater than 100,000 colonies of gram-negative rods We will treat her with Rocephin empirically for symptoms control ID and sensitivities pending  Essential tremor Continue home medications  Dementia. For now we will continue Aricept and Namenda but low threshold to discontinue this medication.  Chronic anxiety/depression Continue Lexapro.  Bladder spasm. PRN oxybutynin.  Diarrhea. Patient likely to have high output diarrhea as she has fistula coming from cecum. Imodium as needed.  Nutrition: Regular diet DVT Prophylaxis: Comfort care  Advance goals of care discussion: DNR comfort care  Consults: Palliative care  Family Communication:  None at bedside.  Will call her daughter again and will obtain her son's number.    Objective: Vitals:   11/18/2018 1203 11/22/2018 1643 11/03/2018 2035 11/13/18 0608  BP:  138/77 121/69 115/74  Pulse:  78 62 89  Resp:  16 17  17  Temp: 98.6 F (37 C) 98.3 F (36.8 C) 98 F (36.7 C)  98.1 F (36.7 C)  TempSrc: Rectal Oral Oral Oral  SpO2:  98% 98% 94%  Weight:  76.1 kg    Height:  5\' 6"  (1.676 m)     No intake or output data in the 24 hours ending 11/13/18 1233 Filed Weights   12/01/2018 1643  Weight: 76.1 kg    Exam:  . General: 73 y.o. year-old female well developed appears uncomfortable due to severe perineal pain.  Alert in the setting of dementia . Cardiovascular: Regular rate and rhythm with no rubs or gallops.  No thyromegaly or JVD noted.   Marland Kitchen Respiratory: Clear to auscultation with no wheezes or rales. Good inspiratory effort. . Abdomen: Soft nontender nondistended with normal bowel sounds x4 quadrants. . Musculoskeletal: Trace lower extremity edema. 2/4 pulses in all 4 extremities. Marland Kitchen Psychiatry: Unable to assess mood due to pain   Data Reviewed: CBC: Recent Labs  Lab 11/19/2018 0941  WBC 13.0*  NEUTROABS 10.4*  HGB 13.3  HCT 41.1  MCV 89.2  PLT [id number]*   Basic Metabolic Panel: Recent Labs  Lab 11/11/2018 0941  NA 135  K 4.2  CL 102  CO2 18*  GLUCOSE 113*  BUN 41*  CREATININE 1.80*  CALCIUM 9.7   GFR: Estimated Creatinine Clearance: 28.1 mL/min (A) (by C-G formula based on SCr of 1.8 mg/dL (H)). Liver Function Tests: Recent Labs  Lab 12/01/2018 0941  AST 21  ALT 13  ALKPHOS 82  BILITOT 0.6  PROT 7.1  ALBUMIN 4.2   No results for input(s): LIPASE, AMYLASE in the last 168 hours. No results for input(s): AMMONIA in the last 168 hours. Coagulation Profile: No results for input(s): INR, PROTIME in the last 168 hours. Cardiac Enzymes: No results for input(s): CKTOTAL, CKMB, CKMBINDEX, TROPONINI in the last 168 hours. BNP (last 3 results) No results for input(s): PROBNP in the last 8760 hours. HbA1C: No results for input(s): HGBA1C in the last 72 hours. CBG: No results for input(s): GLUCAP in the last 168 hours. Lipid Profile: No results for input(s): CHOL, HDL, LDLCALC, TRIG, CHOLHDL, LDLDIRECT in the last 72 hours. Thyroid  Function Tests: No results for input(s): TSH, T4TOTAL, FREET4, T3FREE, THYROIDAB in the last 72 hours. Anemia Panel: No results for input(s): VITAMINB12, FOLATE, FERRITIN, TIBC, IRON, RETICCTPCT in the last 72 hours. Urine analysis:    Component Value Date/Time   COLORURINE AMBER (A) 11/21/2018 0941   APPEARANCEUR CLOUDY (A) 11/20/2018 0941   LABSPEC 1.021 11/16/2018 0941   PHURINE 5.0 11/29/2018 0941   GLUCOSEU NEGATIVE 11/11/2018 0941   HGBUR LARGE (A) 11/22/2018 0941   BILIRUBINUR NEGATIVE 11/27/2018 0941   BILIRUBINUR negative 04/18/2013 1223   KETONESUR 5 (A) 11/28/2018 0941   PROTEINUR 100 (A) 11/11/2018 0941   UROBILINOGEN negative 04/18/2013 1223   UROBILINOGEN 1.0 01/31/2008 0717   NITRITE NEGATIVE 11/16/2018 0941   LEUKOCYTESUR MODERATE (A) 11/22/2018 0941   Sepsis Labs: @LABRCNTIP (procalcitonin:4,lacticidven:4)  ) Recent Results (from the past 240 hour(s))  Urine culture     Status: Abnormal (Preliminary result)   Collection Time: 11/10/2018  9:33 AM   Specimen: Urine, Random  Result Value Ref Range Status   Specimen Description   Final    URINE, RANDOM Performed at [facility], [place] [address] [place], [place] [address]    Special Requests   Final    NONE Performed at [facility], [address] [place], [place] [address]    Culture >=100,000 COLONIES/mL GRAM NEGATIVE RODS (A)  Final   Report Status PENDING  Incomplete  Blood culture (routine x 2)     Status: None (Preliminary result)   Collection Time: 11/18/2018 10:43 AM   Specimen: BLOOD RIGHT ARM  Result Value Ref Range Status   Specimen Description BLOOD RIGHT ARM  Final   Special Requests   Final    BOTTLES DRAWN AEROBIC AND ANAEROBIC Blood Culture adequate volume Performed at [facility], [place] [address], [place], [place] [address]    Culture PENDING  Incomplete   Report Status PENDING  Incomplete  Blood culture (routine x 2)     Status: None  (Preliminary result)   Collection Time: 11/27/2018 10:43 AM   Specimen: BLOOD LEFT ARM  Result Value Ref Range Status   Specimen Description BLOOD LEFT ARM  Final   Special Requests   Final    BOTTLES DRAWN AEROBIC AND ANAEROBIC Blood Culture adequate volume Performed at [facility], [place] [address], [place], [place] [address]    Culture PENDING  Incomplete   Report Status PENDING  Incomplete  SARS CORONAVIRUS 2 (TAT 6-24 HRS) Nasopharyngeal Nasopharyngeal Swab     Status: None   Collection Time: 11/16/2018  2:20 PM   Specimen: Nasopharyngeal Swab  Result Value Ref Range Status   SARS Coronavirus 2 NEGATIVE NEGATIVE Final    Comment: (NOTE) SARS-CoV-2 target nucleic acids are NOT DETECTED. The SARS-CoV-2 RNA is generally detectable in upper and lower respiratory specimens during the acute phase of infection. Negative results do not preclude SARS-CoV-2 infection, do not rule out co-infections with other pathogens, and should not be used as the sole basis for treatment or other patient management decisions. Negative results must be combined with clinical observations, patient history, and epidemiological information. The expected result is Negative. Fact Sheet for Patients: SugarRoll.be Fact Sheet for Healthcare Providers: https://www.woods-mathews.com/ This test is not yet approved or cleared by the [place] FDA and  has been authorized for detection and/or diagnosis of SARS-CoV-2 by FDA under an Emergency Use Authorization (EUA). This EUA will remain  in effect (meaning this test can be used) for the duration of the COVID-19 declaration under Section 56 4(b)(1) of the Act, 21 U.S.C. section 360bbb-3(b)(1), unless the authorization is terminated or revoked sooner. Performed at [facility], [place] [address], [place], [place] [address]       Studies: No results found.  Scheduled Meds: . donepezil  10 mg Oral QHS  .  escitalopram  10 mg Oral QHS  . memantine  7 mg Oral Daily  . nystatin-triamcinolone  1 application Topical QID  . primidone  75 mg Oral Daily  . traZODone  50 mg Oral QHS    Continuous Infusions:   LOS: 0 days     Kayleen Memos, MD [facility] Pager [phone]  If 7PM-7AM, please contact night-coverage [email or web address] Password [facility] 11/13/2018, 12:33 PM

## 2018-11-13 NOTE — Consult Note (Signed)
Consultation Note Date: 11/13/2018   Patient Name: Alyssa Macdonald  DOB: 1945/03/15  MRN: [id number]  Age / Sex: 73 y.o., female  PCP: Bartholome Bill, MD Referring Physician: Kayleen Memos, DO  Reason for Consultation: Disposition, Establishing goals of care, Hospice Evaluation, Non pain symptom management and Pain control  HPI/Patient Profile: 73 y.o. female  with past medical history of Alzheimer's dementia, hysterectomy, cecal high-grade carcinoma with rectovaginal fistula, chronic kidney disease who lives in memory care unit and was admitted on 11/19/2018 with pain related to rectovaginal fistula, right-sided hydroureter with hydronephrosis, UTI, and AKI.   Clinical Assessment and Goals of Care: I met today with patient's son, Alyssa Macdonald, at the bedside.  I introduced palliative care as specialized medical care for people living with serious illness. It focuses on providing relief from the symptoms and stress of a serious illness. The goal is to improve quality of life for both the patient and the family.  We discussed clinical course as well as wishes moving forward in regard to care plan for his mother.  Values and goals of care important to patient and family were attempted to be elicited.  We discussed difference between a aggressive medical intervention path and a palliative, comfort focused care path.    Alyssa Macdonald reports talking to Dr. Posey Pronto on admission and wants to ensure that his mother's comfort is the primary focus moving forward.  He reports understanding that she has very bad disease with colovaginal fistula and we discussed my concern that her pain still remains poorly controlled.  We discussed that she is used 4 mg of IV Dilaudid in the last 6 hours and still with inadequate pain control.  I discussed with him that it is very possible that the amount of medication that would allow his mother to be  out of pain may also cause her to be more sleepy.  He reports that this would be both his family and his mother's preference if she were to be able to understand her situation.  Discussed that her intake will also be a large factor in determining prognosis (she is not taking in anything other than sips at this point).  Additionally, we talked about the fact that continuation of IV antibiotics may prolong her life, however, I am not sure that is going to add to her comfort.  We discussed plan to see how she does clinically overnight and reassess this in the a.m.  Concept of Hospice and Palliative Care were discussed  Questions and concerns addressed.   PMT will continue to support holistically.  SUMMARY OF RECOMMENDATIONS   -DNR/DNI.  Full comfort care -Pain: Alyssa Macdonald has been receiving regular doses of Dilaudid without relief of her pain.  She has had 4 doses of 1 mg of IV Dilaudid in the last 6 hours and still remains uncomfortable during my examination.  We will plan for initiation of continuous infusion of Dilaudid with additional breakthrough dosing of 1 mg every 30 minutes as needed.  Continue to titrate pain  medication as needed for adequate symptom relief.  Comfort is the main goal moving forward. -Discussed plan to reassess in the morning and based upon her clinical course as well as how much she is able to take it as far as nutrition and hydration will make a determination on if she may best be served by consideration for transition to residential hospice. -Plan to assess again in a.m.  Code Status/Advance Care Planning:  DNR   Symptom Management:   As above  Palliative Prophylaxis:   Frequent Pain Assessment  Additional Recommendations (Limitations, Scope, Preferences):  Full Comfort Care  Psycho-social/Spiritual:   Desire for further Chaplaincy support:no  Additional Recommendations: Education on Hospice  Prognosis:   Unable to determine as this will depend upon her  status after working to better control her pain.  I do think it is very likely that we may be at a point where we are looking at a period of days to weeks and we had initial discussion regarding potential for residential hospice today.  Discharge Planning: To Be Determined      Primary Diagnoses: Present on Admission:  UTI (urinary tract infection)   I have reviewed the medical record, interviewed the patient and family, and examined the patient. The following aspects are pertinent.  Past Medical History:  Diagnosis Date   Alzheimer's disease ([place])    Cervical dystonia    Complication of anesthesia    slow to awaken   Depression    Endometriosis    s/p total hysterectomy   Esophageal reflux    Essential and other specified forms of tremor    GERD (gastroesophageal reflux disease)    Hyperlipidemia    Hypertension    Hypopotassemia    Osteoarthrosis involving, or with mention of more than one site, but not specified as generalized, site unspecified(715.80)    Osteoarthrosis involving, or with mention of more than one site, but not specified as generalized, site unspecified(715.80)    Other disorders of bone and cartilage(733.99)    Tremors of nervous system    Trigger finger    Social History   Socioeconomic History   Marital status: Widowed    Spouse name: Alyssa Macdonald   Number of children: 2   Years of education: 14   Highest education level: Not on file  Occupational History   Occupation: RETIRED   Scientist, product/process development strain: Not on file   Food insecurity    Worry: Not on file    Inability: Not on file   Transportation needs    Medical: Not on file    Non-medical: Not on file  Tobacco Use   Smoking status: Never Smoker   Smokeless tobacco: Never Used  Substance and Sexual Activity   Alcohol use: Yes    Comment: Occasional,one glass of wine weekly   Drug use: No   Sexual activity: Not Currently    Partners: Male    Lifestyle   Physical activity    Days per week: Not on file    Minutes per session: Not on file   Stress: Not on file  Relationships   Social connections    Talks on phone: Not on file    Gets together: Not on file    Attends religious service: Not on file    Active member of club or organization: Not on file    Attends meetings of clubs or organizations: Not on file    Relationship status: Not on file  Other Topics Concern  Not on file  Social History Narrative   Marital Status:Widowed Alyssa Macdonald)    Children:  Son Clare Gandy) Daughter [name])    Pets: Dog Terri Piedra)   Living Situation: Lives alone   Occupation: Retired     Education: Geophysicist/field seismologist in [place] Use/Exposure:  None    Alcohol Use:  Occasional 1-2 per week   Drug Use:  None   Diet:  Regular   Exercise:  None   Hobbies: Reading, Quilting   Patient is right handed                     Family History  Problem Relation Age of Onset   CVA Mother    Alzheimer's disease Mother    Diabetes Father    Heart disease Father    Parkinson's disease Paternal Uncle    Cataracts Sister    Breast cancer Neg Hx    Scheduled Meds:  donepezil  10 mg Oral QHS   escitalopram  10 mg Oral QHS   memantine  7 mg Oral Daily   nystatin-triamcinolone  1 application Topical QID   primidone  75 mg Oral Daily   traZODone  50 mg Oral QHS   Continuous Infusions:  cefTRIAXone (ROCEPHIN)  IV 1 g (11/13/18 1344)   HYDROmorphone     PRN Meds:.acetaminophen **OR** acetaminophen, diphenhydrAMINE, glycopyrrolate **OR** glycopyrrolate **OR** glycopyrrolate, haloperidol **OR** haloperidol **OR** haloperidol lactate, HYDROmorphone, HYDROmorphone (DILAUDID) injection, loperamide, LORazepam **OR** LORazepam **OR** LORazepam, LORazepam, magic mouthwash, ondansetron **OR** ondansetron (ZOFRAN) IV, oxybutynin, oxyCODONE **OR** oxyCODONE, polyvinyl alcohol Medications Prior to Admission:  Prior to Admission  medications   Medication Sig Start Date End Date Taking? Authorizing Provider  acetaminophen (TYLENOL) 325 MG tablet Take 650 mg by mouth every 4 (four) hours as needed for mild pain or fever.   Yes [provider]  atorvastatin (LIPITOR) 10 MG tablet Take 10 mg by mouth every evening.   Yes [provider]  donepezil (ARICEPT) 10 MG tablet Take 1 tablet (10 mg total) by mouth at bedtime. 02/15/17  Yes Star Age, MD  escitalopram (LEXAPRO) 10 MG tablet Take 10 mg by mouth at bedtime.   Yes [provider]  Melatonin 5 MG TABS Take 5 mg by mouth at bedtime.   Yes [provider]  meloxicam (MOBIC) 15 MG tablet Take 15 mg by mouth daily.   Yes [provider]  memantine (NAMENDA XR) 7 MG CP24 24 hr capsule Take 7 mg by mouth daily.  08/10/17  Yes [provider]  nystatin-triamcinolone (MYCOLOG II) cream Apply 1 application topically 3 (three) times daily. Apply to vaginal and perineal area 10/28/18  Yes Dessa Phi, DO  primidone (MYSOLINE) 50 MG tablet Take 50 mg by mouth daily.   Yes [provider]  tolterodine (DETROL LA) 2 MG 24 hr capsule Take 2 mg by mouth daily.   Yes [provider]  traZODone (DESYREL) 50 MG tablet Take 50 mg by mouth at bedtime.  05/23/15  Yes [provider]   Allergies  Allergen Reactions   Zocor [Simvastatin] Other (See Comments)    Myalgia   Review of Systems  Gastrointestinal: Positive for abdominal distention.  Neurological: Positive for weakness.  Psychiatric/Behavioral: Positive for confusion.   Physical Exam General: Alert, awake, in moderate distress from pain.  Frail and chronically ill-appearing. Heart: Regular rate and rhythm. No murmur appreciated. Lungs: Good air movement, clear Abdomen: Soft, nontender, nondistended, positive bowel sounds.  Ext:  Some lower extremity edema Skin: Warm and dry  Vital Signs: BP 115/74 (BP Location: Left Arm)    Pulse 89    Temp  98.1 F (36.7 C) (Oral)    Resp 17    Ht 5' 6"  (1.676 m)    Wt 76.1 kg    SpO2 94%    BMI 27.08 kg/m  Pain Scale: 0-10   Pain Score: 10-Worst pain ever   SpO2: SpO2: 94 % O2 Device:SpO2: 94 % O2 Flow Rate: .   IO: Intake/output summary: No intake or output data in the 24 hours ending 11/13/18 1633  LBM: Last BM Date: 11/13/18 Baseline Weight: Weight: 76.1 kg Most recent weight: Weight: 76.1 kg     Palliative Assessment/Data:   Flowsheet Rows     Most Recent Value  Intake Tab  Referral Department  Hospitalist  Unit at Time of Referral  Oncology Unit  Palliative Care Primary Diagnosis  Cancer  Date Notified  12/03/2018  Palliative Care Type  New Palliative care  Reason for referral  Clarify Goals of Care, Pain, Non-pain Symptom  Date of Admission  12/02/2018  Date first seen by Palliative Care  11/13/18  # of days Palliative referral response time  1 Day(s)  # of days IP prior to Palliative referral  0  Clinical Assessment  Palliative Performance Scale Score  20%  Psychosocial & Spiritual Assessment  Palliative Care Outcomes  Patient/Family meeting held?  Yes  Who was at the meeting?  Son      Time In: 1430 Time Out: 1530 Time Total: 60 Greater than 50%  of this time was spent counseling and coordinating care related to the above assessment and plan.  Signed by: Micheline Rough, MD   Please contact Palliative Medicine Team phone at [phone] for questions and concerns.  For individual provider: See Shea Evans

## 2018-11-14 DIAGNOSIS — N823 Fistula of vagina to large intestine: Secondary | ICD-10-CM

## 2018-11-14 LAB — URINE CULTURE: Culture: >=100000 — AB

## 2018-11-17 LAB — CULTURE, BLOOD (ROUTINE X 2)
Culture: NO GROWTH
Culture: NO GROWTH
Special Requests: ADEQUATE
Special Requests: ADEQUATE

## 2018-12-04 NOTE — Progress Notes (Signed)
Alyssa Macdonald, comfort care, found unresponsive this am. [name] expired at 0400 hour this morning, 10/10 2020 and pronounced by this nurse and Rocco Serene RN.

## 2018-12-04 NOTE — Discharge Summary (Signed)
Death Summary  Alyssa Macdonald XFG:[id number] DOB: 29-Oct-1945 DOA: 12-02-2018  PCP: Bartholome Bill, MD  Admit date: December 02, 2018 Date of Death: December 04, 2018 Time of Death: 0400 Notification: Bartholome Bill, MD notified of death of December 04, 2018   History of present illness:  Alyssa Macdonald a 73 y.o.femalewith Past medical history significant forAlzheimer's dementia, hysterectomy, cecal high-grade carcinoma with rectovaginal fistula, chronic kidney disease stage III who presented from memory care unit SNF with complaints of vaginal burning and discharge. Patient was recently hospitalized and discharged on 10/27/2018 after extensive evaluation about rectovaginal fistula in the hospital. General surgery and GYN oncology were consulted who both felt that the patient is not a surgical candidate for any intervention given the extensive nature of the surgery required to treat her condition. Radiation oncology was consulted who felt that treating her with radiation would lead to tumor shrinkage and can increase her fistula and therefore would not recommend unless she has extensive vaginal bleeding. Admitting physician, Dr. Posey Pronto, discussed with patient's son regarding goals of care. She was made comfort care by family on admission on 12-02-2018. Palliative care team was consulted to assist with hospice placement and for symptoms management. Dr. Domingo Cocking met with patient's son on 11/13/18. Ronalee Belts reports talking to Dr. Posey Pronto on admission and wants to ensure that his mother's comfort is the primary focus moving forward.  On 12-04-18 at 0400, the patient expired. She was pronounced by RN Wynetta Emery and RN Lineback.   Final Diagnoses:  1.   Cardiopulmonary arrest. 2.   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   The results of significant diagnostics from this hospitalization (including imaging, microbiology, ancillary and laboratory) are listed below for reference.     Significant Diagnostic Studies: Ct Abdomen Pelvis Wo Contrast  Result Date: 12-02-2018 CLINICAL DATA:  Acute, diffuse abdominal pain. Known cecal mass eroding into the vagina on the right with a vaginal fistula. EXAM: CT ABDOMEN AND PELVIS WITHOUT CONTRAST TECHNIQUE: Multidetector CT imaging of the abdomen and pelvis was performed following the standard protocol without IV contrast. COMPARISON:  10/20/2018 FINDINGS: Lower chest: Small amount of linear atelectasis or scarring in the right lower lobe. Normal sized heart. Hepatobiliary: Cholecystectomy clips. Stable too small liver cysts. Pancreas: Unremarkable. No pancreatic ductal dilatation or surrounding inflammatory changes. Spleen: Normal in size without focal abnormality. Adrenals/Urinary Tract: Normal appearing adrenal glands. Stable marked dilatation of the right renal collecting system and moderate dilatation of the ureter to the level of the cecal mass. Stable marked thinning of the right renal parenchyma. Stable lower pole left renal parapelvic cyst. The sickle mass extends to the dome of the urinary bladder on the right with possible invasion of the bladder and tethering of the bladder PICC Stomach/Bowel: Small hiatal hernia. Previously noted cecal mass abutting the vagina and urinary bladder on the right without a fat plane separating them. No bowel dilatation. Surgically absent appendix. Vascular/Lymphatic: Mild atheromatous arterial calcifications. No enlarged lymph nodes. Reproductive: Surgically absent uterus. Thickening of the vaginal cuff superiorly on the right with an appearance compatible with invasion of the vaginal cuff by the adjacent cecal mass. No adnexal masses are seen. Other: Very small proximal left inguinal hernia containing fat. Musculoskeletal: Mild lumbar spine facet degenerative changes. No evidence of bony metastatic disease. IMPRESSION: 1. No acute abnormality. 2. Previously noted cecal mass abutting and probably of  invading the vaginal cuff and urinary bladder on the right. 3. Stable marked right hydronephrosis and hydroureter to the level of  the obstructing cecal mass with associated marked renal atrophy. 4. Small hiatal hernia. Electronically Signed   By: Claudie Revering M.D.   On: 11/15/2018 12:01   Ct Pelvis W Contrast  Result Date: 10/19/2018 CLINICAL DATA:  73 year old female with burning in the region of the inner thigh. EXAM: CT PELVIS WITH CONTRAST TECHNIQUE: Multidetector CT imaging of the pelvis was performed using the standard protocol following the bolus administration of intravenous contrast. CONTRAST:  74m OMNIPAQUE IOHEXOL 300 MG/ML  SOLN COMPARISON:  CT of the abdomen pelvis dated 09/23/2018 FINDINGS: Urinary Tract: There is dilatation of the visualized right ureter likely secondary to obstruction by the right adnexal/pelvic mass as seen on the CT of 09/23/2018. The visualized left ureter appears unremarkable. Bowel: Rectal contrast is noted. There is sigmoid diverticulosis without active inflammatory changes. There is tethering of multiple loops of small bowel to the right adnexal mass consistent with adhesions there is mild dilatation of small-bowel loops in the right hemipelvis measuring up to 3.5 cm indicative of a degree obstruction. Evidence of prior small bowel surgery with anastomotic suture in the left hemipelvis. Vascular/Lymphatic: The abdominal aorta and IVC are grossly unremarkable. No adenopathy. Reproductive: Hysterectomy. There is a 4.9 x 4.6 x 5.0 cm soft tissue mass in the region the right adnexa contiguous with the vaginal cuff. There is complex fluid or air and debris within the center of this mass likely extending from the vagina. There is irregularity and nodularity of the border of these mass with desmoplastic changes and tethering of the adjacent small bowel loops. Other: None no perirectal abscess or inflammatory changes. No fluid collection or soft tissue air. Musculoskeletal:  Osteopenia. No acute osseous pathology. Left S2-S3 Tarlov cyst. IMPRESSION: 1. No fluid collection, abscess or soft tissue air in the perirectal region or medial thighs. 2. Right adnexal soft tissue contiguous with the vaginal cuff similar to prior CT. There is associated tethering of the adjacent small bowel loops with probable degree of small-bowel obstruction as well as obstruction of the distal right ureter. MRI may provide better evaluation of this mass. Electronically Signed   By: AAnner CreteM.D.   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 CONTRAST:  862mOMNIPAQUE IOHEXOL 300 MG/ML SOLN, 3013mMNIPAQUE IOHEXOL 300 MG/ML SOLN COMPARISON:  CT, 09/23/2018 FINDINGS: Lower chest: Linear atelectasis at the lung bases.  Acute findings. Hepatobiliary: DomLeretha Dykes the liver excluded from the field of view. Liver normal in overall size and attenuation. 1.5 cm low-density lesion, segment 4 B. 1.0 cm low-density lesion, segment 2. These are stable consistent with cysts. Subtle low-density lesion noted near the liver dome, segment 7, also likely a cyst. This was more defined as a cyst on the prior CT. No other liver lesions. Status post cholecystectomy. No bile duct dilation. Pancreas: Unremarkable. No pancreatic ductal dilatation or surrounding inflammatory changes. Spleen: Normal in size without focal abnormality. Adrenals/Urinary Tract: No adrenal masses. Right ureter is obstructed an irregular mass along right adnexa. This causes marked  right hydronephrosis and moderate right hydroureter. No left hydronephrosis. Left ureter normal  in course and in caliber. Stable lower pole left renal cyst. Significant right renal cortical thinning. There is less enhancement of the right kidney compared to the left as well as delayed excretion on the right. Renal findings are stable from prior CT. Stomach/Bowel: There is an irregular mass that appears centered on the upper cecum and lower ascending colon, extending into the right adnexal soft tissues abutting the right superior vaginal cuff and pelvic loops of small bowel. Mass measures approximately 6.2 x 4.2 cm transversely by 6.1 cm superior to inferior. There is a projection of contrast, from what appears to be the cecum lying in the inferior pelvis, extending into the right pelvic mass. There is also a small bubble of air within the right pelvic mass. A small amount of contrast and a single bubble of air extends within the mass to the right vaginal cuff consistent with a fistula. There are numerous sigmoid colon diverticula without evidence of diverticulitis. No other bowel masses. No wall thickening and no inflammation. There is mild distention loops of mid to distal small bowel with air-fluid levels. No small bowel wall thickening or adjacent inflammation. Stomach is unremarkable. Vascular/Lymphatic: No discrete enlarged lymph nodes. Aortic atherosclerosis. Reproductive: Status post hysterectomy. Other: No hernia.  No ascites. Musculoskeletal: No fracture or acute finding. No osteoblastic or osteolytic lesions. IMPRESSION: 1. No significant change when compared to the previous CT scan. Bowel contrast better defines the right pelvic mass. 2. Some contrast as well as a small amount of air lies within the mass. This appears extraluminal. The defect or fistula appears to arise from the right side of the cecum, which lies in the inferior pelvis. 3. The right pelvic mass appears centered on the upper cecum and  lower ascending colon, consistent a colon carcinoma. Mass is contiguous with the right vaginal cuff. There is a small amount of contrast and a small bubble of air that extends to the right vaginal cuff from the pelvic mass consistent with a fistula. 4. Mass measures approximately 6.2 x 4.2 x 6.1 cm. 5. No evidence of metastatic disease. Electronically Signed   By: Lajean Manes M.D.   On: 10/20/2018 16:06   Ct Biopsy  Result Date: 10/20/2018 INDICATION: GYN malignancy, right vaginal cuff mass with possible adjacent fistula EXAM: CT-GUIDED CORE BIOPSY RIGHT VAGINAL CUFF MASS MEDICATIONS: 1% LIDOCAINE LOCAL ANESTHESIA/SEDATION: 2.0 mg IV Versed; 50 mcg IV Fentanyl Moderate Sedation Time:  12 minutes The patient was continuously monitored during the procedure by the interventional radiology nurse under my direct supervision. PROCEDURE: The procedure, risks, benefits, and alternatives were explained to the patient. Questions regarding the procedure were encouraged and answered. The patient understands and consents to the procedure. Previous imaging reviewed. Patient position prone. Noncontrast localization CT performed. The right vaginal soft tissue mass was localized and marked for a posterior trans gluteal approach. Under sterile conditions and local anesthesia, a 17 gauge coaxial guide needle was advanced from right posterior trans gluteal approach to the right vaginal cuff mass. Needle position confirmed with CT. 3 18 gauge core biopsies obtained. Samples were intact and non fragmented. These were placed in formalin. Needle removed. Postprocedure imaging demonstrates hematoma. Patient tolerated the procedure well without complication. Vital sign monitoring by nursing staff during the procedure will continue as patient is in the special procedures unit for post procedure observation. FINDINGS: The images document guide needle placement within the right vaginal cuff mass. Post biopsy images demonstrate no  hemorrhage or hematoma. COMPLICATIONS: None immediate. IMPRESSION: Successful  CT-guided core biopsy of the right vaginal cuff mass Electronically Signed   By: Jerilynn Mages.  Shick M.D.   On: 10/20/2018 09:38    Microbiology: Recent Results (from the past 240 hour(s))  Urine culture     Status: Abnormal   Collection Time: 11/10/2018  9:33 AM   Specimen: Urine, Random  Result Value Ref Range Status   Specimen Description   Final    URINE, RANDOM Performed at [facility] [address], [place], [place] [address]    Special Requests   Final    NONE Performed at [facility], [place] [address] [place], [place] [address]    Culture (A)  Final    >=100,000 COLONIES/mL ESCHERICHIA COLI Confirmed Extended Spectrum Beta-Lactamase Producer (ESBL).  In bloodstream infections from ESBL organisms, carbapenems are preferred over piperacillin/tazobactam. They are shown to have a lower risk of mortality.    Report Status 11-15-2018 FINAL  Final   Organism ID, Bacteria ESCHERICHIA COLI (A)  Final      Susceptibility   Escherichia coli - MIC*    AMPICILLIN >=32 RESISTANT Resistant     CEFAZOLIN >=64 RESISTANT Resistant     CEFTRIAXONE >=64 RESISTANT Resistant     CIPROFLOXACIN >=4 RESISTANT Resistant     GENTAMICIN <=1 SENSITIVE Sensitive     IMIPENEM <=0.25 SENSITIVE Sensitive     NITROFURANTOIN <=16 SENSITIVE Sensitive     TRIMETH/SULFA <=20 SENSITIVE Sensitive     AMPICILLIN/SULBACTAM >=32 RESISTANT Resistant     PIP/TAZO <=4 SENSITIVE Sensitive     Extended ESBL POSITIVE Resistant     * >=100,000 COLONIES/mL ESCHERICHIA COLI  Blood culture (routine x 2)     Status: None (Preliminary result)   Collection Time: 11/21/2018 10:43 AM   Specimen: BLOOD RIGHT ARM  Result Value Ref Range Status   Specimen Description BLOOD RIGHT ARM  Final   Special Requests   Final    BOTTLES DRAWN AEROBIC AND ANAEROBIC Blood Culture adequate volume   Culture   Final    NO GROWTH 2  DAYS Performed at [facility], [address], [place], [place] [address]    Report Status PENDING  Incomplete  Blood culture (routine x 2)     Status: None (Preliminary result)   Collection Time: 11/29/2018 10:43 AM   Specimen: BLOOD LEFT ARM  Result Value Ref Range Status   Specimen Description BLOOD LEFT ARM  Final   Special Requests   Final    BOTTLES DRAWN AEROBIC AND ANAEROBIC Blood Culture adequate volume   Culture   Final    NO GROWTH 2 DAYS Performed at [facility], [place] [address], [place],  [address]    Report Status PENDING  Incomplete  SARS CORONAVIRUS 2 (TAT 6-24 HRS) Nasopharyngeal Nasopharyngeal Swab     Status: None   Collection Time: 11/22/2018  2:20 PM   Specimen: Nasopharyngeal Swab  Result Value Ref Range Status   SARS Coronavirus 2 NEGATIVE NEGATIVE Final    Comment: (NOTE) SARS-CoV-2 target nucleic acids are NOT DETECTED. The SARS-CoV-2 RNA is generally detectable in upper and lower respiratory specimens during the acute phase of infection. Negative results do not preclude SARS-CoV-2 infection, do not rule out co-infections with other pathogens, and should not be used as the sole basis for treatment or other patient management decisions. Negative results must be combined with clinical observations, patient history, and epidemiological information. The expected result is Negative. Fact Sheet for Patients: SugarRoll.be Fact  Sheet for Healthcare Providers: https://www.woods-mathews.com/ This test is not yet approved or cleared by the [place] FDA and  has been authorized for detection and/or diagnosis of SARS-CoV-2 by FDA under an Emergency Use Authorization (EUA). This EUA will remain  in effect (meaning this test can be used) for the duration of the COVID-19 declaration under Section 56 4(b)(1) of the Act, 21 U.S.C. section 360bbb-3(b)(1), unless the authorization is terminated or revoked  sooner. Performed at [facility], [place] [address] [place], [place] [address]      Labs: Basic Metabolic Panel: Recent Labs  Lab 11/24/2018 0941  NA 135  K 4.2  CL 102  CO2 18*  GLUCOSE 113*  BUN 41*  CREATININE 1.80*  CALCIUM 9.7   Liver Function Tests: Recent Labs  Lab 11/28/2018 0941  AST 21  ALT 13  ALKPHOS 82  BILITOT 0.6  PROT 7.1  ALBUMIN 4.2   No results for input(s): LIPASE, AMYLASE in the last 168 hours. No results for input(s): AMMONIA in the last 168 hours. CBC: Recent Labs  Lab 12/03/2018 0941  WBC 13.0*  NEUTROABS 10.4*  HGB 13.3  HCT 41.1  MCV 89.2  PLT 646*   Cardiac Enzymes: No results for input(s): CKTOTAL, CKMB, CKMBINDEX, TROPONINI in the last 168 hours. D-Dimer No results for input(s): DDIMER in the last 72 hours. BNP: Invalid input(s): POCBNP CBG: No results for input(s): GLUCAP in the last 168 hours. Anemia work up No results for input(s): VITAMINB12, FOLATE, FERRITIN, TIBC, IRON, RETICCTPCT in the last 72 hours. Urinalysis    Component Value Date/Time   COLORURINE AMBER (A) 11/28/2018 0941   APPEARANCEUR CLOUDY (A) 11/16/2018 0941   LABSPEC 1.021 11/09/2018 0941   PHURINE 5.0 11/16/2018 0941   GLUCOSEU NEGATIVE 11/13/2018 0941   HGBUR LARGE (A) 11/30/2018 0941   BILIRUBINUR NEGATIVE 12/03/2018 0941   BILIRUBINUR negative 04/18/2013 1223   KETONESUR 5 (A) 11/23/2018 0941   PROTEINUR 100 (A) 11/07/2018 0941   UROBILINOGEN negative 04/18/2013 1223   UROBILINOGEN 1.0 01/31/2008 0717   NITRITE NEGATIVE 12/03/2018 0941   LEUKOCYTESUR MODERATE (A) 11/06/2018 0941   Sepsis Labs Invalid input(s): PROCALCITONIN,  WBC,  LACTICIDVEN     SIGNED:  Kayleen Memos, MD  [facility] Nov 25, 2018, 4:17 PM Pager   If 7PM-7AM, please contact night-coverage [email or web address] Password [email or web address]

## 2018-12-04 DEATH — deceased

## 2019-06-01 IMAGING — CT CT HEAD W/O CM
3 series · 15 of 47 positions shown, 18 images · non-contrast
Comparison: MRI of the brain performed 03/27/2016

CLINICAL DATA: Acute onset of dizziness and nausea.

EXAM:
CT HEAD WITHOUT CONTRAST
TECHNIQUE: Contiguous axial images were obtained from the base of the skull
through the vertex without intravenous contrast.

[Series 2: head wo · axial · 0.47mm/px · z∈[+1277,+1412]mm · 9 of 33 slices shown, 12 images]
[im 3/33  brain]
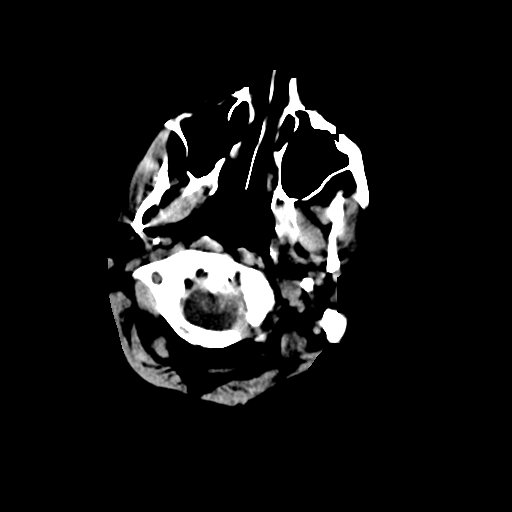
[im 3/33  bone]
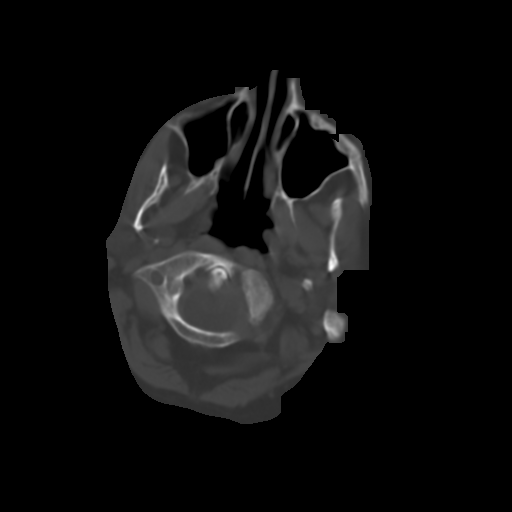
[im 6/33  brain]
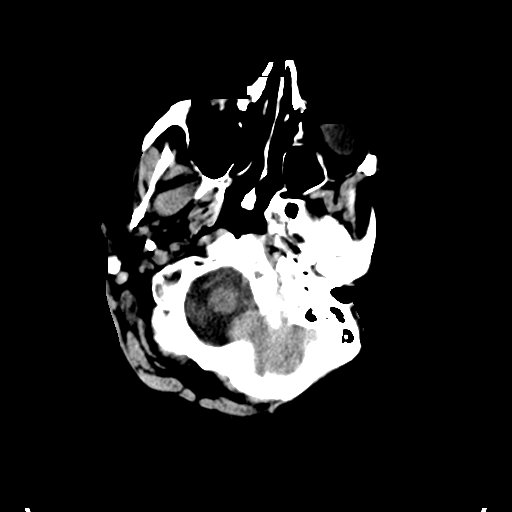
[im 9/33  brain]
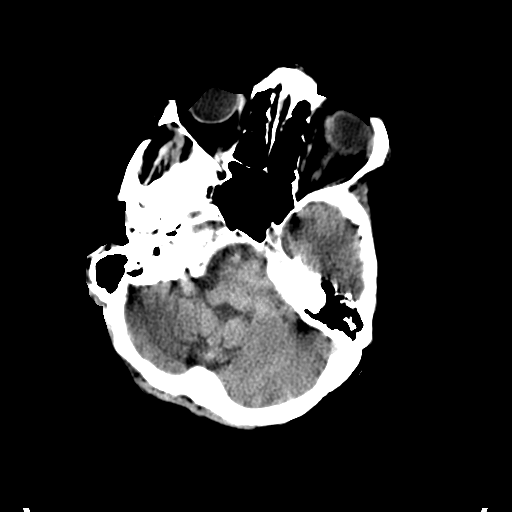
[im 13/33  brain]
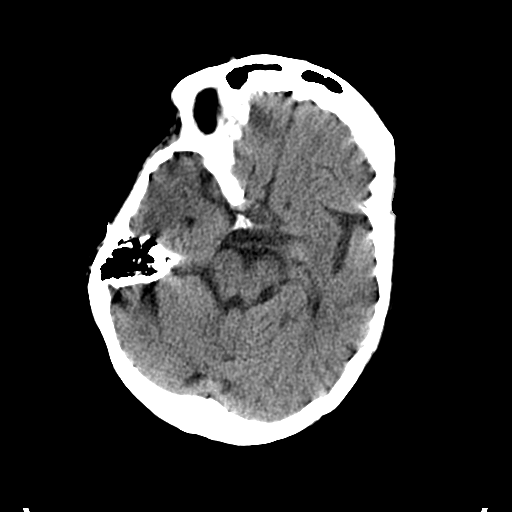
[im 17/33  brain]
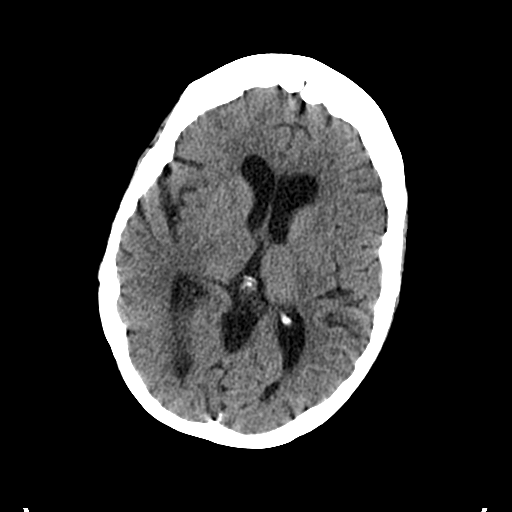
[im 17/33  bone]
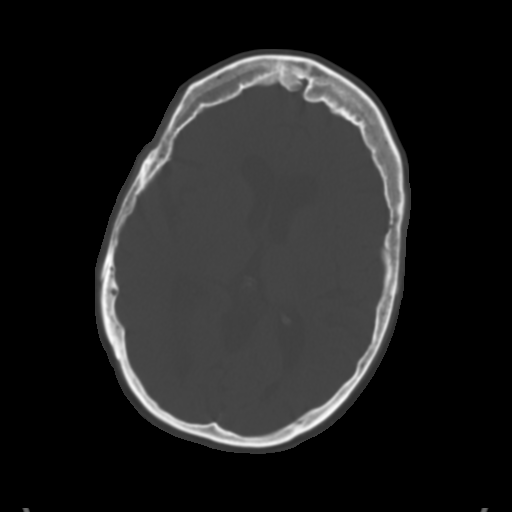
[im 20/33  brain]
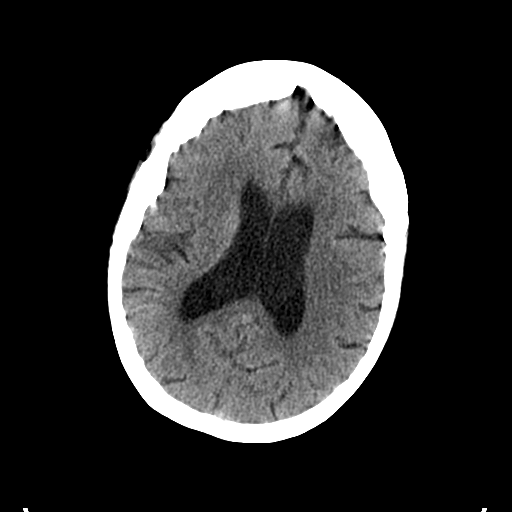
[im 24/33  brain]
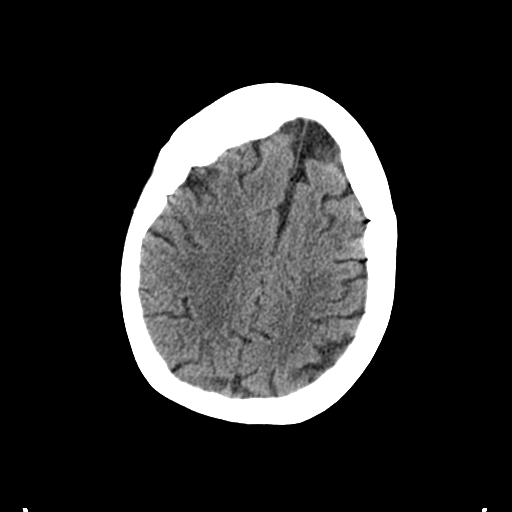
[im 27/33  brain]
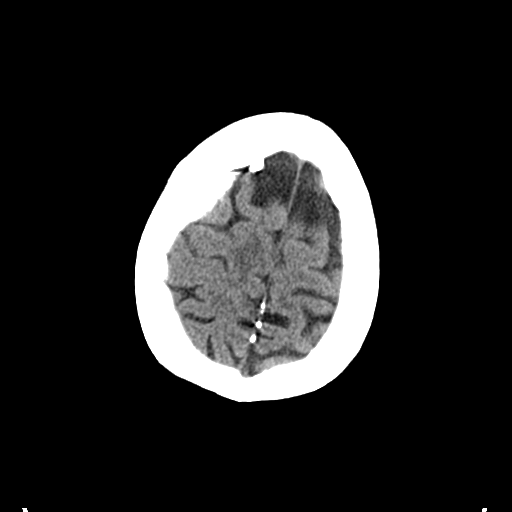
[im 30/33  brain]
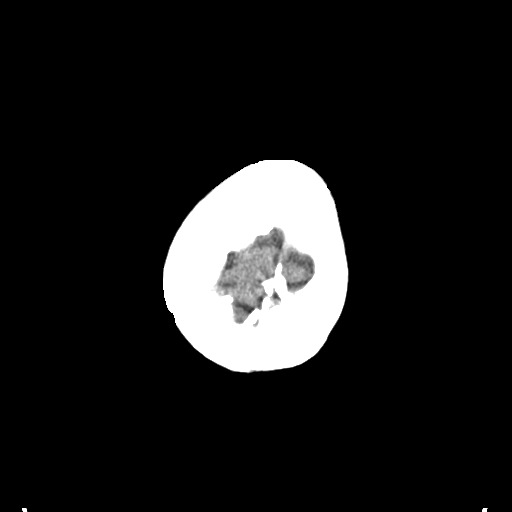
[im 30/33  bone]
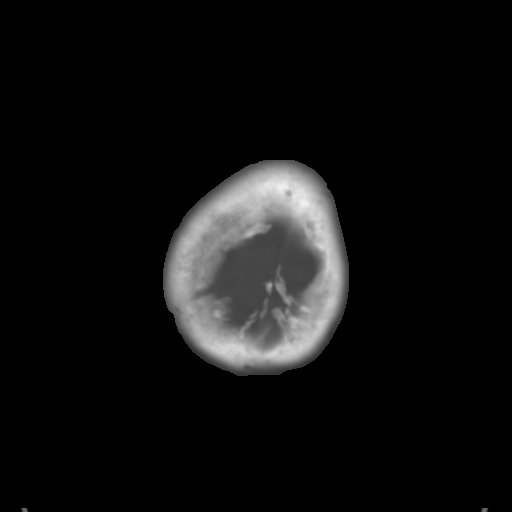

[Series 4: coronal soft tissue · coronal · 0.28mm/px · 3 of 62 slices shown]
[im 21/62  brain]
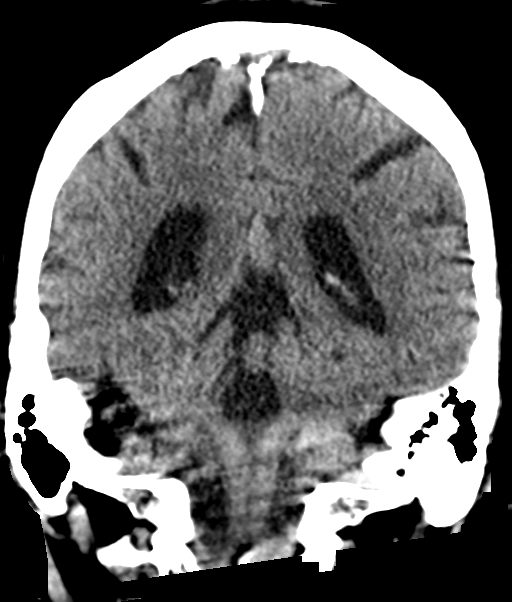
[im 28/62  brain]
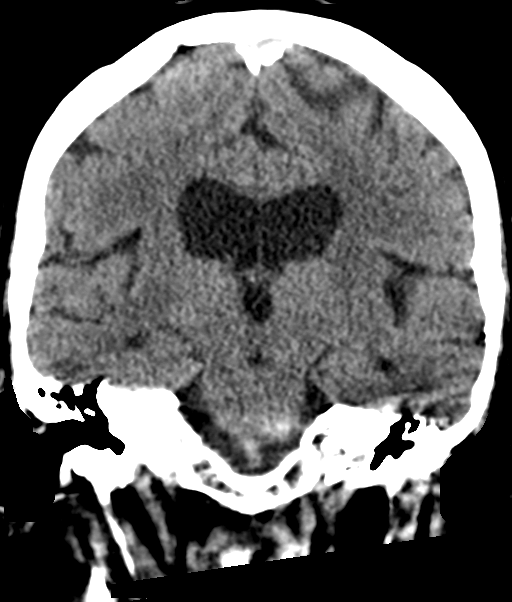
[im 34/62  brain]
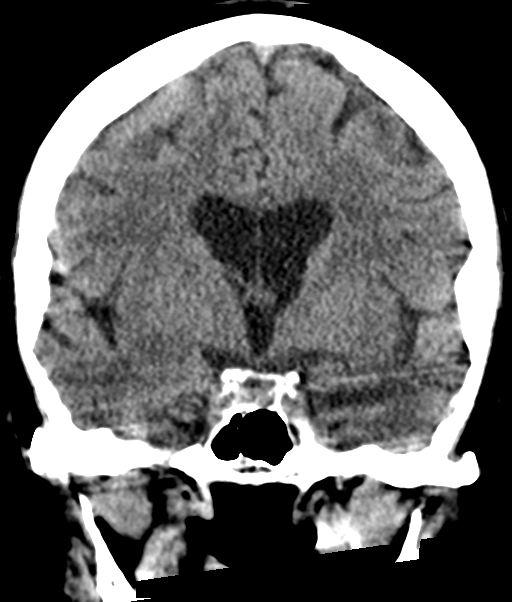

[Series 5: sagittal soft tissue · sagittal · 0.33mm/px · 3 of 49 slices shown]
[im 19/49  brain]
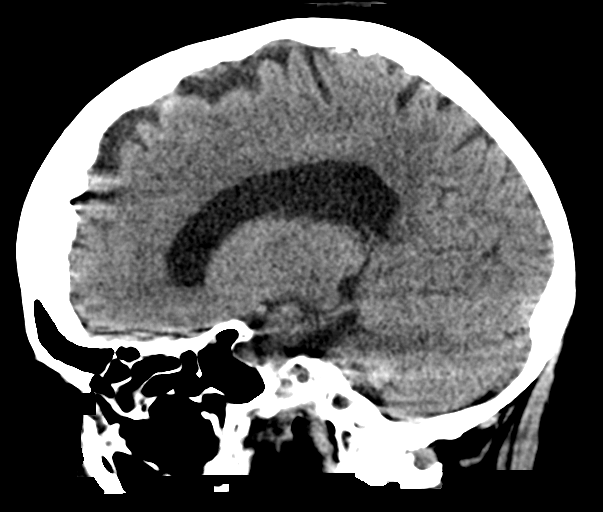
[im 25/49  brain]
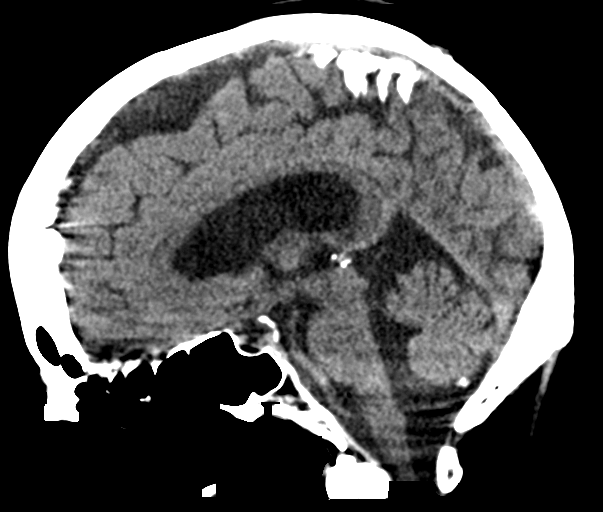
[im 31/49  brain]
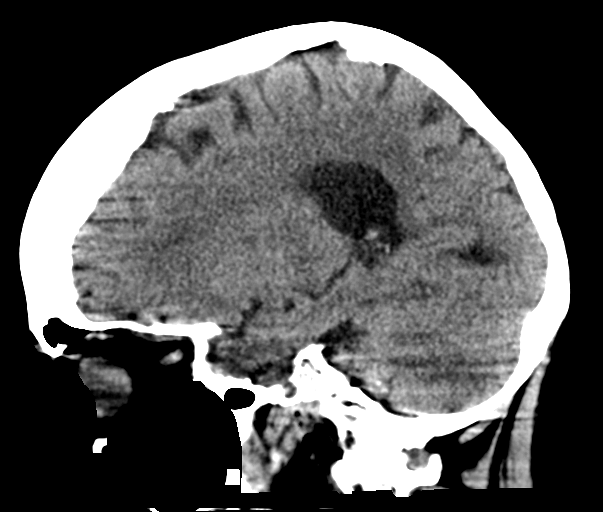

[15 of 47 positions shown; findings below may reference images not displayed]

FINDINGS: Brain: No evidence of acute infarction, hemorrhage, hydrocephalus,
extra-axial collection or mass lesion / mass effect.

Prominence of the ventricles and sulci reflects mild cortical volume
loss. Mild cerebellar atrophy is noted. Scattered periventricular
and subcortical matter change likely reflects small vessel ischemic
microangiopathy.

The brainstem and fourth ventricle are within normal limits. The
basal ganglia are unremarkable in appearance. The cerebral
hemispheres demonstrate grossly normal gray-white differentiation.
No mass effect or midline shift is seen.

Vascular: No hyperdense vessel or unexpected calcification.

Skull: There is no evidence of fracture; visualized osseous
structures are unremarkable in appearance.

Sinuses/Orbits: The orbits are within normal limits. The paranasal
sinuses and mastoid air cells are well-aerated.

Other: No significant soft tissue abnormalities are seen.
IMPRESSION: 1. No acute intracranial pathology seen on CT.
2. Mild cortical volume loss and scattered small vessel ischemic
microangiopathy.
